# Patient Record
Sex: Female | Born: 1990 | Race: White | Hispanic: No | Marital: Married | Smoking: Former smoker
Health system: Southern US, Community
[De-identification: ages and names within clinical notes are randomized; demographics above are authoritative.]

## PROBLEM LIST (undated history)

## (undated) DIAGNOSIS — F32A Depression, unspecified: Secondary | ICD-10-CM

## (undated) DIAGNOSIS — F419 Anxiety disorder, unspecified: Secondary | ICD-10-CM

## (undated) DIAGNOSIS — F909 Attention-deficit hyperactivity disorder, unspecified type: Secondary | ICD-10-CM

## (undated) DIAGNOSIS — F329 Major depressive disorder, single episode, unspecified: Secondary | ICD-10-CM

## (undated) DIAGNOSIS — N1370 Vesicoureteral-reflux, unspecified: Secondary | ICD-10-CM

## (undated) DIAGNOSIS — N390 Urinary tract infection, site not specified: Secondary | ICD-10-CM

## (undated) DIAGNOSIS — G43909 Migraine, unspecified, not intractable, without status migrainosus: Secondary | ICD-10-CM

## (undated) HISTORY — DX: Urinary tract infection, site not specified: N39.0

## (undated) HISTORY — DX: Vesicoureteral-reflux, unspecified: N13.70

## (undated) HISTORY — DX: Anxiety disorder, unspecified: F41.9

## (undated) HISTORY — DX: Attention-deficit hyperactivity disorder, unspecified type: F90.9

## (undated) HISTORY — DX: Migraine, unspecified, not intractable, without status migrainosus: G43.909

## (undated) HISTORY — DX: Depression, unspecified: F32.A

## (undated) HISTORY — PX: CERVICAL BIOPSY  W/ LOOP ELECTRODE EXCISION: SUR135

## (undated) HISTORY — DX: Major depressive disorder, single episode, unspecified: F32.9

---
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
## 2015-02-01 ENCOUNTER — Encounter: Payer: Self-pay | Admitting: Emergency Medicine

## 2015-02-01 ENCOUNTER — Emergency Department
Admission: EM | Admit: 2015-02-01 | Discharge: 2015-02-01 | Disposition: A | Discharge status: Home / Self Care | Payer: Managed Care, Other (non HMO) | Attending: Emergency Medicine | Admitting: Emergency Medicine

## 2015-02-01 DIAGNOSIS — N390 Urinary tract infection, site not specified: Secondary | ICD-10-CM | POA: Insufficient documentation

## 2015-02-01 DIAGNOSIS — Z3202 Encounter for pregnancy test, result negative: Secondary | ICD-10-CM | POA: Insufficient documentation

## 2015-02-01 DIAGNOSIS — R1031 Right lower quadrant pain: Secondary | ICD-10-CM | POA: Diagnosis present

## 2015-02-01 LAB — URINALYSIS COMPLETE WITH MICROSCOPIC (ARMC ONLY)
Bilirubin Urine: NEGATIVE
GLUCOSE, UA: NEGATIVE mg/dL
Ketones, ur: NEGATIVE mg/dL
Leukocytes, UA: NEGATIVE
Nitrite: POSITIVE — AB
Protein, ur: NEGATIVE mg/dL
Specific Gravity, Urine: 1.017 (ref 1.005–1.030)
pH: 6 (ref 5.0–8.0)

## 2015-02-01 LAB — POCT PREGNANCY, URINE: PREG TEST UR: NEGATIVE

## 2015-02-01 MED ORDER — HYDROCODONE-ACETAMINOPHEN 5-325 MG PO TABS
1.0000 | ORAL_TABLET | Freq: Once | ORAL | Status: AC
  Administered 2015-02-01: 1 via ORAL
  Filled 2015-02-01: qty 1

## 2015-02-01 MED ORDER — CEPHALEXIN 500 MG PO CAPS: 500.0000 mg | ORAL_CAPSULE | Freq: Three times a day (TID) | ORAL | Status: DC

## 2015-02-01 MED ORDER — SODIUM CHLORIDE 0.9 % IV BOLUS (SEPSIS)
1000.0000 mL | Freq: Once | INTRAVENOUS | Status: AC
  Administered 2015-02-01: 1000 mL via INTRAVENOUS

## 2015-02-01 MED ORDER — PHENAZOPYRIDINE HCL 200 MG PO TABS
ORAL_TABLET | ORAL | Status: AC
  Administered 2015-02-01: 200 mg via ORAL
  Filled 2015-02-01: qty 1

## 2015-02-01 MED ORDER — PHENAZOPYRIDINE HCL 200 MG PO TABS: 200.0000 mg | ORAL_TABLET | Freq: Three times a day (TID) | ORAL | Status: AC | PRN

## 2015-02-01 MED ORDER — PHENAZOPYRIDINE HCL 100 MG PO TABS
100.0000 mg | ORAL_TABLET | Freq: Once | ORAL | Status: DC
  Filled 2015-02-01: qty 1

## 2015-02-01 MED ORDER — DEXTROSE 5 % IV SOLN
1.0000 g | Freq: Once | INTRAVENOUS | Status: AC
  Administered 2015-02-01: 1 g via INTRAVENOUS
  Filled 2015-02-01: qty 10

## 2015-02-01 MED ORDER — PHENAZOPYRIDINE HCL 200 MG PO TABS
200.0000 mg | ORAL_TABLET | Freq: Once | ORAL | Status: AC
  Administered 2015-02-01: 200 mg via ORAL

## 2015-02-01 NOTE — Discharge Instructions (Signed)
Please seek medical attention for any high fevers, chest pain, shortness of breath, change in behavior, persistent vomiting, bloody stool or any other new or concerning symptoms. ° ° °Urinary Tract Infection °Urinary tract infections (UTIs) can develop anywhere along your urinary tract. Your urinary tract is your body's drainage system for removing wastes and extra water. Your urinary tract includes two kidneys, two ureters, a bladder, and a urethra. Your kidneys are a pair of bean-shaped organs. Each kidney is about the size of your fist. They are located below your ribs, one on each side of your spine. °CAUSES °Infections are caused by microbes, which are microscopic organisms, including fungi, viruses, and bacteria. These organisms are so small that they can only be seen through a microscope. Bacteria are the microbes that most commonly cause UTIs. °SYMPTOMS  °Symptoms of UTIs may vary by age and gender of the patient and by the location of the infection. Symptoms in young women typically include a frequent and intense urge to urinate and a painful, burning feeling in the bladder or urethra during urination. Older women and men are more likely to be tired, shaky, and weak and have muscle aches and abdominal pain. A fever may mean the infection is in your kidneys. Other symptoms of a kidney infection include pain in your back or sides below the ribs, nausea, and vomiting. °DIAGNOSIS °To diagnose a UTI, your caregiver will ask you about your symptoms. Your caregiver will also ask you to provide a urine sample. The urine sample will be tested for bacteria and white blood cells. White blood cells are made by your body to help fight infection. °TREATMENT  °Typically, UTIs can be treated with medication. Because most UTIs are caused by a bacterial infection, they usually can be treated with the use of antibiotics. The choice of antibiotic and length of treatment depend on your symptoms and the type of bacteria causing  your infection. °HOME CARE INSTRUCTIONS °· If you were prescribed antibiotics, take them exactly as your caregiver instructs you. Finish the medication even if you feel better after you have only taken some of the medication. °· Drink enough water and fluids to keep your urine clear or pale yellow. °· Avoid caffeine, tea, and carbonated beverages. They tend to irritate your bladder. °· Empty your bladder often. Avoid holding urine for long periods of time. °· Empty your bladder before and after sexual intercourse. °· After a bowel movement, women should cleanse from front to back. Use each tissue only once. °SEEK MEDICAL CARE IF:  °· You have back pain. °· You develop a fever. °· Your symptoms do not begin to resolve within 3 days. °SEEK IMMEDIATE MEDICAL CARE IF:  °· You have severe back pain or lower abdominal pain. °· You develop chills. °· You have nausea or vomiting. °· You have continued burning or discomfort with urination. °MAKE SURE YOU:  °· Understand these instructions. °· Will watch your condition. °· Will get help right away if you are not doing well or get worse. °  °This information is not intended to replace advice given to you by your health care provider. Make sure you discuss any questions you have with your health care provider. °  °Document Released: 01/17/2005 Document Revised: 12/29/2014 Document Reviewed: 05/18/2011 °Elsevier Interactive Patient Education ©2016 [employer] ° °

## 2015-02-01 NOTE — ED Notes (Signed)
MD at bedside. 

## 2015-02-01 NOTE — ED Provider Notes (Signed)
[facility] Emergency Department Provider Note   ____________________________________________  Time seen: 1315  I have reviewed the triage vital signs and the nursing notes.   HISTORY  Chief Complaint Abdominal Pain   History limited by: Not Limited   HPI Eileen Davis is a 24 y.o. female who presents to the emergency department today because of right sided lower abdominal pain. Patient states the pain started yesterday morning and has gradually gotten worse. She describes it as a sharp pain. It is worse with ambulation or lying flat. She took an aleve yesterday with minimal relief. No fevers. Has had a bad odor to her urine. History of ureteral reflux on the right side. Went to [facility] where blood work showed leukocytosis and urine showed positive nitrite and WBC.   History reviewed. No pertinent past medical history.  There are no active problems to display for this patient.   History reviewed. No pertinent past surgical history.  No current outpatient prescriptions on file.  Allergies Imitrex  No family history on file.  Social History Social History  Substance Use Topics  . Smoking status: Never Smoker   . Smokeless tobacco: None  . Alcohol Use: Yes    Review of Systems  Constitutional: Negative for fever. Cardiovascular: Negative for chest pain. Respiratory: Negative for shortness of breath. Gastrointestinal: Right lower quadrant pain Genitourinary: Negative for dysuria. Musculoskeletal: Negative for back pain. Skin: Negative for rash. Neurological: Negative for headaches, focal weakness or numbness.  10-point ROS otherwise negative.  ____________________________________________   PHYSICAL EXAM:  VITAL SIGNS: ED Triage Vitals  Enc Vitals Group     BP 02/01/15 1213 124/93 mmHg     Pulse Rate 02/01/15 1213 101     Resp 02/01/15 1213 20     Temp 02/01/15 1213 98.5 F (36.9 C)     Temp src --      SpO2  02/01/15 1213 98 %     Weight 02/01/15 1213 110 lb (49.896 kg)     Height 02/01/15 1213 5\' 2"  (1.575 m)     Head Cir --      Peak Flow --      Pain Score 02/01/15 1211 8   Constitutional: Alert and oriented. Well appearing and in no distress. Eyes: Conjunctivae are normal. PERRL. Normal extraocular movements. ENT   Head: Normocephalic and atraumatic.   Nose: No congestion/rhinnorhea.   Mouth/Throat: Mucous membranes are moist.   Neck: No stridor. Hematological/Lymphatic/Immunilogical: No cervical lymphadenopathy. Cardiovascular: Normal rate, regular rhythm.  No murmurs, rubs, or gallops. Respiratory: Normal respiratory effort without tachypnea nor retractions. Breath sounds are clear and equal bilaterally. No wheezes/rales/rhonchi. Gastrointestinal: Soft and minimally tender to palpation in the RLQ. Negative rovsings. No rebound. No guarding. Genitourinary: Deferred Musculoskeletal: Normal range of motion in all extremities. No joint effusions.  No lower extremity tenderness nor edema. Neurologic:  Normal speech and language. No gross focal neurologic deficits are appreciated. Speech is normal.  Skin:  Skin is warm, dry and intact. No rash noted. Psychiatric: Mood and affect are normal. Speech and behavior are normal. Patient exhibits appropriate insight and judgment.  ____________________________________________    LABS (pertinent positives/negatives)  Labs Reviewed  URINALYSIS COMPLETEWITH MICROSCOPIC ([facility] ONLY) - Abnormal; Notable for the following:    Color, Urine YELLOW (*)    APPearance HAZY (*)    Hgb urine dipstick 2+ (*)    Nitrite POSITIVE (*)    Bacteria, UA RARE (*)    Squamous Epithelial / LPF 0-5 (*)  All other components within normal limits  URINE CULTURE  POCT PREGNANCY, URINE     ____________________________________________   EKG  None  ____________________________________________     RADIOLOGY  None    ____________________________________________   PROCEDURES  Procedure(s) performed: None  Critical Care performed: No  ____________________________________________   INITIAL IMPRESSION / ASSESSMENT AND PLAN / ED COURSE  Pertinent labs & imaging results that were available during my care of the patient were reviewed by me and considered in my medical decision making (see chart for details).  Patient presented to the emergency department today from pediatrician because of concerns for right-sided abdominal pain. Lab work did show urine positive for nitrites and white blood cells. Patient does have a history of ureteral reflux on that side. Given these findings is that I think patient likely has a urinary tract infection. I did have a discussion with the patient and family about possibility of appendicitis however at this point given the positive urine findings including positive nitrite I think unlikely patient also has appendicitis. I did reexamine the patient prior to discharging the patient had less tenderness than on initial exam. Still without any Rovsings sign rebound or guarding. I did discuss strict appendicitis return precautions with the family and patient.  ____________________________________________   FINAL CLINICAL IMPRESSION(S) / ED DIAGNOSES  Final diagnoses:  UTI (lower urinary tract infection)     Nance Pear, MD 02/01/15 1818

## 2015-02-01 NOTE — ED Notes (Addendum)
Sent in from [place] with right sided abd . Had elevated wbc from office  States pain increases with ambulation

## 2015-02-03 LAB — URINE CULTURE: Culture: >=100000

## 2015-02-04 ENCOUNTER — Telehealth: Payer: Self-pay | Admitting: Emergency Medicine

## 2015-02-04 NOTE — ED Notes (Signed)
Attempted speak to a pharmacist at [facility] , was on hold for 25 min , left a message on there voice mail with information regarding the RX

## 2015-02-04 NOTE — ED Provider Notes (Addendum)
-----------------------------------------   5:36 PM on 02/04/2015 -----------------------------------------  The pharmacist brought to my attention this patient's urine culture which was obtained several days ago and is notable for E. Coli ESBL, resistant to penicillins, cephalosporins, and aztreonam.  However, it IS sensitive to Macrobid.  She was discharged with a 10 day course of Keflex.  Eileen Davis, the charge nurse, called the patient at home, and I spoke with her personally.  She states that she is feeling better since she was seen.  We gave her the option of coming back to the emergency department and possibly be admitted for IV antibiotics, or I suggested that, since she is established with a urologist, and since she is feeling better clinically, we could call in a prescription for Macrobid to her pharmacy which she should start taking immediately.  She prefers this option.  I encouraged her to follow up as soon as possible with her urologist to discuss the culture results and for further evaluation of her history of ureteral reflux.  She understands and agrees with the plan.  She also understands that she should return to the emergency department immediately if she worsens clinically.  I initially suggested that she continue taking the Keflex, but she prefers not to, and since we know the bacteria is resistant to cephalosporins, I told her that it is okay if she discontinues it.  Eileen Davis is calling the pharmacy to call in the following prescription at my request:  Nitrofurantoin 100 mg capsules, take 1 capsule twice daily for 14 days.  Dispense 20 capsules, no refills.  Hinda Kehr, MD 02/04/15 1750

## 2015-03-01 DIAGNOSIS — N1370 Vesicoureteral-reflux, unspecified: Secondary | ICD-10-CM | POA: Insufficient documentation

## 2015-03-01 DIAGNOSIS — N390 Urinary tract infection, site not specified: Secondary | ICD-10-CM | POA: Insufficient documentation

## 2015-03-15 ENCOUNTER — Ambulatory Visit (INDEPENDENT_AMBULATORY_CARE_PROVIDER_SITE_OTHER): Payer: Managed Care, Other (non HMO) | Admitting: Obstetrics and Gynecology

## 2015-03-15 ENCOUNTER — Encounter: Payer: Self-pay | Admitting: Obstetrics and Gynecology

## 2015-03-15 VITALS — BP 145/85 | HR 98 | Ht 61.0 in | Wt 112.8 lb

## 2015-03-15 DIAGNOSIS — Z01419 Encounter for gynecological examination (general) (routine) without abnormal findings: Secondary | ICD-10-CM

## 2015-03-15 DIAGNOSIS — Z124 Encounter for screening for malignant neoplasm of cervix: Secondary | ICD-10-CM | POA: Diagnosis not present

## 2015-03-15 DIAGNOSIS — Z113 Encounter for screening for infections with a predominantly sexual mode of transmission: Secondary | ICD-10-CM

## 2015-03-15 NOTE — Progress Notes (Signed)
GYNECOLOGY ANNUAL EXAM CLINIC VISIT  Subjective:    Eileen Davis is a 24 y.o. P0 female who presents for an annual exam. The patient is sexually active, currently 1 partner. GYN screening history: no prior history of gyn screening tests. The patient wears seatbelts: yes. The patient participates in regular exercise: no. Has the patient ever been transfused or tattooed?: no. The patient reports that there is not domestic violence in her life.   The patient has the following complaints today: 1) Has h/o recurrent UTIs due to vesicourethral reflux (right sided), started on Macrobid suppression ~ 1.5 months ago.  Notes that it is interfering with her  OCPs as she is now noting mid-cycle cramping, anxiety/depressive symptoms (previously well managed with OCPs).  Also concerned about effectiveness of OCP with current antibiotic and risk of pregnancy.  Menstrual History: OB History    Gravida Para Term Preterm AB TAB SAB Ectopic Multiple Living   0 0 0 0 0 0 0 0 0 0       Menarche age: 76  Patient's last menstrual period was 03/01/2015. Period Duration (Days): 3-4 Period Pattern: Regular Menstrual Flow: Light Dysmenorrhea: (!) Mild Dysmenorrhea Symptoms: Cramping, Nausea, Headache   Past Medical History  Diagnosis Date  . Depression   . Migraine   . Ureteral reflux 2005    Right side  . Anxiety and depression   . Recurrent UTI     History reviewed. No pertinent past surgical history.  Family History  Problem Relation Age of Onset  . Osteoporosis Mother   . Diabetes Father   . Hypertension Father     Social History   Social History  . Marital Status: Single    Spouse Name: N/A  . Number of Children: N/A  . Years of Education: N/A   Occupational History  . Not on file.   Social History Main Topics  . Smoking status: Never Smoker   . Smokeless tobacco: Not on file  . Alcohol Use: Yes  . Drug Use: No  . Sexual Activity: Yes    Birth Control/ Protection: Pill    Other Topics Concern  . Not on file   Social History Narrative    Outpatient Encounter Prescriptions as of 03/15/2015  Medication Sig Note  . FLUARIX QUADRIVALENT 0.5 ML injection TO BE ADMINISTERED BY PHARMACIST FOR IMMUNIZATION 03/15/2015: Received from: External Pharmacy  . nitrofurantoin, macrocrystal-monohydrate, (MACROBID) 100 MG capsule Take 100 mg by mouth. 03/15/2015: Received from: [facility]  . norgestimate-ethinyl estradiol (PREVIFEM) 0.25-35 MG-MCG tablet Take by mouth. 03/15/2015: Received from: [facility]  . phenazopyridine (PYRIDIUM) 200 MG tablet Take 1 tablet (200 mg total) by mouth 3 (three) times daily as needed for pain.    No facility-administered encounter medications on file as of 03/15/2015.    Allergies  Allergen Reactions  . Imitrex [Sumatriptan] Other (See Comments)    Had syncopal episode  . Sumatriptan Succinate Other (See Comments)    Had syncopal episode    Review of Systems Constitutional: negative for chills, fatigue, fevers and sweats Eyes: negative for irritation, redness and visual disturbance Ears, nose, mouth, throat, and face: negative for hearing loss, nasal congestion, snoring and tinnitus Respiratory: negative for asthma, cough, sputum Cardiovascular: negative for chest pain, dyspnea, exertional chest pressure/discomfort, irregular heart beat, palpitations and syncope Gastrointestinal: negative for abdominal pain, change in bowel habits, nausea and vomiting Genitourinary: positive for mid-cycle cramping, recurrent UTIs, ; negative for abnormal menstrual periods, genital lesions, sexual problems and vaginal  discharge, dysuria and urinary incontinence Integument/breast: negative for breast lump, breast tenderness and nipple discharge Hematologic/lymphatic: negative for bleeding and easy bruising Musculoskeletal:negative for back pain and muscle weakness Neurological: negative for dizziness, headaches, vertigo and  weakness Endocrine: negative for diabetic symptoms including polydipsia, polyuria and skin dryness Allergic/Immunologic: negative for hay fever and urticaria   Psychiatric: positive for anxiety and depressive symptoms, associated with menses; negative for - concentration difficulties, decreased libido, hallucinations or memory difficulties   Objective:    BP 145/85 mmHg  Pulse 98  Ht 5\' 1"  (1.549 m)  Wt 112 lb 12.8 oz (51.166 kg)  BMI 21.32 kg/m2  LMP 03/01/2015.    General Appearance:    Alert, cooperative, no distress, appears stated age  Head:    Normocephalic, without obvious abnormality, atraumatic  Eyes:    PERRL, conjunctiva/corneas clear, EOM's intact, both eyes  Ears:    Normal external ear canals, both ears  Nose:   Nares normal, septum midline, mucosa normal, no drainage or sinus tenderness  Throat:   Lips, mucosa, and tongue normal; teeth and gums normal  Neck:   Supple, symmetrical, trachea midline, no adenopathy; thyroid: no enlargement/tenderness/nodules; no carotid bruit or JVD  Back:     Symmetric, no curvature, ROM normal, no CVA tenderness  Lungs:     Clear to auscultation bilaterally, respirations unlabored  Chest Wall:    No tenderness or deformity   Heart:    Regular rate and rhythm, S1 and S2 normal, no murmur, rub or gallop  Breast Exam:    No tenderness, masses, or nipple abnormality  Abdomen:     Soft, non-tender, bowel sounds active all four quadrants, no masses, no organomegaly.    Genitalia:    Pelvic:external genitalia normal, vagina with outlesions, discharge, or tenderness, rectovaginal septum  normal. Cervix normal in appearance, no cervical motion tenderness, no adnexal masses or tenderness.  Uterus normal size, shape, consistency.    Rectal:    Normal external sphincter.  No hemorrhoids appreciated. Internal exam not done.   Extremities:   Extremities normal, atraumatic, no cyanosis or edema  Pulses:   2+ and symmetric all extremities  Skin:   Skin  color, texture, turgor normal, no rashes or lesions  Lymph nodes:   Cervical, supraclavicular, and axillary nodes normal  Neurologic:   CNII-XII intact, normal strength, sensation and reflexes throughout    Assessment:    Healthy female exam.   H/o vesicoureteral reflux, currently with recurrent UTIs Contraception - OCPs H/o anxiety/depression   Plan:     Blood tests: CBC with diff, Basic metabolic panel, and STD panel. Contraception: OCP (estrogen/progesterone).  However patient notes interaction between antibiotics for recurrent UTIs and current OCP.  Advised patient to notify her provider and see if a different antibiotic could be given, as patient notes that her menstrual mood symptoms have been managed well up until now.  Also advised on use of back up method of contraception as some antibiotics have been known to cause OCPs to be less effective.  If patient still with similar symptoms after changing antibiotics, may need to consider alternative methods of contraception that do not require oral metabolism. Discussed healthy lifestyle modifications. Pap smear performed.  STD testing desired, ordered.  Up to date with vaccinations, including flu.  RTC in 1 year.    Rubie Maid, MD [facility]

## 2015-03-15 NOTE — Patient Instructions (Signed)
Preventive Care for Adults, Female A healthy lifestyle and preventive care can promote health and wellness. Preventive health guidelines for women include the following key practices.  A routine yearly physical is a good way to check with your health care provider about your health and preventive screening. It is a chance to share any concerns and updates on your health and to receive a thorough exam.  Visit your dentist for a routine exam and preventive care every 6 months. Brush your teeth twice a day and floss once a day. Good oral hygiene prevents tooth decay and gum disease.  The frequency of eye exams is based on your age, health, family medical history, use of contact lenses, and other factors. Follow your health care provider's recommendations for frequency of eye exams.  Eat a healthy diet. Foods like vegetables, fruits, whole grains, low-fat dairy products, and lean protein foods contain the nutrients you need without too many calories. Decrease your intake of foods high in solid fats, added sugars, and salt. Eat the right amount of calories for you.Get information about a proper diet from your health care provider, if necessary.  Regular physical exercise is one of the most important things you can do for your health. Most adults should get at least 150 minutes of moderate-intensity exercise (any activity that increases your heart rate and causes you to sweat) each week. In addition, most adults need muscle-strengthening exercises on 2 or more days a week.  Maintain a healthy weight. The body mass index (BMI) is a screening tool to identify possible weight problems. It provides an estimate of body fat based on height and weight. Your health care provider can find your BMI and can help you achieve or maintain a healthy weight.For adults 20 years and older:  A BMI below 18.5 is considered underweight.  A BMI of 18.5 to 24.9 is normal.  A BMI of 25 to 29.9 is considered  overweight.  A BMI of 30 and above is considered obese.  Maintain normal blood lipids and cholesterol levels by exercising and minimizing your intake of saturated fat. Eat a balanced diet with plenty of fruit and vegetables. Blood tests for lipids and cholesterol should begin at age 64 and be repeated every 5 years. If your lipid or cholesterol levels are high, you are over 50, or you are at high risk for heart disease, you may need your cholesterol levels checked more frequently.Ongoing high lipid and cholesterol levels should be treated with medicines if diet and exercise are not working.  If you smoke, find out from your health care provider how to quit. If you do not use tobacco, do not start.  Lung cancer screening is recommended for adults aged 52-80 years who are at high risk for developing lung cancer because of a history of smoking. A yearly low-dose CT scan of the lungs is recommended for people who have at least a 30-pack-year history of smoking and are a current smoker or have quit within the past 15 years. A pack year of smoking is smoking an average of 1 pack of cigarettes a day for 1 year (for example: 1 pack a day for 30 years or 2 packs a day for 15 years). Yearly screening should continue until the smoker has stopped smoking for at least 15 years. Yearly screening should be stopped for people who develop a health problem that would prevent them from having lung cancer treatment.  If you are pregnant, do not drink alcohol. If you are  breastfeeding, be very cautious about drinking alcohol. If you are not pregnant and choose to drink alcohol, do not have more than 1 drink per day. One drink is considered to be 12 ounces (355 mL) of beer, 5 ounces (148 mL) of wine, or 1.5 ounces (44 mL) of liquor.  Avoid use of street drugs. Do not share needles with anyone. Ask for help if you need support or instructions about stopping the use of drugs.  High blood pressure causes heart disease and  increases the risk of stroke. Your blood pressure should be checked at least every 1 to 2 years. Ongoing high blood pressure should be treated with medicines if weight loss and exercise do not work.  If you are 25-78 years old, ask your health care provider if you should take aspirin to prevent strokes.  Diabetes screening is done by taking a blood sample to check your blood glucose level after you have not eaten for a certain period of time (fasting). If you are not overweight and you do not have risk factors for diabetes, you should be screened once every 3 years starting at age 86. If you are overweight or obese and you are 3-87 years of age, you should be screened for diabetes every year as part of your cardiovascular risk assessment.  Breast cancer screening is essential preventive care for women. You should practice "breast self-awareness." This means understanding the normal appearance and feel of your breasts and may include breast self-examination. Any changes detected, no matter how small, should be reported to a health care provider. Women in their 66s and 30s should have a clinical breast exam (CBE) by a health care provider as part of a regular health exam every 1 to 3 years. After age 43, women should have a CBE every year. Starting at age 37, women should consider having a mammogram (breast X-ray test) every year. Women who have a family history of breast cancer should talk to their health care provider about genetic screening. Women at a high risk of breast cancer should talk to their health care providers about having an MRI and a mammogram every year.  Breast cancer gene (BRCA)-related cancer risk assessment is recommended for women who have family members with BRCA-related cancers. BRCA-related cancers include breast, ovarian, tubal, and peritoneal cancers. Having family members with these cancers may be associated with an increased risk for harmful changes (mutations) in the breast  cancer genes BRCA1 and BRCA2. Results of the assessment will determine the need for genetic counseling and BRCA1 and BRCA2 testing.  Your health care provider may recommend that you be screened regularly for cancer of the pelvic organs (ovaries, uterus, and vagina). This screening involves a pelvic examination, including checking for microscopic changes to the surface of your cervix (Pap test). You may be encouraged to have this screening done every 3 years, beginning at age 78.  For women ages 79-65, health care providers may recommend pelvic exams and Pap testing every 3 years, or they may recommend the Pap and pelvic exam, combined with testing for human papilloma virus (HPV), every 5 years. Some types of HPV increase your risk of cervical cancer. Testing for HPV may also be done on women of any age with unclear Pap test results.  Other health care providers may not recommend any screening for nonpregnant women who are considered low risk for pelvic cancer and who do not have symptoms. Ask your health care provider if a screening pelvic exam is right for  you.  If you have had past treatment for cervical cancer or a condition that could lead to cancer, you need Pap tests and screening for cancer for at least 20 years after your treatment. If Pap tests have been discontinued, your risk factors (such as having a new sexual partner) need to be reassessed to determine if screening should resume. Some women have medical problems that increase the chance of getting cervical cancer. In these cases, your health care provider may recommend more frequent screening and Pap tests.  Colorectal cancer can be detected and often prevented. Most routine colorectal cancer screening begins at the age of 50 years and continues through age 75 years. However, your health care provider may recommend screening at an earlier age if you have risk factors for colon cancer. On a yearly basis, your health care provider may provide  home test kits to check for hidden blood in the stool. Use of a small camera at the end of a tube, to directly examine the colon (sigmoidoscopy or colonoscopy), can detect the earliest forms of colorectal cancer. Talk to your health care provider about this at age 50, when routine screening begins. Direct exam of the colon should be repeated every 5-10 years through age 75 years, unless early forms of precancerous polyps or small growths are found.  People who are at an increased risk for hepatitis B should be screened for this virus. You are considered at high risk for hepatitis B if:  You were born in a country where hepatitis B occurs often. Talk with your health care provider about which countries are considered high risk.  Your parents were born in a high-risk country and you have not received a shot to protect against hepatitis B (hepatitis B vaccine).  You have HIV or AIDS.  You use needles to inject street drugs.  You live with, or have sex with, someone who has hepatitis B.  You get hemodialysis treatment.  You take certain medicines for conditions like cancer, organ transplantation, and autoimmune conditions.  Hepatitis C blood testing is recommended for all people born from 1945 through 1965 and any individual with known risks for hepatitis C.  Practice safe sex. Use condoms and avoid high-risk sexual practices to reduce the spread of sexually transmitted infections (STIs). STIs include gonorrhea, chlamydia, syphilis, trichomonas, herpes, HPV, and human immunodeficiency virus (HIV). Herpes, HIV, and HPV are viral illnesses that have no cure. They can result in disability, cancer, and death.  You should be screened for sexually transmitted illnesses (STIs) including gonorrhea and chlamydia if:  You are sexually active and are younger than 24 years.  You are older than 24 years and your health care provider tells you that you are at risk for this type of infection.  Your sexual  activity has changed since you were last screened and you are at an increased risk for chlamydia or gonorrhea. Ask your health care provider if you are at risk.  If you are at risk of being infected with HIV, it is recommended that you take a prescription medicine daily to prevent HIV infection. This is called preexposure prophylaxis (PrEP). You are considered at risk if:  You are sexually active and do not regularly use condoms or know the HIV status of your partner(s).  You take drugs by injection.  You are sexually active with a partner who has HIV.  Talk with your health care provider about whether you are at high risk of being infected with HIV. If   you choose to begin PrEP, you should first be tested for HIV. You should then be tested every 3 months for as long as you are taking PrEP.  Osteoporosis is a disease in which the bones lose minerals and strength with aging. This can result in serious bone fractures or breaks. The risk of osteoporosis can be identified using a bone density scan. Women ages 1 years and over and women at risk for fractures or osteoporosis should discuss screening with their health care providers. Ask your health care provider whether you should take a calcium supplement or vitamin D to reduce the rate of osteoporosis.  Menopause can be associated with physical symptoms and risks. Hormone replacement therapy is available to decrease symptoms and risks. You should talk to your health care provider about whether hormone replacement therapy is right for you.  Use sunscreen. Apply sunscreen liberally and repeatedly throughout the day. You should seek shade when your shadow is shorter than you. Protect yourself by wearing long sleeves, pants, a wide-brimmed hat, and sunglasses year round, whenever you are outdoors.  Once a month, do a whole body skin exam, using a mirror to look at the skin on your back. Tell your health care provider of new moles, moles that have irregular  borders, moles that are larger than a pencil eraser, or moles that have changed in shape or color.  Stay current with required vaccines (immunizations).  Influenza vaccine. All adults should be immunized every year.  Tetanus, diphtheria, and acellular pertussis (Td, Tdap) vaccine. Pregnant women should receive 1 dose of Tdap vaccine during each pregnancy. The dose should be obtained regardless of the length of time since the last dose. Immunization is preferred during the 27th-36th week of gestation. An adult who has not previously received Tdap or who does not know her vaccine status should receive 1 dose of Tdap. This initial dose should be followed by tetanus and diphtheria toxoids (Td) booster doses every 10 years. Adults with an unknown or incomplete history of completing a 3-dose immunization series with Td-containing vaccines should begin or complete a primary immunization series including a Tdap dose. Adults should receive a Td booster every 10 years.  Varicella vaccine. An adult without evidence of immunity to varicella should receive 2 doses or a second dose if she has previously received 1 dose. Pregnant females who do not have evidence of immunity should receive the first dose after pregnancy. This first dose should be obtained before leaving the health care facility. The second dose should be obtained 4-8 weeks after the first dose.  Human papillomavirus (HPV) vaccine. Females aged 13-26 years who have not received the vaccine previously should obtain the 3-dose series. The vaccine is not recommended for use in pregnant females. However, pregnancy testing is not needed before receiving a dose. If a female is found to be pregnant after receiving a dose, no treatment is needed. In that case, the remaining doses should be delayed until after the pregnancy. Immunization is recommended for any person with an immunocompromised condition through the age of 24 years if she did not get any or all doses  earlier. During the 3-dose series, the second dose should be obtained 4-8 weeks after the first dose. The third dose should be obtained 24 weeks after the first dose and 16 weeks after the second dose.  Zoster vaccine. One dose is recommended for adults aged 97 years or older unless certain conditions are present.  Measles, mumps, and rubella (MMR) vaccine. Adults born  before 1957 generally are considered immune to measles and mumps. Adults born in [birth year] or later should have 1 or more doses of MMR vaccine unless there is a contraindication to the vaccine or there is laboratory evidence of immunity to each of the three diseases. A routine second dose of MMR vaccine should be obtained at least 28 days after the first dose for students attending postsecondary schools, health care workers, or international travelers. People who received inactivated measles vaccine or an unknown type of measles vaccine during 1963-1967 should receive 2 doses of MMR vaccine. People who received inactivated mumps vaccine or an unknown type of mumps vaccine before 1979 and are at high risk for mumps infection should consider immunization with 2 doses of MMR vaccine. For females of childbearing age, rubella immunity should be determined. If there is no evidence of immunity, females who are not pregnant should be vaccinated. If there is no evidence of immunity, females who are pregnant should delay immunization until after pregnancy. Unvaccinated health care workers born before 60 who lack laboratory evidence of measles, mumps, or rubella immunity or laboratory confirmation of disease should consider measles and mumps immunization with 2 doses of MMR vaccine or rubella immunization with 1 dose of MMR vaccine.  Pneumococcal 13-valent conjugate (PCV13) vaccine. When indicated, a person who is uncertain of his immunization history and has no record of immunization should receive the PCV13 vaccine. All adults 61 years of age and older  should receive this vaccine. An adult aged 92 years or older who has certain medical conditions and has not been previously immunized should receive 1 dose of PCV13 vaccine. This PCV13 should be followed with a dose of pneumococcal polysaccharide (PPSV23) vaccine. Adults who are at high risk for pneumococcal disease should obtain the PPSV23 vaccine at least 8 weeks after the dose of PCV13 vaccine. Adults older than 21 years of age who have normal immune system function should obtain the PPSV23 vaccine dose at least 1 year after the dose of PCV13 vaccine.  Pneumococcal polysaccharide (PPSV23) vaccine. When PCV13 is also indicated, PCV13 should be obtained first. All adults aged 2 years and older should be immunized. An adult younger than age 30 years who has certain medical conditions should be immunized. Any person who resides in a nursing home or long-term care facility should be immunized. An adult smoker should be immunized. People with an immunocompromised condition and certain other conditions should receive both PCV13 and PPSV23 vaccines. People with human immunodeficiency virus (HIV) infection should be immunized as soon as possible after diagnosis. Immunization during chemotherapy or radiation therapy should be avoided. Routine use of PPSV23 vaccine is not recommended for American Indians, [place] Natives, or people younger than 65 years unless there are medical conditions that require PPSV23 vaccine. When indicated, people who have unknown immunization and have no record of immunization should receive PPSV23 vaccine. One-time revaccination 5 years after the first dose of PPSV23 is recommended for people aged 19-64 years who have chronic kidney failure, nephrotic syndrome, asplenia, or immunocompromised conditions. People who received 1-2 doses of PPSV23 before age 44 years should receive another dose of PPSV23 vaccine at age 83 years or later if at least 5 years have passed since the previous dose. Doses  of PPSV23 are not needed for people immunized with PPSV23 at or after age 20 years.  Meningococcal vaccine. Adults with asplenia or persistent complement component deficiencies should receive 2 doses of quadrivalent meningococcal conjugate (MenACWY-D) vaccine. The doses should be obtained  at least 2 months apart. Microbiologists working with certain meningococcal bacteria, [place] recruits, people at risk during an outbreak, and people who travel to or live in countries with a high rate of meningitis should be immunized. A first-year college student up through age 28 years who is living in a residence hall should receive a dose if she did not receive a dose on or after her 16th birthday. Adults who have certain high-risk conditions should receive one or more doses of vaccine.  Hepatitis A vaccine. Adults who wish to be protected from this disease, have certain high-risk conditions, work with hepatitis A-infected animals, work in hepatitis A research labs, or travel to or work in countries with a high rate of hepatitis A should be immunized. Adults who were previously unvaccinated and who anticipate close contact with an international adoptee during the first 60 days after arrival in the [place] from a country with a high rate of hepatitis A should be immunized.  Hepatitis B vaccine. Adults who wish to be protected from this disease, have certain high-risk conditions, may be exposed to blood or other infectious body fluids, are household contacts or sex partners of hepatitis B positive people, are clients or workers in certain care facilities, or travel to or work in countries with a high rate of hepatitis B should be immunized.  Haemophilus influenzae type b (Hib) vaccine. A previously unvaccinated person with asplenia or sickle cell disease or having a scheduled splenectomy should receive 1 dose of Hib vaccine. Regardless of previous immunization, a recipient of a hematopoietic stem cell transplant  should receive a 3-dose series 6-12 months after her successful transplant. Hib vaccine is not recommended for adults with HIV infection. Preventive Services / Frequency Ages 71 to 87 years  Blood pressure check.** / Every 3-5 years.  Lipid and cholesterol check.** / Every 5 years beginning at age 1.  Clinical breast exam.** / Every 3 years for women in their 3s and 31s.  BRCA-related cancer risk assessment.** / For women who have family members with a BRCA-related cancer (breast, ovarian, tubal, or peritoneal cancers).  Pap test.** / Every 2 years from ages 50 through 86. Every 3 years starting at age 87 through age 7 or 75 with a history of 3 consecutive normal Pap tests.  HPV screening.** / Every 3 years from ages 59 through ages 35 to 6 with a history of 3 consecutive normal Pap tests.  Hepatitis C blood test.** / For any individual with known risks for hepatitis C.  Skin self-exam. / Monthly.  Influenza vaccine. / Every year.  Tetanus, diphtheria, and acellular pertussis (Tdap, Td) vaccine.** / Consult your health care provider. Pregnant women should receive 1 dose of Tdap vaccine during each pregnancy. 1 dose of Td every 10 years.  Varicella vaccine.** / Consult your health care provider. Pregnant females who do not have evidence of immunity should receive the first dose after pregnancy.  HPV vaccine. / 3 doses over 6 months, if 72 and younger. The vaccine is not recommended for use in pregnant females. However, pregnancy testing is not needed before receiving a dose.  Measles, mumps, rubella (MMR) vaccine.** / You need at least 1 dose of MMR if you were born in 1957 or later. You may also need a 2nd dose. For females of childbearing age, rubella immunity should be determined. If there is no evidence of immunity, females who are not pregnant should be vaccinated. If there is no evidence of immunity, females who are  pregnant should delay immunization until after  pregnancy.  Pneumococcal 13-valent conjugate (PCV13) vaccine.** / Consult your health care provider.  Pneumococcal polysaccharide (PPSV23) vaccine.** / 1 to 2 doses if you smoke cigarettes or if you have certain conditions.  Meningococcal vaccine.** / 1 dose if you are age 87 to 44 years and a Market researcher living in a residence hall, or have one of several medical conditions, you need to get vaccinated against meningococcal disease. You may also need additional booster doses.  Hepatitis A vaccine.** / Consult your health care provider.  Hepatitis B vaccine.** / Consult your health care provider.  Haemophilus influenzae type b (Hib) vaccine.** / Consult your health care provider. Ages 86 to 38 years  Blood pressure check.** / Every year.  Lipid and cholesterol check.** / Every 5 years beginning at age 49 years.  Lung cancer screening. / Every year if you are aged 71-80 years and have a 30-pack-year history of smoking and currently smoke or have quit within the past 15 years. Yearly screening is stopped once you have quit smoking for at least 15 years or develop a health problem that would prevent you from having lung cancer treatment.  Clinical breast exam.** / Every year after age 51 years.  BRCA-related cancer risk assessment.** / For women who have family members with a BRCA-related cancer (breast, ovarian, tubal, or peritoneal cancers).  Mammogram.** / Every year beginning at age 18 years and continuing for as long as you are in good health. Consult with your health care provider.  Pap test.** / Every 3 years starting at age 63 years through age 37 or 57 years with a history of 3 consecutive normal Pap tests.  HPV screening.** / Every 3 years from ages 41 years through ages 76 to 23 years with a history of 3 consecutive normal Pap tests.  Fecal occult blood test (FOBT) of stool. / Every year beginning at age 36 years and continuing until age 51 years. You may not need  to do this test if you get a colonoscopy every 10 years.  Flexible sigmoidoscopy or colonoscopy.** / Every 5 years for a flexible sigmoidoscopy or every 10 years for a colonoscopy beginning at age 36 years and continuing until age 35 years.  Hepatitis C blood test.** / For all people born from [birth year] through 1965 and any individual with known risks for hepatitis C.  Skin self-exam. / Monthly.  Influenza vaccine. / Every year.  Tetanus, diphtheria, and acellular pertussis (Tdap/Td) vaccine.** / Consult your health care provider. Pregnant women should receive 1 dose of Tdap vaccine during each pregnancy. 1 dose of Td every 10 years.  Varicella vaccine.** / Consult your health care provider. Pregnant females who do not have evidence of immunity should receive the first dose after pregnancy.  Zoster vaccine.** / 1 dose for adults aged 73 years or older.  Measles, mumps, rubella (MMR) vaccine.** / You need at least 1 dose of MMR if you were born in 1957 or later. You may also need a second dose. For females of childbearing age, rubella immunity should be determined. If there is no evidence of immunity, females who are not pregnant should be vaccinated. If there is no evidence of immunity, females who are pregnant should delay immunization until after pregnancy.  Pneumococcal 13-valent conjugate (PCV13) vaccine.** / Consult your health care provider.  Pneumococcal polysaccharide (PPSV23) vaccine.** / 1 to 2 doses if you smoke cigarettes or if you have certain conditions.  Meningococcal vaccine.** /  Consult your health care provider.  Hepatitis A vaccine.** / Consult your health care provider.  Hepatitis B vaccine.** / Consult your health care provider.  Haemophilus influenzae type b (Hib) vaccine.** / Consult your health care provider. Ages 80 years and over  Blood pressure check.** / Every year.  Lipid and cholesterol check.** / Every 5 years beginning at age 62 years.  Lung cancer  screening. / Every year if you are aged 32-80 years and have a 30-pack-year history of smoking and currently smoke or have quit within the past 15 years. Yearly screening is stopped once you have quit smoking for at least 15 years or develop a health problem that would prevent you from having lung cancer treatment.  Clinical breast exam.** / Every year after age 61 years.  BRCA-related cancer risk assessment.** / For women who have family members with a BRCA-related cancer (breast, ovarian, tubal, or peritoneal cancers).  Mammogram.** / Every year beginning at age 39 years and continuing for as long as you are in good health. Consult with your health care provider.  Pap test.** / Every 3 years starting at age 85 years through age 74 or 72 years with 3 consecutive normal Pap tests. Testing can be stopped between 65 and 70 years with 3 consecutive normal Pap tests and no abnormal Pap or HPV tests in the past 10 years.  HPV screening.** / Every 3 years from ages 55 years through ages 67 or 77 years with a history of 3 consecutive normal Pap tests. Testing can be stopped between 65 and 70 years with 3 consecutive normal Pap tests and no abnormal Pap or HPV tests in the past 10 years.  Fecal occult blood test (FOBT) of stool. / Every year beginning at age 81 years and continuing until age 22 years. You may not need to do this test if you get a colonoscopy every 10 years.  Flexible sigmoidoscopy or colonoscopy.** / Every 5 years for a flexible sigmoidoscopy or every 10 years for a colonoscopy beginning at age 67 years and continuing until age 22 years.  Hepatitis C blood test.** / For all people born from [birth year] through 1963 and any individual with known risks for hepatitis C.  Osteoporosis screening.** / A one-time screening for women ages 8 years and over and women at risk for fractures or osteoporosis.  Skin self-exam. / Monthly.  Influenza vaccine. / Every year.  Tetanus, diphtheria, and  acellular pertussis (Tdap/Td) vaccine.** / 1 dose of Td every 10 years.  Varicella vaccine.** / Consult your health care provider.  Zoster vaccine.** / 1 dose for adults aged 56 years or older.  Pneumococcal 13-valent conjugate (PCV13) vaccine.** / Consult your health care provider.  Pneumococcal polysaccharide (PPSV23) vaccine.** / 1 dose for all adults aged 15 years and older.  Meningococcal vaccine.** / Consult your health care provider.  Hepatitis A vaccine.** / Consult your health care provider.  Hepatitis B vaccine.** / Consult your health care provider.  Haemophilus influenzae type b (Hib) vaccine.** / Consult your health care provider. ** Family history and personal history of risk and conditions may change your health care provider's recommendations.   This information is not intended to replace advice given to you by your health care provider. Make sure you discuss any questions you have with your health care provider.   Document Released: 06/05/2001 Document Revised: 04/30/2014 Document Reviewed: 09/04/2010 Elsevier Interactive Patient Education [employer].

## 2015-03-16 ENCOUNTER — Telehealth: Payer: Self-pay | Admitting: Obstetrics and Gynecology

## 2015-03-16 DIAGNOSIS — Z3041 Encounter for surveillance of contraceptive pills: Secondary | ICD-10-CM

## 2015-03-16 LAB — CBC
Hematocrit: 40.6 % (ref 34.0–46.6)
Hemoglobin: 13.2 g/dL (ref 11.1–15.9)
MCH: 28.3 pg (ref 26.6–33.0)
MCHC: 32.5 g/dL (ref 31.5–35.7)
MCV: 87 fL (ref 79–97)
PLATELETS: 261 10*3/uL (ref 150–379)
RBC: 4.66 x10E6/uL (ref 3.77–5.28)
RDW: 14 % (ref 12.3–15.4)
WBC: 8.8 10*3/uL (ref 3.4–10.8)

## 2015-03-16 LAB — BASIC METABOLIC PANEL
BUN/Creatinine Ratio: 13 (ref 8–20)
BUN: 8 mg/dL (ref 6–20)
CO2: 26 mmol/L (ref 18–29)
Calcium: 9.2 mg/dL (ref 8.7–10.2)
Chloride: 103 mmol/L (ref 97–106)
Creatinine, Ser: 0.64 mg/dL (ref 0.57–1.00)
GFR, EST AFRICAN AMERICAN: 146 mL/min/{1.73_m2} (ref >59)
GFR, EST NON AFRICAN AMERICAN: 127 mL/min/{1.73_m2} (ref >59)
Glucose: 85 mg/dL (ref 65–99)
POTASSIUM: 3.9 mmol/L (ref 3.5–5.2)
SODIUM: 142 mmol/L (ref 136–144)

## 2015-03-16 MED ORDER — NORGESTIMATE-ETH ESTRADIOL 0.25-35 MG-MCG PO TABS: 1.0000 | ORAL_TABLET | Freq: Every day | ORAL | Status: DC

## 2015-03-16 NOTE — Telephone Encounter (Signed)
Refill sent in. Paperwork placed up front for pick up.

## 2015-03-16 NOTE — Telephone Encounter (Signed)
Patient called requesting a refill on her b/c. She was also inquiring about her paperwork, she wanted to know if it had been filled out yet.Thanks

## 2015-03-18 LAB — HIV ANTIBODY (ROUTINE TESTING W REFLEX): HIV Screen 4th Generation wRfx: NONREACTIVE

## 2015-03-19 LAB — PAP IG, CT-NG, RFX HPV ASCU: PAP SMEAR COMMENT: 0

## 2015-03-19 LAB — RPR QUALITATIVE: RPR: NONREACTIVE

## 2015-03-19 LAB — SPECIMEN STATUS REPORT

## 2015-03-19 LAB — HEPATITIS B SURFACE ANTIGEN: Hepatitis B Surface Ag: NEGATIVE

## 2015-03-21 NOTE — Telephone Encounter (Signed)
Left message to notify patient.

## 2015-06-02 DIAGNOSIS — N390 Urinary tract infection, site not specified: Secondary | ICD-10-CM | POA: Insufficient documentation

## 2015-10-07 DIAGNOSIS — F419 Anxiety disorder, unspecified: Secondary | ICD-10-CM | POA: Diagnosis not present

## 2015-10-07 DIAGNOSIS — D229 Melanocytic nevi, unspecified: Secondary | ICD-10-CM | POA: Diagnosis not present

## 2015-10-07 DIAGNOSIS — R5383 Other fatigue: Secondary | ICD-10-CM | POA: Diagnosis not present

## 2015-10-10 DIAGNOSIS — Z7689 Persons encountering health services in other specified circumstances: Secondary | ICD-10-CM | POA: Diagnosis not present

## 2015-12-06 ENCOUNTER — Emergency Department (HOSPITAL_COMMUNITY): Payer: 59

## 2015-12-06 ENCOUNTER — Encounter (HOSPITAL_COMMUNITY): Payer: Self-pay | Admitting: Emergency Medicine

## 2015-12-06 ENCOUNTER — Emergency Department (HOSPITAL_COMMUNITY)
Admission: EM | Admit: 2015-12-06 | Discharge: 2015-12-06 | Disposition: A | Discharge status: Home / Self Care | Payer: 59 | Attending: Emergency Medicine | Admitting: Emergency Medicine

## 2015-12-06 DIAGNOSIS — Z79899 Other long term (current) drug therapy: Secondary | ICD-10-CM | POA: Diagnosis not present

## 2015-12-06 DIAGNOSIS — N390 Urinary tract infection, site not specified: Secondary | ICD-10-CM | POA: Diagnosis not present

## 2015-12-06 DIAGNOSIS — R110 Nausea: Secondary | ICD-10-CM | POA: Diagnosis not present

## 2015-12-06 DIAGNOSIS — R1031 Right lower quadrant pain: Secondary | ICD-10-CM | POA: Diagnosis not present

## 2015-12-06 LAB — COMPREHENSIVE METABOLIC PANEL
ALBUMIN: 3.6 g/dL (ref 3.5–5.0)
ALK PHOS: 96 U/L (ref 38–126)
ALT: 20 U/L (ref 14–54)
AST: 22 U/L (ref 15–41)
Anion gap: 6 (ref 5–15)
BILIRUBIN TOTAL: 1 mg/dL (ref 0.3–1.2)
BUN: 7 mg/dL (ref 6–20)
CALCIUM: 8.9 mg/dL (ref 8.9–10.3)
CO2: 26 mmol/L (ref 22–32)
CREATININE: 0.75 mg/dL (ref 0.44–1.00)
Chloride: 104 mmol/L (ref 101–111)
GFR calc Af Amer: >60 mL/min (ref >60)
GFR calc non Af Amer: >60 mL/min (ref >60)
GLUCOSE: 111 mg/dL — AB (ref 65–99)
Potassium: 4 mmol/L (ref 3.5–5.1)
Sodium: 136 mmol/L (ref 135–145)
TOTAL PROTEIN: 7 g/dL (ref 6.5–8.1)

## 2015-12-06 LAB — URINALYSIS, ROUTINE W REFLEX MICROSCOPIC
Bilirubin Urine: NEGATIVE
GLUCOSE, UA: NEGATIVE mg/dL
Ketones, ur: 15 mg/dL — AB
NITRITE: POSITIVE — AB
PH: 6 (ref 5.0–8.0)
PROTEIN: NEGATIVE mg/dL
Specific Gravity, Urine: 1.025 (ref 1.005–1.030)

## 2015-12-06 LAB — CBC
HEMATOCRIT: 41.2 % (ref 36.0–46.0)
Hemoglobin: 13.5 g/dL (ref 12.0–15.0)
MCH: 28.7 pg (ref 26.0–34.0)
MCHC: 32.8 g/dL (ref 30.0–36.0)
MCV: 87.7 fL (ref 78.0–100.0)
Platelets: 182 10*3/uL (ref 150–400)
RBC: 4.7 MIL/uL (ref 3.87–5.11)
RDW: 12.6 % (ref 11.5–15.5)
WBC: 15.3 10*3/uL — ABNORMAL HIGH (ref 4.0–10.5)

## 2015-12-06 LAB — URINE MICROSCOPIC-ADD ON

## 2015-12-06 LAB — POC URINE PREG, ED: Preg Test, Ur: NEGATIVE

## 2015-12-06 LAB — LIPASE, BLOOD: Lipase: 18 U/L (ref 11–51)

## 2015-12-06 MED ORDER — FLUCONAZOLE 150 MG PO TABS: 150.0000 mg | ORAL_TABLET | Freq: Once | ORAL | 0 refills | Status: AC

## 2015-12-06 MED ORDER — ONDANSETRON HCL 4 MG PO TABS: 4.0000 mg | ORAL_TABLET | Freq: Four times a day (QID) | ORAL | 0 refills | Status: DC

## 2015-12-06 MED ORDER — IOPAMIDOL (ISOVUE-300) INJECTION 61%
INTRAVENOUS | Status: AC
  Administered 2015-12-06: 80 mL
  Filled 2015-12-06: qty 100

## 2015-12-06 MED ORDER — SODIUM CHLORIDE 0.9 % IV BOLUS (SEPSIS)
1000.0000 mL | Freq: Once | INTRAVENOUS | Status: AC
  Administered 2015-12-06: 1000 mL via INTRAVENOUS

## 2015-12-06 MED ORDER — ONDANSETRON HCL 4 MG/2ML IJ SOLN
4.0000 mg | Freq: Once | INTRAMUSCULAR | Status: AC
  Administered 2015-12-06: 4 mg via INTRAVENOUS
  Filled 2015-12-06: qty 2

## 2015-12-06 MED ORDER — NITROFURANTOIN MONOHYD MACRO 100 MG PO CAPS: 100.0000 mg | ORAL_CAPSULE | Freq: Two times a day (BID) | ORAL | 0 refills | Status: DC

## 2015-12-06 NOTE — Discharge Instructions (Signed)
Medications: Macrobid, Diflucan, Zofran  Treatment: Take Macrobid as prescribed for 7 days. Make sure to take this medication with food. Take Diflucan once for your yeast infection. If you experience symptoms 72 hours later, takes second tablet. Take Zofran every 6 hours as needed for nausea and vomiting. Make sure to drink plenty of water. You can take Tylenol or ibuprofen as prescribed over-the-counter for your pain and fever. You'll be called in 2-3 days if your urine culture is resistance to Macrobid and you need change in your antibiotic.  Follow-up: Please follow-up with your primary care provider in 2-3 days for follow-up and recheck of urine. Please follow-up with your urologist as soon as possible for follow-up and further evaluation. Please return to emergency department if you develop any new or worsening symptoms.

## 2015-12-06 NOTE — ED Triage Notes (Signed)
Pt. reports right lateral abdominal pain onset Friday last week with nausea , denies urinary discomfort , emesis or diarrhea , no fever or chills .

## 2015-12-06 NOTE — ED Provider Notes (Signed)
[facility] DEPT Provider Note   CSN: [id number] Arrival date & time: 12/06/15  0442     History   Chief Complaint Chief Complaint  Patient presents with  . Abdominal Pain    HPI Eileen Davis is a 25 y.o. 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 Patient states she felt herself reflux on Thursday and began having pain. Patient has had a temperature of 100.4 that began yesterday. Patient has had constant right lower quadrant and flank pain since onset. Her pain is worse with movement. Patient denies any urinary symptoms. Patient has associated nausea and decreased appetite, but no vomiting or diarrhea. Patient denies any abnormal vaginal discharge. Patient's LMP was last week (Monday-Thursday). Patient has taken Tylenol and ibuprofen for her pain and fever. She has also taken Pyridium twice. Patient has a rescue antibiotic from her urologist in [facility], Augmentin, which she took Sunday and Monday. Patient denies any chest pain, shortness of breath, other abdominal pain, urinary symptoms. Patient states her symptoms feel exactly like her symptoms in the past.  Patient reports that she often gets vaginal yeast infections following taking Augmentin, however she denies any abnormal vaginal discharge at this time.  HPI  Past Medical History:  Diagnosis Date  . Anxiety and depression   . Depression   . Migraine   . Recurrent UTI   . Ureteral reflux 2005   Right side    Patient Active Problem List   Diagnosis Date Noted  . Infection of urinary tract 03/01/2015  . 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 No pertinent surgical history.  OB History    Gravida Para Term Preterm AB Living   0 0 0 0 0 0   SAB TAB Ectopic Multiple Live Births   0 0 0 0         Home Medications    Prior to Admission medications   Medication Sig Start Date End Date Taking? Authorizing Provider    acetaminophen (TYLENOL) 500 MG tablet Take 1,000 mg by mouth every 6 (six) hours as needed for mild pain.    Yes Historical Provider, MD  amoxicillin-clavulanate (AUGMENTIN) 875-125 MG tablet Take 1 tablet by mouth daily.   Yes Historical Provider, MD  CALCIUM PO Take 1 tablet by mouth daily.   Yes Historical Provider, MD  Cholecalciferol (VITAMIN D PO) Take 1 tablet by mouth daily.   Yes Historical Provider, MD  ibuprofen (ADVIL,MOTRIN) 200 MG tablet Take 800 mg by mouth every 6 (six) hours as needed for mild pain.   Yes Historical Provider, MD  MAGNESIUM PO Take 1 tablet by mouth daily.   Yes Historical Provider, MD  Multiple Vitamins-Minerals (HAIR SKIN AND NAILS FORMULA) TABS Take 1 tablet by mouth daily.   Yes Historical Provider, MD  Multiple Vitamins-Minerals (ZINC PO) Take 1 tablet by mouth daily.   Yes Historical Provider, MD  norgestimate-ethinyl estradiol (PREVIFEM) 0.25-35 MG-MCG tablet Take 1 tablet by mouth daily. 03/16/15  Yes Rubie Maid, MD  sertraline (ZOLOFT) 50 MG tablet Take 50 mg by mouth daily. 11/01/15  Yes Historical Provider, MD  FLUARIX QUADRIVALENT 0.5 ML injection TO BE ADMINISTERED BY PHARMACIST FOR IMMUNIZATION 03/02/15   Historical Provider, MD  fluconazole (DIFLUCAN) 150 MG tablet Take 1 tablet (150 mg total) by mouth once. Take second tablet 72 hours later only if you are still experiencing symptoms. 12/06/15 12/06/15  Frederica Kuster, PA-C  nitrofurantoin, macrocrystal-monohydrate, (MACROBID) 100 MG capsule Take 1 capsule (100 mg total) by mouth 2 (two) times daily. 12/06/15   Eilee Schader M Jina Olenick, PA-C  ondansetron (ZOFRAN) 4 MG tablet Take 1 tablet (4 mg total) by mouth every 6 (six) hours. 12/06/15   Frederica Kuster, PA-C  phenazopyridine (PYRIDIUM) 200 MG tablet Take 1 tablet (200 mg total) by mouth 3 (three) times daily as needed for pain. Patient not taking: Reported on 12/06/2015 02/01/15 02/01/16  Nance Pear, MD    Family History Family History  Problem  Relation Age of Onset  . Osteoporosis Mother   . Diabetes Father   . Hypertension Father     Social History Social History  Substance Use Topics  . Smoking status: Never Smoker  . Smokeless tobacco: Not on file  . Alcohol use Yes     Allergies   Imitrex [sumatriptan] and Sumatriptan succinate   Review of Systems Review of Systems  Constitutional: Negative for chills and fever.  HENT: Negative for facial swelling and sore throat.   Respiratory: Negative for shortness of breath.   Cardiovascular: Negative for chest pain.  Gastrointestinal: Positive for abdominal pain. Negative for nausea and vomiting.  Genitourinary: Positive for flank pain (R). Negative for dysuria, vaginal bleeding and vaginal discharge.  Musculoskeletal: Negative for back pain.  Skin: Negative for rash and wound.  Neurological: Negative for headaches.  Psychiatric/Behavioral: The patient is not nervous/anxious.      Physical Exam Updated Vital Signs BP 109/77   Pulse 97   Temp 99.7 F (37.6 C) (Oral)   Resp 16   Ht 5\' 1"  (1.549 m)   Wt 48.1 kg   LMP 11/27/2015 (Approximate)   SpO2 100%   BMI 20.03 kg/m   Physical Exam  Constitutional: She appears well-developed and well-nourished. No distress.  HENT:  Head: Normocephalic and atraumatic.  Mouth/Throat: Oropharynx is clear and moist. No oropharyngeal exudate.  Eyes: Conjunctivae are normal. Pupils are equal, round, and reactive to light. Right eye exhibits no discharge. Left eye exhibits no discharge. No scleral icterus.  Neck: Normal range of motion. Neck supple. No thyromegaly present.  Cardiovascular: Normal rate, regular rhythm, normal heart sounds and intact distal pulses.  Exam reveals no gallop and no friction rub.   No murmur heard. Pulmonary/Chest: Effort normal and breath sounds normal. No stridor. No respiratory distress. She has no wheezes. She has no rales.  Abdominal: Soft. Bowel sounds are normal. She exhibits no distension.  There is tenderness in the right lower quadrant and suprapubic area. There is tenderness at McBurney's point. There is no rebound, no guarding, no CVA tenderness and negative Murphy's sign.    Musculoskeletal: She exhibits no edema.  Lymphadenopathy:    She has no cervical adenopathy.  Neurological: She is alert. Coordination normal.  Skin: Skin is warm and dry. No rash noted. She is not diaphoretic. No pallor.  Psychiatric: She has a normal mood and affect.  Nursing note and vitals reviewed.    ED Treatments / Results  Labs (all labs ordered are listed, but only abnormal results are displayed) Labs Reviewed  COMPREHENSIVE METABOLIC PANEL - Abnormal; Notable for the following:       Result Value   Glucose, Bld 111 (*)    All other components within normal limits  CBC - Abnormal; Notable for the following:    WBC 15.3 (*)    All other components within normal limits  URINALYSIS, ROUTINE W REFLEX MICROSCOPIC (NOT AT [facility]) - Abnormal; Notable  for the following:    Color, Urine ORANGE (*)    APPearance TURBID (*)    Hgb urine dipstick MODERATE (*)    Ketones, ur 15 (*)    Nitrite POSITIVE (*)    Leukocytes, UA SMALL (*)    All other components within normal limits  URINE MICROSCOPIC-ADD ON - Abnormal; Notable for the following:    Squamous Epithelial / LPF 6-30 (*)    Bacteria, UA MANY (*)    All other components within normal limits  URINE CULTURE  LIPASE, BLOOD  POC URINE PREG, ED    EKG  EKG Interpretation None       Radiology Ct Abdomen Pelvis W Contrast  Result Date: 12/06/2015 CLINICAL DATA:  Right lower quadrant pain for several days EXAM: CT ABDOMEN AND PELVIS WITH CONTRAST TECHNIQUE: Multidetector CT imaging of the abdomen and pelvis was performed using the standard protocol following bolus administration of intravenous contrast. CONTRAST:  62mL ISOVUE-300 IOPAMIDOL (ISOVUE-300) INJECTION 61% COMPARISON:  None. FINDINGS: Lower chest:  No acute findings.  Hepatobiliary: No masses or other significant abnormality. Pancreas: No mass, inflammatory changes, or other significant abnormality. Spleen: Within normal limits in size and appearance. Adrenals/Urinary Tract: The adrenals are within normal limits. A few scattered small renal cysts are noted. No obstructive changes are seen. Stomach/Bowel: No evidence of obstruction, inflammatory process, or abnormal fluid collections. The appendix is within normal limits. Vascular/Lymphatic: No pathologically enlarged lymph nodes. No evidence of abdominal aortic aneurysm. Reproductive: Scattered small ovarian cysts are noted. Other: Minimal amount of free pelvic fluid is noted on the right. This may be related to a ruptured ovarian cyst. Musculoskeletal:  No suspicious bone lesions identified. IMPRESSION: Normal-appearing appendix. Scattered small ovarian cystic changes. Minimal free fluid on the right within the pelvis. This may be related to recently ruptured ovarian cyst. Electronically Signed   By: Inez Catalina M.D.   On: 12/06/2015 08:42    Procedures Procedures (including critical care time)  Medications Ordered in ED Medications  sodium chloride 0.9 % bolus 1,000 mL (0 mLs Intravenous Stopped 12/06/15 0926)  ondansetron (ZOFRAN) injection 4 mg (4 mg Intravenous Given 12/06/15 0745)  iopamidol (ISOVUE-300) 61 % injection (80 mLs  Contrast Given 12/06/15 0814)     Initial Impression / Assessment and Plan / ED Course  I have reviewed the triage vital signs and the nursing notes.  Pertinent labs & imaging results that were available during my care of the patient were reviewed by me and considered in my medical decision making (see chart for details).  Clinical Course    6:52am I discussed with patient and her mother that we can assume that her symptoms are due to her VUR and we cannot rule out appendicitis without CT scan. They have agreed to go forward with CT abdomen pelvis. Patient declines IV pain  medicine at this time because she would not like any strong medicines for pain. Will wait for CT to treat pain PO. Will order IV Zofran 4mg .  CBC shows leukocytosis of 15,000. CMP shows glucose 111. Lipase 18. UA shows moderate hematuria, positive nitrites, small leukocytes, many bacteria, 6-30 squamous cells, yeast present. Patient took Pyridium making urine orange, nitrite-positive is questionable. Urine culture sent. CT abdomen pelvis shows normal appearing appendix, scattered small ovarian cystic changes, minimal free fluid on the right than the pelvis indicating possible recently ruptured ovarian cyst. I will treat patient's most likely urinary tract infection with Macrobid. Patient requested dose of IV antibiotics prior to  discharge, however due to patient's last urine culture, I believe this would increase her antibiotic resistance. I discussed this with the patient and her mother and they are in agreement with plan. They were told that they would be called if her current culture is resistant to Macrobid and she would need a change in antibiotic treatment. Patient also discharged with Diflucan and Zofran or nausea. Patient advised to take one Diflucan tablet today and one in 3 days if symptoms are present. Return precautions discussed. Patient to follow up with PCP and urologist as soon as possible for recheck and follow-up. Patient and mother understand and agree with plan. Patient vitals stable throughout ED course and discharged in satisfactory condition.  Final Clinical Impressions(s) / ED Diagnoses   Final diagnoses:  UTI (lower urinary tract infection)  Nausea    New Prescriptions Discharge Medication List as of 12/06/2015  9:19 AM    START taking these medications   Details  fluconazole (DIFLUCAN) 150 MG tablet Take 1 tablet (150 mg total) by mouth once. Take second tablet 72 hours later only if you are still experiencing symptoms., Starting Tue 12/06/2015, Print    nitrofurantoin,  macrocrystal-monohydrate, (MACROBID) 100 MG capsule Take 1 capsule (100 mg total) by mouth 2 (two) times daily., Starting Tue 12/06/2015, Print    ondansetron (ZOFRAN) 4 MG tablet Take 1 tablet (4 mg total) by mouth every 6 (six) hours., Starting Tue 12/06/2015, Print         Frederica Kuster, PA-C [id number]    David Glick, MD [id number]

## 2015-12-07 LAB — URINE CULTURE: Culture: 20000 — AB

## 2015-12-08 ENCOUNTER — Telehealth: Payer: Self-pay | Admitting: *Deleted

## 2015-12-08 NOTE — Telephone Encounter (Signed)
Post ED Visit - Positive Culture Follow-up  Culture report reviewed by antimicrobial stewardship pharmacist:  []  Elenor Quinones, Pharm.D. []  Heide Guile, Pharm.D., BCPS []  Parks Neptune, Pharm.D. []  Alycia Rossetti, Pharm.D., BCPS []  [place], [place].D., BCPS, AAHIVP []  Legrand Como, Pharm.D., BCPS, AAHIVP []  Milus Glazier, Pharm.D. []  Stephens November, [place].D. Andria Meuse, RPh  Positive urine culture Treated with Nitrofurnatoin Monohyd Macro, Fluconazole, Amoxicillin-PotClavulanate, organism sensitive to the same and no further patient follow-up is required at this time.  Harlon Flor [facility] 12/08/2015, 9:12 AM

## 2016-03-20 ENCOUNTER — Encounter: Payer: Managed Care, Other (non HMO) | Admitting: Obstetrics and Gynecology

## 2016-04-20 NOTE — Progress Notes (Signed)
GYNECOLOGY ANNUAL PHYSICAL EXAM PROGRESS NOTE  Subjective:    Eileen Davis is a 25 y.o. G0P0 female who presents for an annual exam. The patient has no complaints today. The patient is sexually active.  The patient wears seatbelts: yes. The patient participates in regular exercise: yes. Has the patient ever been transfused or tattooed?: no. The patient reports that there is not domestic violence in her life.    Gynecologic History Menarche age: 25 Patient's last menstrual period was 04/19/2016. Contraception: condoms and OCP (estrogen/progesterone) History of STI's: Denies  Last Pap:  November 2016. Results were: normal.  Denies h/o abnormal pap smears.   Obstetric History   G0   P0   T0   P0   A0   L0    SAB0   TAB0   Ectopic0   Multiple0   Live Births0       Past Medical History:  Diagnosis Date  . Anxiety and depression   . Depression   . Migraine   . Recurrent UTI   . Ureteral reflux 2005   Right side    History reviewed. No pertinent surgical history.  Family History  Problem Relation Age of Onset  . Osteoporosis Mother   . Diabetes Father   . Hypertension Father     Social History   Social History  . Marital status: Single    Spouse name: N/A  . Number of children: N/A  . Years of education: N/A   Occupational History  . Not on file.   Social History Main Topics  . Smoking status: Never Smoker  . Smokeless tobacco: Not on file  . Alcohol use Yes  . Drug use: No  . Sexual activity: Yes    Birth control/ protection: Pill   Other Topics Concern  . Not on file   Social History Narrative  . No narrative on file    Current Outpatient Prescriptions on File Prior to Visit  Medication Sig Dispense Refill  . acetaminophen (TYLENOL) 500 MG tablet Take 1,000 mg by mouth every 6 (six) hours as needed for mild pain.     Marland Kitchen amoxicillin-clavulanate (AUGMENTIN) 875-125 MG tablet Take 1 tablet by mouth daily.    Marland Kitchen CALCIUM PO Take 1 tablet by mouth  daily.    . Cholecalciferol (VITAMIN D PO) Take 1 tablet by mouth daily.    Marland Kitchen FLUARIX QUADRIVALENT 0.5 ML injection TO BE ADMINISTERED BY PHARMACIST FOR IMMUNIZATION  0  . ibuprofen (ADVIL,MOTRIN) 200 MG tablet Take 800 mg by mouth every 6 (six) hours as needed for mild pain.    Marland Kitchen MAGNESIUM PO Take 1 tablet by mouth daily.    . Multiple Vitamins-Minerals (HAIR SKIN AND NAILS FORMULA) TABS Take 1 tablet by mouth daily.    . Multiple Vitamins-Minerals (ZINC PO) Take 1 tablet by mouth daily.    . nitrofurantoin, macrocrystal-monohydrate, (MACROBID) 100 MG capsule Take 1 capsule (100 mg total) by mouth 2 (two) times daily. 14 capsule 0  . norgestimate-ethinyl estradiol (PREVIFEM) 0.25-35 MG-MCG tablet Take 1 tablet by mouth daily. 1 Package 12  . ondansetron (ZOFRAN) 4 MG tablet Take 1 tablet (4 mg total) by mouth every 6 (six) hours. 12 tablet 0  . sertraline (ZOLOFT) 50 MG tablet Take 50 mg by mouth daily.  6   No current facility-administered medications on file prior to visit.     Allergies  Allergen Reactions  . Imitrex [Sumatriptan] Other (See Comments)    Had syncopal episode  .  Sumatriptan Succinate Other (See Comments)    Had syncopal episode     Review of Systems Constitutional: negative for chills, fatigue, fevers and sweats Eyes: negative for irritation, redness and visual disturbance Ears, nose, mouth, throat, and face: negative for hearing loss, nasal congestion, snoring and tinnitus Respiratory: negative for asthma, cough, sputum Cardiovascular: negative for chest pain, dyspnea, exertional chest pressure/discomfort, irregular heart beat, palpitations and syncope Gastrointestinal: negative for abdominal pain, change in bowel habits, nausea and vomiting Genitourinary: Positive for passage of clots with her period. Periods lasting 5-6 days, moderate flow. Negative for abnormal menstrual periods, genital lesions, sexual problems and vaginal discharge, dysuria and urinary  incontinence Integument/breast: negative for breast lump, breast tenderness and nipple discharge Hematologic/lymphatic: negative for bleeding and easy bruising Musculoskeletal:negative for back pain and muscle weakness Neurological: Complains of migraines with aura (mostly nausea.  Has h/o migraines, improved once started on contraception 5 years ago, however now notes that they are starting to worsen again). Negative for dizziness, vertigo and weakness Endocrine: negative for diabetic symptoms including polydipsia, polyuria and skin dryness Allergic/Immunologic: negative for hay fever and urticaria      Objective:  Blood pressure 117/71, pulse 90, height 5\' 1"  (1.549 m), weight 113 lb 8 oz (51.5 kg), last menstrual period 04/19/2016. Body mass index is 21.45 kg/m.   General Appearance:    Alert, cooperative, no distress, appears stated age  Head:    Normocephalic, without obvious abnormality, atraumatic  Eyes:    PERRL, conjunctiva/corneas clear, EOM's intact, both eyes  Ears:    Normal external ear canals, both ears  Nose:   Nares normal, septum midline, mucosa normal, no drainage or sinus tenderness  Throat:   Lips, mucosa, and tongue normal; teeth and gums normal  Neck:   Supple, symmetrical, trachea midline, no adenopathy; thyroid: no enlargement/tenderness/nodules; no carotid bruit or JVD  Back:     Symmetric, no curvature, ROM normal, no CVA tenderness  Lungs:     Clear to auscultation bilaterally, respirations unlabored  Chest Wall:    No tenderness or deformity   Heart:    Regular rate and rhythm, S1 and S2 normal, no murmur, rub or gallop  Breast Exam:    No tenderness, masses, or nipple abnormality  Abdomen:     Soft, non-tender, bowel sounds active all four quadrants, no masses, no organomegaly.    Genitalia:    Pelvic:external genitalia normal, vagina without lesions, discharge, or tenderness, rectovaginal septum  normal. Cervix normal in appearance, no cervical motion  tenderness, no adnexal masses or tenderness.  Uterus normal size, shape, mobile, regular contours, nontender.  Rectal:    Normal external sphincter.  No hemorrhoids appreciated. Internal exam not done.   Extremities:   Extremities normal, atraumatic, no cyanosis or edema  Pulses:   2+ and symmetric all extremities  Skin:   Skin color, texture, turgor normal, no rashes or lesions  Lymph nodes:   Cervical, supraclavicular, and axillary nodes normal  Neurologic:   CNII-XII intact, normal strength, sensation and reflexes throughout   .  Labs:  Lab Results  Component Value Date   WBC 15.3 (H) 12/06/2015   HGB 13.5 12/06/2015   HCT 41.2 12/06/2015   MCV 87.7 12/06/2015   PLT 182 12/06/2015    Lab Results  Component Value Date   CREATININE 0.75 12/06/2015   BUN 7 12/06/2015   NA 136 12/06/2015   K 4.0 12/06/2015   CL 104 12/06/2015   CO2 26 12/06/2015  Lab Results  Component Value Date   ALT 20 12/06/2015   AST 22 12/06/2015   ALKPHOS 96 12/06/2015   BILITOT 1.0 12/06/2015     Assessment:   Healthy female exam.  Migraines   Plan:    Labs performed earlier this year.  Breast self exam technique reviewed and patient encouraged to perform self-exam monthly. Contraception: OCP (estrogen/progesterone).  Discussed with patient to consider other options for contraception due to h/o migraines and with menorrhagia.  Reviewed contraception options with patient.  Patient leaning towards changing to [facility]. Given handout.  Will schedule for insertion in 3-4 weeks . Discussed healthy lifestyle modifications. Pap smear up to date.  Up to date on flu vaccine (received in September).  Has had Gardasil series.  Follow up in 1 year for annual exam.   Rubie Maid, MD [facility]

## 2016-04-24 ENCOUNTER — Other Ambulatory Visit: Payer: Self-pay | Admitting: Obstetrics and Gynecology

## 2016-04-24 DIAGNOSIS — Z3041 Encounter for surveillance of contraceptive pills: Secondary | ICD-10-CM

## 2016-04-25 ENCOUNTER — Ambulatory Visit (INDEPENDENT_AMBULATORY_CARE_PROVIDER_SITE_OTHER): Payer: 59 | Admitting: Obstetrics and Gynecology

## 2016-04-25 ENCOUNTER — Encounter: Payer: Self-pay | Admitting: Obstetrics and Gynecology

## 2016-04-25 VITALS — BP 117/71 | HR 90 | Ht 61.0 in | Wt 113.5 lb

## 2016-04-25 DIAGNOSIS — Z01419 Encounter for gynecological examination (general) (routine) without abnormal findings: Secondary | ICD-10-CM | POA: Diagnosis not present

## 2016-04-25 DIAGNOSIS — Z8742 Personal history of other diseases of the female genital tract: Secondary | ICD-10-CM | POA: Insufficient documentation

## 2016-04-25 DIAGNOSIS — G43109 Migraine with aura, not intractable, without status migrainosus: Secondary | ICD-10-CM | POA: Diagnosis not present

## 2016-04-25 DIAGNOSIS — Z3009 Encounter for other general counseling and advice on contraception: Secondary | ICD-10-CM

## 2016-04-25 NOTE — Patient Instructions (Signed)

## 2016-04-30 ENCOUNTER — Encounter: Payer: Self-pay | Admitting: Obstetrics and Gynecology

## 2016-05-23 ENCOUNTER — Ambulatory Visit (INDEPENDENT_AMBULATORY_CARE_PROVIDER_SITE_OTHER): Payer: 59 | Admitting: Obstetrics and Gynecology

## 2016-05-23 ENCOUNTER — Encounter: Payer: Self-pay | Admitting: Obstetrics and Gynecology

## 2016-05-23 VITALS — BP 119/89 | HR 110 | Ht 61.0 in | Wt 112.9 lb

## 2016-05-23 DIAGNOSIS — Z30430 Encounter for insertion of intrauterine contraceptive device: Secondary | ICD-10-CM | POA: Diagnosis not present

## 2016-05-23 LAB — POCT URINE PREGNANCY: Preg Test, Ur: NEGATIVE

## 2016-05-23 NOTE — Progress Notes (Signed)
     GYNECOLOGY OFFICE PROCEDURE NOTE  Eileen Davis is a 26 y.o. G0P0000 here for [facility] IUD insertion. No GYN concerns.  Last pap smear was on 02/2015 and was normal.  Patient's last menstrual period was 05/18/2016. recent cycle was lighter than normal.    IUD Insertion Procedure Note Patient identified, informed consent performed, consent signed.   Discussed risks of irregular bleeding, cramping, infection, malpositioning or misplacement of the IUD outside the uterus which may require further procedure such as laparoscopy. Time out was performed.  Urine pregnancy test was negative.  Speculum placed in the vagina.  Cervix visualized.  Cleaned with Betadine x 2.  Grasped anteriorly with a single tooth tenaculum.  Uterus sounded to 6 cm.  SkylaIUD placed per manufacturer's recommendations.  Strings trimmed to 3 cm. Tenaculum was removed, good hemostasis noted.  Patient tolerated procedure well.   Patient was given post-procedure instructions.  She was advised to have backup contraception for one week.  Patient was also asked to check IUD strings periodically and follow up in 4 weeks for IUD check.   Lot #: [id number] Exp: 09/2017   Rubie Maid, MD [facility]

## 2016-05-23 NOTE — Patient Instructions (Signed)
Intrauterine Device Insertion, Care After Refer to this sheet in the next few weeks. These instructions provide you with information on caring for yourself after your procedure. Your health care provider may also give you more specific instructions. Your treatment has been planned according to current medical practices, but problems sometimes occur. Call your health care provider if you have any problems or questions after your procedure. WHAT TO EXPECT AFTER THE PROCEDURE Insertion of the IUD may cause some discomfort, such as cramping. The cramping should improve after the IUD is in place. You may have bleeding after the procedure. This is normal. It varies from light spotting for a few days to menstrual-like bleeding. When the IUD is in place, a string will extend past the cervix into the vagina for 1-2 inches. The strings should not bother you or your partner. If they do, talk to your health care provider.  HOME CARE INSTRUCTIONS  Check your intrauterine device (IUD) to make sure it is in place before you resume sexual activity. You should be able to feel the strings. If you cannot feel the strings, something may be wrong. The IUD may have fallen out of the uterus, or the uterus may have been punctured (perforated) during placement. Also, if the strings are getting longer, it may mean that the IUD is being forced out of the uterus. You no longer have full protection from pregnancy if any of these problems occur. You may resume sexual intercourse if you are not having problems with the IUD. The copper IUD is considered immediately effective, and the hormone IUD works right away if inserted within 7 days of your period starting. You will need to use a backup method of birth control for 7 days if the IUD in inserted at any other time in your cycle. Continue to check that the IUD is still in place by feeling for the strings after every menstrual period. You may need to take pain medicine such as  acetaminophen or ibuprofen. Only take medicines as directed by your health care provider. SEEK MEDICAL CARE IF:  You have bleeding that is heavier or lasts longer than a normal menstrual cycle. You have a fever. You have increasing cramps or abdominal pain not relieved with medicine. You have abdominal pain that does not seem to be related to the same area of earlier cramping and pain. You are lightheaded, unusually weak, or faint. You have abnormal vaginal discharge or smells. You have pain during sexual intercourse. You cannot feel the IUD strings, or the IUD string has gotten longer. You feel the IUD at the opening of the cervix in the vagina. You think you are pregnant, or you miss your menstrual period. The IUD string is hurting your sex partner. MAKE SURE YOU: Understand these instructions. Will watch your condition. Will get help right away if you are not doing well or get worse. This information is not intended to replace advice given to you by your health care provider. Make sure you discuss any questions you have with your health care provider. Document Released: 12/06/2010 Document Revised: 01/28/2013 Document Reviewed: 09/28/2012 Elsevier Interactive Patient Education  2017 [employer]. 1.

## 2016-06-07 DIAGNOSIS — N390 Urinary tract infection, site not specified: Secondary | ICD-10-CM | POA: Diagnosis not present

## 2016-06-07 DIAGNOSIS — F329 Major depressive disorder, single episode, unspecified: Secondary | ICD-10-CM | POA: Diagnosis not present

## 2016-06-07 DIAGNOSIS — R300 Dysuria: Secondary | ICD-10-CM | POA: Diagnosis not present

## 2016-06-18 DIAGNOSIS — R300 Dysuria: Secondary | ICD-10-CM | POA: Diagnosis not present

## 2016-06-20 ENCOUNTER — Ambulatory Visit (INDEPENDENT_AMBULATORY_CARE_PROVIDER_SITE_OTHER): Payer: 59 | Admitting: Obstetrics and Gynecology

## 2016-06-20 VITALS — BP 120/78 | HR 91 | Ht 61.0 in | Wt 115.6 lb

## 2016-06-20 DIAGNOSIS — Z30431 Encounter for routine checking of intrauterine contraceptive device: Secondary | ICD-10-CM

## 2016-06-20 NOTE — Progress Notes (Addendum)
    GYNECOLOGY OFFICE PROGRESS NOTE  History:  26 y.o. G0P0 here today for today for IUD string check; Skyla IUD was placed  05/23/2016. No complaints about the IUD, no concerning side effects.  The following portions of the patient's history were reviewed and updated as appropriate: allergies, current medications, past family history, past medical history, past social history, past surgical history and problem list. Last pap smear on 02/2015 was normal, negative HRHPV.  Review of Systems:   Pertinent items are noted in HPI.  Objective:  Physical Exam Blood pressure 120/78, pulse 91, height 5\' 1"  (1.549 m), weight 115 lb 9.6 oz (52.4 kg), last menstrual period 06/13/2016. CONSTITUTIONAL: Well-developed, well-nourished female in no acute distress.  ABDOMEN: Soft, no distention noted.   PELVIC: Normal appearing external genitalia; normal appearing vaginal mucosa and cervix.  IUD strings visualized, about 3 cm in length outside cervix.  EXTREMITIES: extremities normal, atraumatic, no cyanosis or edema NEUROLOGIC:  Grossly normal.    Assessment & Plan:  Normal IUD check. Patient to keep IUD in place for three years; can come in for removal if she desires pregnancy within the next three years. Routine preventative health maintenance measures emphasized.    Rubie Maid, MD [facility]

## 2016-06-21 HISTORY — PX: WISDOM TOOTH EXTRACTION: SHX21

## 2016-07-04 ENCOUNTER — Telehealth: Payer: Self-pay | Admitting: *Deleted

## 2016-07-04 NOTE — Telephone Encounter (Signed)
Called pt no answer. LM for pt informing her that IUD strings were recently seen (2wks ago) and placement at that time was fine, IUD migration unlikely. Advised pt on self string check, as well as the use of tylenol and motrin for pain. Advised pt that if pain is not controlled with these measures to call back.

## 2016-07-04 NOTE — Telephone Encounter (Signed)
Patient called and states she is having left sided abdomen pain . Patient had a IUD inserted 2 months ago. Patient states you can call her on her cell. She is in class today and stated you can leave her a detailed message if she doesn't answer. Her number is [phone].  Please advise. Thank you

## 2016-09-14 DIAGNOSIS — J029 Acute pharyngitis, unspecified: Secondary | ICD-10-CM | POA: Diagnosis not present

## 2016-10-04 DIAGNOSIS — Z113 Encounter for screening for infections with a predominantly sexual mode of transmission: Secondary | ICD-10-CM | POA: Diagnosis not present

## 2016-10-04 DIAGNOSIS — N390 Urinary tract infection, site not specified: Secondary | ICD-10-CM | POA: Diagnosis not present

## 2016-10-17 ENCOUNTER — Ambulatory Visit: Payer: 59 | Admitting: Physician Assistant

## 2016-10-19 ENCOUNTER — Ambulatory Visit (INDEPENDENT_AMBULATORY_CARE_PROVIDER_SITE_OTHER): Payer: 59 | Admitting: Physician Assistant

## 2016-10-19 ENCOUNTER — Encounter: Payer: Self-pay | Admitting: Physician Assistant

## 2016-10-19 VITALS — BP 106/68 | HR 84 | Temp 98.3°F | Resp 16 | Wt 122.0 lb

## 2016-10-19 DIAGNOSIS — Z87448 Personal history of other diseases of urinary system: Secondary | ICD-10-CM | POA: Diagnosis not present

## 2016-10-19 DIAGNOSIS — F419 Anxiety disorder, unspecified: Secondary | ICD-10-CM | POA: Diagnosis not present

## 2016-10-19 DIAGNOSIS — N390 Urinary tract infection, site not specified: Secondary | ICD-10-CM | POA: Diagnosis not present

## 2016-10-19 DIAGNOSIS — F32A Depression, unspecified: Secondary | ICD-10-CM

## 2016-10-19 DIAGNOSIS — F329 Major depressive disorder, single episode, unspecified: Secondary | ICD-10-CM | POA: Diagnosis not present

## 2016-10-19 DIAGNOSIS — Z0000 Encounter for general adult medical examination without abnormal findings: Secondary | ICD-10-CM | POA: Diagnosis not present

## 2016-10-19 MED ORDER — SERTRALINE HCL 50 MG PO TABS: 50.0000 mg | ORAL_TABLET | Freq: Every day | ORAL | 1 refills | Status: DC

## 2016-10-19 NOTE — Patient Instructions (Signed)

## 2016-10-19 NOTE — Progress Notes (Signed)
Patient: Eileen Davis Female    DOB: Dec 30, 1990   26 y.o.   MRN: [id number] Visit Date: 10/19/2016  Today's Provider: Trinna Post, PA-C   Chief Complaint  Patient presents with  . Establish Care  . Depression  . Anxiety   Subjective:      Eileen Davis is a 26 y/o woman presenting today for annual physical and to establish care. Was previously seen at [facility].  She lives in [place] with her parents. She is currently working as a Audiological scientist for [employer] She has one dog, Yogi. She has a boyfriend of one year who is also a paramedic. She is sexually active with single female partner, her boyfriend. Uses condoms and has IUD placed at [facility]. Last PAP 2016 negative from [place].  She has history of anxiety and depression. She is stable on 50 mg zoloft. She saw therapist Gareth Eagle at [place] which she found very helpful and plans to go back when she has time.  She has a history of vesicoureteral reflux and recurrent UTI, as well as some episodes of pylenoephritis. She attempted prophylactic therapy with macrobid, but was intolerant of drug. She was given Augmentin for self-spot treatment when seeing her [place] urologist, but would like to transfer her treatment to the [place] area.  Possible family history of breast and ovarian cancer in distant relatives. No history of colon cancer.  She drinks 1-2 drinks per week. Never smoked or used tobacco products. No drugs.   Depression         This is a chronic problem.  Progression since onset: Stable on current dose of Zoloft.  Associated symptoms include decreased concentration and headaches.  Associated symptoms include no fatigue, no helplessness, no hopelessness, does not have insomnia, not irritable, no restlessness, no decreased interest, no appetite change and no myalgias.  Compliance with treatment is good.  Previous treatment provided moderate (Pt reports having "Good and Bad days") relief.  Past  medical history includes anxiety.     Pertinent negatives include no suicide attempts. Anxiety  Symptoms include decreased concentration and nervous/anxious behavior. Patient reports no dizziness, insomnia or restlessness.   There is no history of suicide attempts.       Allergies  Allergen Reactions  . Imitrex [Sumatriptan] Other (See Comments)    Had syncopal episode  . Sumatriptan Succinate Other (See Comments)    Had syncopal episode     Current Outpatient Prescriptions:  .  ibuprofen (ADVIL,MOTRIN) 200 MG tablet, Take 800 mg by mouth every 6 (six) hours as needed for mild pain., Disp: , Rfl:  .  Multiple Vitamins-Minerals (HAIR SKIN AND NAILS FORMULA) TABS, Take 1 tablet by mouth daily., Disp: , Rfl:  .  sertraline (ZOLOFT) 50 MG tablet, Take 50 mg by mouth daily., Disp: , Rfl: 6  Review of Systems  Constitutional: Negative.  Negative for appetite change and fatigue.  HENT: Negative.   Eyes: Negative.   Respiratory: Negative.   Cardiovascular: Negative.   Gastrointestinal: Negative.   Endocrine: Negative.   Genitourinary: Negative.   Musculoskeletal: Positive for arthralgias and back pain. Negative for gait problem, joint swelling, myalgias, neck pain and neck stiffness.  Skin: Negative.   Allergic/Immunologic: Negative.   Neurological: Positive for headaches. Negative for dizziness, tremors, seizures, syncope, facial asymmetry, speech difficulty, weakness, light-headedness and numbness.  Hematological: Negative.   Psychiatric/Behavioral: Positive for decreased concentration, depression and sleep disturbance. The patient is nervous/anxious. The patient does not have  insomnia.     Social History  Substance Use Topics  . Smoking status: Never Smoker  . Smokeless tobacco: Never Used  . Alcohol use Yes     Comment: Rarely   Objective:   BP 106/68 (BP Location: Left Arm, Patient Position: Sitting, Cuff Size: Normal)   Pulse 84   Temp 98.3 F (36.8 C) (Oral)   Resp  16   Wt 122 lb (55.3 kg)   LMP 10/10/2016   BMI 23.05 kg/m  Vitals:   10/19/16 0925  BP: 106/68  Pulse: 84  Resp: 16  Temp: 98.3 F (36.8 C)  TempSrc: Oral  Weight: 122 lb (55.3 kg)     Physical Exam  Constitutional: She is oriented to person, place, and time. She appears well-developed and well-nourished. She is not irritable.  HENT:  Right Ear: External ear normal.  Left Ear: External ear normal.  Mouth/Throat: Oropharynx is clear and moist. No oropharyngeal exudate.  Eyes: Conjunctivae are normal.  Neck: Neck supple.  Cardiovascular: Normal rate and regular rhythm.   Pulmonary/Chest: Effort normal and breath sounds normal.  Abdominal: Soft. Bowel sounds are normal.  Lymphadenopathy:    She has no cervical adenopathy.  Neurological: She is oriented to person, place, and time.  Skin: Skin is warm and dry.  Psychiatric: She has a normal mood and affect. Her behavior is normal.        Assessment & Plan:     1. Annual physical exam  Baseline labwork as below. Please be fasting.  - Comprehensive metabolic panel - CBC with Differential/Platelet - TSH - Lipid panel  2. Anxiety and depression  Stable, refilled. If she needs to go up, please call us, we can increase dose and then see her for follow up.  - sertraline (ZOLOFT) 50 MG tablet; Take 1 tablet (50 mg total) by mouth daily.  Dispense: 90 tablet; Refill: 1  3. Recurrent UTI  Welcome to come here for symptoms, also offered urology referral which she accepts.  - Ambulatory referral to Urology  4. History of vesicoureteral reflux  - Ambulatory referral to Urology  Return in about 1 year (around 10/19/2017) for CPE.  The entirety of the information documented in the History of Present Illness, Review of Systems and Physical Exam were personally obtained by me. Portions of this information were initially documented by Ashley Royalty, CMA and reviewed by me for thoroughness and accuracy.        Trinna Post, PA-C  [facility]

## 2016-10-25 DIAGNOSIS — Z0000 Encounter for general adult medical examination without abnormal findings: Secondary | ICD-10-CM | POA: Diagnosis not present

## 2016-10-26 LAB — CBC WITH DIFFERENTIAL/PLATELET
Basophils Absolute: 0 10*3/uL (ref 0.0–0.2)
Basos: 0 %
EOS (ABSOLUTE): 0.1 10*3/uL (ref 0.0–0.4)
Eos: 2 %
Hematocrit: 40.3 % (ref 34.0–46.6)
Hemoglobin: 14.1 g/dL (ref 11.1–15.9)
Immature Grans (Abs): 0 10*3/uL (ref 0.0–0.1)
Immature Granulocytes: 0 %
Lymphocytes Absolute: 1.9 10*3/uL (ref 0.7–3.1)
Lymphs: 37 %
MCH: 29.9 pg (ref 26.6–33.0)
MCHC: 35 g/dL (ref 31.5–35.7)
MCV: 85 fL (ref 79–97)
Monocytes Absolute: 0.6 10*3/uL (ref 0.1–0.9)
Monocytes: 11 %
Neutrophils Absolute: 2.6 10*3/uL (ref 1.4–7.0)
Neutrophils: 50 %
Platelets: 200 10*3/uL (ref 150–379)
RBC: 4.72 x10E6/uL (ref 3.77–5.28)
RDW: 14 % (ref 12.3–15.4)
WBC: 5.3 10*3/uL (ref 3.4–10.8)

## 2016-10-26 LAB — COMPREHENSIVE METABOLIC PANEL
ALT: 10 IU/L (ref 0–32)
AST: 22 IU/L (ref 0–40)
Albumin/Globulin Ratio: 1.9 (ref 1.2–2.2)
Albumin: 4.5 g/dL (ref 3.5–5.5)
Alkaline Phosphatase: 117 IU/L (ref 39–117)
BUN/Creatinine Ratio: 14 (ref 9–23)
BUN: 9 mg/dL (ref 6–20)
Bilirubin Total: 0.6 mg/dL (ref 0.0–1.2)
CO2: 22 mmol/L (ref 20–29)
Calcium: 9.3 mg/dL (ref 8.7–10.2)
Chloride: 104 mmol/L (ref 96–106)
Creatinine, Ser: 0.66 mg/dL (ref 0.57–1.00)
GFR calc Af Amer: 143 mL/min/{1.73_m2} (ref >59)
GFR calc non Af Amer: 124 mL/min/{1.73_m2} (ref >59)
Globulin, Total: 2.4 g/dL (ref 1.5–4.5)
Glucose: 90 mg/dL (ref 65–99)
Potassium: 5 mmol/L (ref 3.5–5.2)
Sodium: 140 mmol/L (ref 134–144)
Total Protein: 6.9 g/dL (ref 6.0–8.5)

## 2016-10-26 LAB — LIPID PANEL
Chol/HDL Ratio: 2.4 ratio (ref 0.0–4.4)
Cholesterol, Total: 185 mg/dL (ref 100–199)
HDL: 78 mg/dL (ref >39)
LDL Calculated: 97 mg/dL (ref 0–99)
Triglycerides: 52 mg/dL (ref 0–149)
VLDL Cholesterol Cal: 10 mg/dL (ref 5–40)

## 2016-10-26 LAB — TSH: TSH: 1.97 u[IU]/mL (ref 0.450–4.500)

## 2016-11-19 ENCOUNTER — Ambulatory Visit: Payer: 59

## 2016-11-26 ENCOUNTER — Ambulatory Visit (INDEPENDENT_AMBULATORY_CARE_PROVIDER_SITE_OTHER): Payer: 59 | Admitting: Urology

## 2016-11-26 ENCOUNTER — Encounter: Payer: Self-pay | Admitting: Urology

## 2016-11-26 VITALS — BP 95/61 | HR 70 | Ht 61.0 in | Wt 118.9 lb

## 2016-11-26 DIAGNOSIS — N390 Urinary tract infection, site not specified: Secondary | ICD-10-CM | POA: Diagnosis not present

## 2016-11-26 LAB — URINALYSIS, COMPLETE
Bilirubin, UA: NEGATIVE
GLUCOSE, UA: NEGATIVE
Ketones, UA: NEGATIVE
Leukocytes, UA: NEGATIVE
NITRITE UA: NEGATIVE
PH UA: 6 (ref 5.0–7.5)
PROTEIN UA: NEGATIVE
Specific Gravity, UA: 1.025 (ref 1.005–1.030)
UUROB: 0.2 mg/dL (ref 0.2–1.0)

## 2016-11-26 LAB — MICROSCOPIC EXAMINATION
BACTERIA UA: NONE SEEN
RBC MICROSCOPIC, UA: NONE SEEN /HPF (ref 0–?)
WBC UA: NONE SEEN /HPF (ref 0–?)

## 2016-11-26 NOTE — Progress Notes (Signed)
11/26/2016 11:43 AM   Eileen Davis 1991-03-24 [id number]  Referring provider: Trinna Post, PA-C [address] [place], [place] [address]  Chief Complaint  Patient presents with  . Recurrent UTI    HPI: Patient was assessed by [facility] urology February 2017. She has had a reflux surgery and has good differential renal function on scans. She had 3 positive cultures noted in 2016. For 1 month she was infection free on daily macro and 10 and a residual was 26 mL. Noted renal ultrasound was normal. She had a normal CT scan August 2017  Today Clinically noninfected today. She gets one or 2 infections a year and is not on daily prophylaxis. She wanted to be established with a local urologist. She works as a Publishing rights manager.  At baseline she voids every 1 or 2 hours and sometimes gets up once at night D pending on fluid intake.  When she gets infected she can have some right flank discomfort. She has not had reflux surgery. She is noted to reflux on the right. According to the medical record as of 2016 she does not reflux. She can have a low-grade temperature.  Modifying factors: There are no other modifying factors  Associated signs and symptoms: There are no other associated signs and symptoms Aggravating and relieving factors: There are no other aggravating or relieving factors Severity: Mile Duration: Not persistent   PMH: Past Medical History:  Diagnosis Date  . Anxiety and depression   . Depression   . Migraine   . Recurrent UTI   . Ureteral reflux 2005   Right side    Surgical History: Past Surgical History:  Procedure Laterality Date  . WISDOM TOOTH EXTRACTION  06/2016    Home Medications:  Allergies as of 11/26/2016      Reactions   Imitrex [sumatriptan] Other (See Comments)   Had syncopal episode   Sumatriptan Succinate Other (See Comments)   Had syncopal episode      Medication List       Accurate as of 11/26/16 11:43 AM. Always use your  most recent med list.          HAIR SKIN AND NAILS FORMULA Tabs Take 1 tablet by mouth daily.   ibuprofen 200 MG tablet Commonly known as:  ADVIL,MOTRIN Take 800 mg by mouth every 6 (six) hours as needed for mild pain.   sertraline 50 MG tablet Commonly known as:  ZOLOFT Take 1 tablet (50 mg total) by mouth daily.       Allergies:  Allergies  Allergen Reactions  . Imitrex [Sumatriptan] Other (See Comments)    Had syncopal episode  . Sumatriptan Succinate Other (See Comments)    Had syncopal episode    Family History: Family History  Problem Relation Age of Onset  . Osteoporosis Mother   . Diabetes Father   . Hypertension Father   . Healthy Brother   . ADD / ADHD Brother   . Ovarian cancer Maternal Grandmother   . Prostate cancer Maternal Grandfather   . Congestive Heart Failure Paternal Grandmother   . CVA Paternal Grandmother   . Lung cancer Paternal Grandfather     Social History:  reports that she has never smoked. She has never used smokeless tobacco. She reports that she drinks alcohol. She reports that she does not use drugs.  ROS: UROLOGY Frequent Urination?: No Hard to postpone urination?: No Burning/pain with urination?: No Get up at night to urinate?: Yes Leakage of urine?:  No Urine stream starts and stops?: No Trouble starting stream?: No Do you have to strain to urinate?: No Blood in urine?: No Urinary tract infection?: Yes Sexually transmitted disease?: No Injury to kidneys or bladder?: No Painful intercourse?: No Weak stream?: No Currently pregnant?: No Vaginal bleeding?: No Last menstrual period?: n  Gastrointestinal Nausea?: No Vomiting?: No Indigestion/heartburn?: No Diarrhea?: No Constipation?: No  Constitutional Fever: No Night sweats?: No Weight loss?: No Fatigue?: No  Skin Skin rash/lesions?: No Itching?: No  Eyes Blurred vision?: No Double vision?: No  Ears/Nose/Throat Sore throat?: No Sinus problems?:  No  Hematologic/Lymphatic Swollen glands?: No Easy bruising?: No  Cardiovascular Leg swelling?: No Chest pain?: No  Respiratory Cough?: No Shortness of breath?: No  Endocrine Excessive thirst?: No  Musculoskeletal Back pain?: No Joint pain?: No  Neurological Headaches?: No Dizziness?: No  Psychologic Depression?: Yes Anxiety?: Yes  Physical Exam: BP 95/61 (BP Location: Left Arm, Patient Position: Sitting, Cuff Size: Normal)   Pulse 70   Ht 5\' 1"  (1.549 m)   Wt 53.9 kg (118 lb 14.4 oz)   LMP 11/24/2016   BMI 22.47 kg/m   Constitutional:  Alert and oriented, No acute distress. HEENT: [place] AT, moist mucus membranes.  Trachea midline, no masses. Cardiovascular: No clubbing, cyanosis, or edema. Respiratory: Normal respiratory effort, no increased work of breathing. GI: Abdomen is soft, nontender, nondistended, no abdominal masses GU: No CVA tenderness. No bladder tenderness Skin: No rashes, bruises or suspicious lesions. Lymph: No cervical or inguinal adenopathy. Neurologic: Grossly intact, no focal deficits, moving all 4 extremities. Psychiatric: Normal mood and affect.  Laboratory Data: Lab Results  Component Value Date   WBC 5.3 10/25/2016   HGB 14.1 10/25/2016   HCT 40.3 10/25/2016   MCV 85 10/25/2016   PLT 200 10/25/2016    Lab Results  Component Value Date   CREATININE 0.66 10/25/2016    No results found for: PSA  No results found for: TESTOSTERONE  No results found for: HGBA1C  Urinalysis    Component Value Date/Time   COLORURINE ORANGE (A) 12/06/2015 0457   APPEARANCEUR TURBID (A) 12/06/2015 0457   LABSPEC 1.025 12/06/2015 0457   PHURINE 6.0 12/06/2015 0457   GLUCOSEU NEGATIVE 12/06/2015 0457   HGBUR MODERATE (A) 12/06/2015 0457   BILIRUBINUR NEGATIVE 12/06/2015 0457   KETONESUR 15 (A) 12/06/2015 0457   PROTEINUR NEGATIVE 12/06/2015 0457   NITRITE POSITIVE (A) 12/06/2015 0457   LEUKOCYTESUR SMALL (A) 12/06/2015 0457    Pertinent  Imaging: None  Assessment & Plan:  The patient has recurrent bladder infections that will be treated when necessary. She has had low-grade fever and right-sided flank pain in the past. At this stage we will not start prophylactic antibiotics. To clarify the patient reports that she still does reflux. He agreed to treat infections when necessary  1. Recurrent UTI 2. Daytime frequency   No Follow-up on file.  Reece Packer, MD  [facility] [address] [place], [place] [address] [phone]

## 2017-01-03 ENCOUNTER — Encounter: Payer: Self-pay | Admitting: Physician Assistant

## 2017-01-03 ENCOUNTER — Ambulatory Visit (INDEPENDENT_AMBULATORY_CARE_PROVIDER_SITE_OTHER): Payer: 59 | Admitting: Physician Assistant

## 2017-01-03 VITALS — BP 104/62 | HR 104 | Temp 98.0°F | Resp 16 | Wt 122.0 lb

## 2017-01-03 DIAGNOSIS — J40 Bronchitis, not specified as acute or chronic: Secondary | ICD-10-CM

## 2017-01-03 MED ORDER — BENZONATATE 100 MG PO CAPS: 100.0000 mg | ORAL_CAPSULE | Freq: Three times a day (TID) | ORAL | 0 refills | Status: AC | PRN

## 2017-01-03 MED ORDER — AZITHROMYCIN 250 MG PO TABS: ORAL_TABLET | ORAL | 0 refills | Status: DC

## 2017-01-03 NOTE — Progress Notes (Addendum)
[place]  Chief Complaint  Patient presents with  . URI    Subjective:    Patient ID: Eileen Davis, female    DOB: [birth year], 26 y.o.   MRN: [id number]  Upper Respiratory Infection: Eileen Davis is a  26 y.o. female Symptoms include congestion, cough and sore throat. Onset of symptoms was 2 weeks ago, gradually worsening since that time. She also c/o congestion, cough described as productive, nasal congestion and post nasal drip for the past 2 weeks .  She is drinking plenty of fluids. Evaluation to date: none. Treatment to date: cough suppressants. The treatment has provided no relief.   Review of Systems  Constitutional: Positive for fatigue. Negative for activity change, appetite change, chills, diaphoresis, fever and unexpected weight change.  HENT: Positive for congestion, postnasal drip, rhinorrhea, sneezing, sore throat and voice change. Negative for ear discharge, ear pain, nosebleeds, sinus pain, sinus pressure, tinnitus and trouble swallowing.   Eyes: Negative.   Respiratory: Positive for cough. Negative for apnea, choking, chest tightness, shortness of breath, wheezing and stridor.   Gastrointestinal: Negative.   Neurological: Negative for dizziness, light-headedness and headaches.       Objective:   BP 104/62 (BP Location: Right Arm, Patient Position: Sitting, Cuff Size: Normal)   Pulse (!) 104   Temp 98 F (36.7 C) (Oral)   Resp 16   Wt 122 lb (55.3 kg)   LMP 12/20/2016   SpO2 95%   BMI 23.05 kg/m   Patient Active Problem List   Diagnosis Date Noted  . History of ovarian cyst 04/25/2016  . Infection of urinary tract 03/01/2015  . Reflux, vesicoureteral 03/01/2015    Outpatient Encounter Prescriptions as of 01/03/2017  Medication Sig  . ibuprofen (ADVIL,MOTRIN) 200 MG tablet Take 800 mg by mouth every 6 (six) hours as needed for mild pain.  . Multiple Vitamins-Minerals (HAIR SKIN AND NAILS FORMULA) TABS Take  1 tablet by mouth daily.  . sertraline (ZOLOFT) 50 MG tablet Take 1 tablet (50 mg total) by mouth daily.   No facility-administered encounter medications on file as of 01/03/2017.     Allergies  Allergen Reactions  . Imitrex [Sumatriptan] Other (See Comments)    Had syncopal episode  . Sumatriptan Succinate Other (See Comments)    Had syncopal episode       Physical Exam  Constitutional: She is oriented to person, place, and time. She appears well-developed and well-nourished.  Hoarse voice.   HENT:  Mouth/Throat: Oropharynx is clear and moist. No oropharyngeal exudate.  Neck: Neck supple.  Cardiovascular: Normal rate and regular rhythm.   Pulmonary/Chest: Effort normal. She has wheezes.  Slight wheezing.  Lymphadenopathy:    She has cervical adenopathy.  Neurological: She is alert and oriented to person, place, and time.  Skin: Skin is warm and dry.  Psychiatric: She has a normal mood and affect. Her behavior is normal.       Assessment & Plan:  1. Bronchitis  Will cover for secondary bacterial infection/sinusitis with z-pack. Encouraged fluids, cough medicine.   - azithromycin (ZITHROMAX) 250 MG tablet; Take 2 pills on day 1, then one pill the following four days.  Dispense: 6 tablet; Refill: 0 - benzonatate (TESSALON PERLES) 100 MG capsule; Take 1 capsule (100 mg total) by mouth 3 (three) times daily as needed for cough.  Dispense: 21 capsule; Refill: 0  Return if symptoms worsen or fail to improve.   The entirety of the information documented  in the History of Present Illness, Review of Systems and Physical Exam were personally obtained by me. Portions of this information were initially documented by Ashley Royalty, CMA and reviewed by me for thoroughness and accuracy.

## 2017-01-03 NOTE — Patient Instructions (Signed)

## 2017-04-08 ENCOUNTER — Other Ambulatory Visit: Payer: Self-pay | Admitting: Physician Assistant

## 2017-04-08 DIAGNOSIS — F329 Major depressive disorder, single episode, unspecified: Secondary | ICD-10-CM

## 2017-04-08 DIAGNOSIS — F419 Anxiety disorder, unspecified: Principal | ICD-10-CM

## 2017-04-25 ENCOUNTER — Telehealth: Payer: Self-pay

## 2017-04-25 NOTE — Telephone Encounter (Signed)
Patient called asking for refills on following medications (patient states has had these medications available on hand for years) Zofran 4 mg-from Dr. Randel Books, Bentyl 20 mg-from Dr. Jiles Garter provider. She takes these when she has really bad anxiety and depression, stomach gets really upset. Patient understands if she needs to come in and be seen for this first.-Janitza Revuelta Estell Harpin, RMA

## 2017-04-26 NOTE — Telephone Encounter (Signed)
I would recommend she come back for a visit when she can. We did talk about her anxiety but not these treatments for it. May discuss other options as well.

## 2017-04-26 NOTE — Telephone Encounter (Signed)
Pt informed. She is at work right now so she will call back to make an appointment to come back in for this.

## 2017-05-03 ENCOUNTER — Ambulatory Visit: Payer: 59 | Admitting: Physician Assistant

## 2017-05-08 ENCOUNTER — Ambulatory Visit: Payer: 59 | Admitting: Physician Assistant

## 2017-05-08 ENCOUNTER — Encounter: Payer: Self-pay | Admitting: Physician Assistant

## 2017-05-08 VITALS — BP 106/68 | HR 84 | Temp 98.4°F | Resp 16 | Wt 123.0 lb

## 2017-05-08 DIAGNOSIS — R197 Diarrhea, unspecified: Secondary | ICD-10-CM

## 2017-05-08 DIAGNOSIS — F419 Anxiety disorder, unspecified: Secondary | ICD-10-CM | POA: Diagnosis not present

## 2017-05-08 DIAGNOSIS — R110 Nausea: Secondary | ICD-10-CM

## 2017-05-08 DIAGNOSIS — R109 Unspecified abdominal pain: Secondary | ICD-10-CM

## 2017-05-08 MED ORDER — ONDANSETRON HCL 4 MG PO TABS: 4.0000 mg | ORAL_TABLET | Freq: Three times a day (TID) | ORAL | 2 refills | Status: DC | PRN

## 2017-05-08 MED ORDER — DICYCLOMINE HCL 20 MG PO TABS: 20.0000 mg | ORAL_TABLET | Freq: Four times a day (QID) | ORAL | 2 refills | Status: DC | PRN

## 2017-05-08 NOTE — Patient Instructions (Signed)
Diarrhea, Adult °Diarrhea is when you have loose and water poop (stool) often. Diarrhea can make you feel weak and cause you to get dehydrated. Dehydration can make you tired and thirsty, make you have a dry mouth, and make it so you pee (urinate) less often. Diarrhea often lasts 2-3 days. However, it can last longer if it is a sign of something more serious. It is important to treat your diarrhea as told by your doctor. °Follow these instructions at home: °Eating and drinking ° °Follow these recommendations as told by your doctor: °· Take an oral rehydration solution (ORS). This is a drink that is sold at pharmacies and stores. °· Drink clear fluids, such as: °? Water. °? Ice chips. °? Diluted fruit juice. °? Low-calorie sports drinks. °· Eat bland, easy-to-digest foods in small amounts as you are able. These foods include: °? Bananas. °? Applesauce. °? Rice. °? Low-fat (lean) meats. °? Toast. °? Crackers. °· Avoid drinking fluids that have a lot of sugar or caffeine in them. °· Avoid alcohol. °· Avoid spicy or fatty foods. ° °General instructions ° °· Drink enough fluid to keep your pee (urine) clear or pale yellow. °· Wash your hands often. If you cannot use soap and water, use hand sanitizer. °· Make sure that all people in your home wash their hands well and often. °· Take over-the-counter and prescription medicines only as told by your doctor. °· Rest at home while you get better. °· Watch your condition for any changes. °· Take a warm bath to help with any burning or pain from having diarrhea. °· Keep all follow-up visits as told by your doctor. This is important. °Contact a doctor if: °· You have a fever. °· Your diarrhea gets worse. °· You have new symptoms. °· You cannot keep fluids down. °· You feel light-headed or dizzy. °· You have a headache. °· You have muscle cramps. °Get help right away if: °· You have chest pain. °· You feel very weak or you pass out (faint). °· You have bloody or black poop or  poop that look like tar. °· You have very bad pain, cramping, or bloating in your belly (abdomen). °· You have trouble breathing or you are breathing very quickly. °· Your heart is beating very quickly. °· Your skin feels cold and clammy. °· You feel confused. °· You have signs of dehydration, such as: °? Dark pee, hardly any pee, or no pee. °? Cracked lips. °? Dry mouth. °? Sunken eyes. °? Sleepiness. °? Weakness. °This information is not intended to replace advice given to you by your health care provider. Make sure you discuss any questions you have with your health care provider. °Document Released: 09/26/2007 Document Revised: 10/28/2015 Document Reviewed: 12/14/2014 °Elsevier Interactive Patient Education © 2018 [employer] ° °

## 2017-05-08 NOTE — Progress Notes (Signed)
Patient: Eileen Davis Female    DOB: [birth year]   27 y.o.   MRN: [id number] Visit Date: 05/08/2017  Today's Provider: Trinna Post, PA-C   Chief Complaint  Patient presents with  . Irritable Bowel Syndrome    Follow up, needs refills on medications.   Subjective:    Eileen Davis is a 27 y/o woman presenting today for abdominal cramping, diarrhea, and nausea.  She has had a history of abdominal cramping since high school. She reports she has always used Bentyl for this with good resolution. For the past few months, she has had abdominal cramping accompanied by sudden urgency and bouts of diarrhea. She denies blood in her stool. No history of colon cancer or IBD in her family. She does report some increased anxiety. Has never been tested for Celiac.  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  Additionally, reports some episodes of anxiety that break through. She is able to breathe through them. Not with therapist right now, she is on 50 mg of zoloft daily.   Abdominal Cramping  This is a chronic problem. The current episode started more than 1 year ago. The problem occurs intermittently (about 1-2 times a week.). The problem has been unchanged. The pain is located in the generalized abdominal region. The quality of the pain is cramping. The abdominal pain does not radiate. Associated symptoms include diarrhea and nausea. Pertinent negatives include no constipation or vomiting.       Allergies  Allergen Reactions  . Imitrex [Sumatriptan] Other (See Comments)    Had syncopal episode  . Sumatriptan Succinate Other (See Comments)    Had syncopal episode     Current Outpatient Medications:  .  ibuprofen (ADVIL,MOTRIN) 200 MG tablet, Take 800 mg by mouth every 6 (six) hours as needed for mild pain., Disp: , Rfl:  .  levonorgestrel (MIRENA) 20 MCG/24HR IUD, 1 each by Intrauterine route once., Disp: ,  Rfl:  .  Multiple Vitamins-Minerals (HAIR SKIN AND NAILS FORMULA) TABS, Take 1 tablet by mouth daily., Disp: , Rfl:  .  sertraline (ZOLOFT) 50 MG tablet, TAKE 1 TABLET (50 MG TOTAL) BY MOUTH DAILY., Disp: 90 tablet, Rfl: 1 .  azithromycin (ZITHROMAX) 250 MG tablet, Take 2 pills on day 1, then one pill the following four days., Disp: 6 tablet, Rfl: 0 .  dicyclomine (BENTYL) 20 MG tablet, Take 1 tablet (20 mg total) by mouth every 6 (six) hours as needed for spasms., Disp: 30 tablet, Rfl: 2 .  ondansetron (ZOFRAN) 4 MG tablet, Take 1 tablet (4 mg total) by mouth every 8 (eight) hours as needed for nausea or vomiting., Disp: 30 tablet, Rfl: 2  Review of Systems  Constitutional: Negative.   Gastrointestinal: Positive for abdominal distention, abdominal pain (Stomach cramping), diarrhea and nausea. Negative for anal bleeding, blood in stool, constipation, rectal pain and vomiting.  Psychiatric/Behavioral: The patient is nervous/anxious.     Social History   Tobacco Use  . Smoking status: Never Smoker  . Smokeless tobacco: Never Used  Substance Use Topics  . Alcohol use: Yes    Comment: Rarely   Objective:   BP 106/68 (BP Location: Right Arm, Patient Position: Sitting, Cuff Size: Normal)   Pulse 84   Temp 98.4 F (36.9 C) (Oral)   Resp 16   Wt 123 lb (55.8 kg)   BMI 23.24 kg/m  Vitals:  05/08/17 1200  BP: 106/68  Pulse: 84  Resp: 16  Temp: 98.4 F (36.9 C)  TempSrc: Oral  Weight: 123 lb (55.8 kg)     Physical Exam  Constitutional: She is oriented to person, place, and time. She appears well-developed and well-nourished.  Cardiovascular: Normal rate and regular rhythm.  Pulmonary/Chest: Effort normal and breath sounds normal.  Abdominal: Soft. Bowel sounds are normal. She exhibits no distension. There is no tenderness.  Neurological: She is alert and oriented to person, place, and time.  Skin: Skin is warm and dry.  Psychiatric: She has a normal mood and affect. Her  behavior is normal.        Assessment & Plan:     1. Abdominal cramping  Treated with Bentyl in the past with good relief. Will run Celiac serology to rule out intolerance. Encouraged trial of dairy elimination.  - dicyclomine (BENTYL) 20 MG tablet; Take 1 tablet (20 mg total) by mouth every 6 (six) hours as needed for spasms.  Dispense: 30 tablet; Refill: 2 - Tissue Transglutaminase Abs,IgG,IgA - IgA  2. Diarrhea, unspecified type  - Tissue Transglutaminase Abs,IgG,IgA - IgA  3. Nausea  - ondansetron (ZOFRAN) 4 MG tablet; Take 1 tablet (4 mg total) by mouth every 8 (eight) hours as needed for nausea or vomiting.  Dispense: 30 tablet; Refill: 2  4. Anxiety  Anxiety present with breakthrough, even on 50 mg zoloft. Advised she may increase to 75mg  or 100 mg daily over the next month as she sees fit.  Return if symptoms worsen or fail to improve.  The entirety of the information documented in the History of Present Illness, Review of Systems and Physical Exam were personally obtained by me. Portions of this information were initially documented by Ashley Royalty, CMA and reviewed by me for thoroughness and accuracy.           Trinna Post, PA-C  [facility]

## 2017-05-09 ENCOUNTER — Ambulatory Visit: Payer: 59 | Admitting: Physician Assistant

## 2017-05-10 LAB — TISSUE TRANSGLUTAMINASE ABS,IGG,IGA
Tissue Transglut Ab: <2 U/mL (ref 0–5)
Transglutaminase IgA: <2 U/mL (ref 0–3)

## 2017-05-10 LAB — IGA: IgA/Immunoglobulin A, Serum: 180 mg/dL (ref 87–352)

## 2017-05-13 ENCOUNTER — Telehealth: Payer: Self-pay

## 2017-05-13 NOTE — Telephone Encounter (Signed)
-----   Message from Trinna Post, [place] sent at 05/13/2017  1:13 PM EST ----- Celiac disease testing negative, would continue Bentyl as needed.

## 2017-05-13 NOTE — Telephone Encounter (Signed)
Pt advised.   Thanks,   -Shericka Johnstone  

## 2017-05-23 ENCOUNTER — Ambulatory Visit: Payer: 59 | Admitting: Physician Assistant

## 2017-05-23 ENCOUNTER — Encounter: Payer: Self-pay | Admitting: Physician Assistant

## 2017-05-23 VITALS — BP 112/72 | HR 98 | Temp 98.8°F | Resp 16 | Wt 123.0 lb

## 2017-05-23 DIAGNOSIS — R05 Cough: Secondary | ICD-10-CM | POA: Diagnosis not present

## 2017-05-23 DIAGNOSIS — R599 Enlarged lymph nodes, unspecified: Secondary | ICD-10-CM

## 2017-05-23 DIAGNOSIS — R0981 Nasal congestion: Secondary | ICD-10-CM

## 2017-05-23 DIAGNOSIS — J029 Acute pharyngitis, unspecified: Secondary | ICD-10-CM

## 2017-05-23 DIAGNOSIS — J111 Influenza due to unidentified influenza virus with other respiratory manifestations: Secondary | ICD-10-CM

## 2017-05-23 DIAGNOSIS — R69 Illness, unspecified: Principal | ICD-10-CM

## 2017-05-23 MED ORDER — OSELTAMIVIR PHOSPHATE 75 MG PO CAPS: 75.0000 mg | ORAL_CAPSULE | Freq: Two times a day (BID) | ORAL | 0 refills | Status: AC

## 2017-05-23 NOTE — Patient Instructions (Signed)

## 2017-05-23 NOTE — Progress Notes (Signed)
[place]  Chief Complaint  Patient presents with  . Influenza    Subjective:    Patient ID: Eileen Davis, female    DOB: Jan 11, 1991, 27 y.o.   MRN: [id number]  Upper Respiratory Infection: Eileen Davis is a 26 y.o. female with a past medical history significant for exposure to flu  complaining of symptoms of a URI. Symptoms include congestion, cough, sore throat and swollen glands. Onset of symptoms was 1 days ago, gradually worsening since that time. She also c/o achiness, congestion, nasal congestion, non productive cough and post nasal drip for the past 1 day .  She is drinking plenty of fluids. Evaluation to date: none. Treatment to date: cough suppressants. The treatment has provided no relief.   Review of Systems  Constitutional: Positive for chills, diaphoresis and fatigue. Negative for activity change, appetite change, fever and unexpected weight change.  HENT: Positive for congestion, postnasal drip and sore throat. Negative for ear discharge, ear pain, hearing loss, sinus pressure, sinus pain and tinnitus.   Respiratory: Positive for cough. Negative for apnea, choking, chest tightness, shortness of breath, wheezing and stridor.   Gastrointestinal: Negative.   Musculoskeletal: Positive for myalgias, neck pain and neck stiffness.  Neurological: Positive for headaches. Negative for dizziness and light-headedness.  Hematological: Positive for adenopathy.       Objective:   BP 112/72 (BP Location: Right Arm, Patient Position: Sitting, Cuff Size: Normal)   Pulse 98   Temp 98.8 F (37.1 C) (Oral)   Resp 16   Wt 123 lb (55.8 kg)   SpO2 98%   BMI 23.24 kg/m   Patient Active Problem List   Diagnosis Date Noted  . History of ovarian cyst 04/25/2016  . Infection of urinary tract 03/01/2015  . Reflux, vesicoureteral 03/01/2015    Outpatient Encounter Medications as of 05/23/2017  Medication Sig  . dicyclomine (BENTYL) 20 MG  tablet Take 1 tablet (20 mg total) by mouth every 6 (six) hours as needed for spasms.  Marland Kitchen ibuprofen (ADVIL,MOTRIN) 200 MG tablet Take 800 mg by mouth every 6 (six) hours as needed for mild pain.  Marland Kitchen levonorgestrel (MIRENA) 20 MCG/24HR IUD 1 each by Intrauterine route once.  . Multiple Vitamins-Minerals (HAIR SKIN AND NAILS FORMULA) TABS Take 1 tablet by mouth daily.  . ondansetron (ZOFRAN) 4 MG tablet Take 1 tablet (4 mg total) by mouth every 8 (eight) hours as needed for nausea or vomiting.  . sertraline (ZOLOFT) 50 MG tablet TAKE 1 TABLET (50 MG TOTAL) BY MOUTH DAILY.  Marland Kitchen azithromycin (ZITHROMAX) 250 MG tablet Take 2 pills on day 1, then one pill the following four days.  Marland Kitchen oseltamivir (TAMIFLU) 75 MG capsule Take 1 capsule (75 mg total) by mouth 2 (two) times daily for 5 days.   No facility-administered encounter medications on file as of 05/23/2017.     Allergies  Allergen Reactions  . Imitrex [Sumatriptan] Other (See Comments)    Had syncopal episode  . Sumatriptan Succinate Other (See Comments)    Had syncopal episode       Physical Exam  Constitutional: She is oriented to person, place, and time. She appears well-developed and well-nourished. She appears ill.  HENT:  Right Ear: Tympanic membrane and external ear normal.  Left Ear: Tympanic membrane and external ear normal.  Mouth/Throat: Posterior oropharyngeal edema and posterior oropharyngeal erythema present. No oropharyngeal exudate.  Ear canals erythematous bilaterally  Eyes: Conjunctivae are normal.  Cardiovascular: Normal rate, regular rhythm  and normal heart sounds.  Pulmonary/Chest: Effort normal and breath sounds normal.  Neurological: She is alert and oriented to person, place, and time.  Skin: Skin is warm and dry.  Psychiatric: She has a normal mood and affect. Her behavior is normal.       Assessment & Plan:  1. Influenza-like illness  - oseltamivir (TAMIFLU) 75 MG capsule; Take 1 capsule (75 mg total) by  mouth 2 (two) times daily for 5 days.  Dispense: 10 capsule; Refill: 0  Recommend rest, fluids, frequent hand washing. Work note provided  The entirety of the information documented in the History of Present Illness, Review of Systems and Physical Exam were personally obtained by me. Portions of this information were initially documented by Ashley Royalty, CMA and reviewed by me for thoroughness and accuracy.    Patient Instructions  Influenza, Adult Influenza ("the flu") is an infection in the lungs, nose, and throat (respiratory tract). It is caused by a virus. The flu causes many common cold symptoms, as well as a high fever and body aches. It can make you feel very sick. The flu spreads easily from person to person (is contagious). Getting a flu shot (influenza vaccination) every year is the best way to prevent the flu. Follow these instructions at home:  Take over-the-counter and prescription medicines only as told by your doctor.  Use a cool mist humidifier to add moisture (humidity) to the air in your home. This can make it easier to breathe.  Rest as needed.  Drink enough fluid to keep your pee (urine) clear or pale yellow.  Cover your mouth and nose when you cough or sneeze.  Wash your hands with soap and water often, especially after you cough or sneeze. If you cannot use soap and water, use hand sanitizer.  Stay home from work or school as told by your doctor. Unless you are visiting your doctor, try to avoid leaving home until your fever has been gone for 24 hours without the use of medicine.  Keep all follow-up visits as told by your doctor. This is important. How is this prevented?  Getting a yearly (annual) flu shot is the best way to avoid getting the flu. You may get the flu shot in late summer, fall, or winter. Ask your doctor when you should get your flu shot.  Wash your hands often or use hand sanitizer often.  Avoid contact with people who are sick during cold and  flu season.  Eat healthy foods.  Drink plenty of fluids.  Get enough sleep.  Exercise regularly. Contact a doctor if:  You get new symptoms.  You have: ? Chest pain. ? Watery poop (diarrhea). ? A fever.  Your cough gets worse.  You start to have more mucus.  You feel sick to your stomach (nauseous).  You throw up (vomit). Get help right away if:  You start to be short of breath or have trouble breathing.  Your skin or nails turn a bluish color.  You have very bad pain or stiffness in your neck.  You get a sudden headache.  You get sudden pain in your face or ear.  You cannot stop throwing up. This information is not intended to replace advice given to you by your health care provider. Make sure you discuss any questions you have with your health care provider. Document Released: 01/17/2008 Document Revised: 09/15/2015 Document Reviewed: 02/01/2015 Elsevier Interactive Patient Education  2017 [employer].

## 2017-05-24 DIAGNOSIS — J029 Acute pharyngitis, unspecified: Secondary | ICD-10-CM | POA: Diagnosis not present

## 2017-05-24 DIAGNOSIS — J0300 Acute streptococcal tonsillitis, unspecified: Secondary | ICD-10-CM | POA: Diagnosis not present

## 2017-05-24 DIAGNOSIS — J020 Streptococcal pharyngitis: Secondary | ICD-10-CM | POA: Diagnosis not present

## 2017-06-26 ENCOUNTER — Encounter: Payer: Self-pay | Admitting: Obstetrics and Gynecology

## 2017-06-26 ENCOUNTER — Ambulatory Visit (INDEPENDENT_AMBULATORY_CARE_PROVIDER_SITE_OTHER): Payer: 59 | Admitting: Obstetrics and Gynecology

## 2017-06-26 VITALS — BP 122/87 | HR 97 | Ht 62.0 in | Wt 124.1 lb

## 2017-06-26 DIAGNOSIS — Z01419 Encounter for gynecological examination (general) (routine) without abnormal findings: Secondary | ICD-10-CM

## 2017-06-26 DIAGNOSIS — Z975 Presence of (intrauterine) contraceptive device: Secondary | ICD-10-CM

## 2017-06-26 DIAGNOSIS — Z124 Encounter for screening for malignant neoplasm of cervix: Secondary | ICD-10-CM

## 2017-06-26 NOTE — Progress Notes (Signed)
Pt is doing well.

## 2017-06-26 NOTE — Progress Notes (Signed)
GYNECOLOGY ANNUAL PHYSICAL EXAM PROGRESS NOTE  Subjective:    Eileen Davis is a 27 y.o. G0P0 female who presents for an annual exam. The patient has no complaints today. The patient is sexually active, 1 partner, female.  The patient wears seatbelts: yes. The patient participates in regular exercise: yes. Has the patient ever been transfused or tattooed?: no. The patient reports that there is not domestic violence in her life.    Gynecologic History Menarche age: 75 Patient's last menstrual period was 06/21/2017. Contraception: Skyla IUD, inserted 05/23/2016.  History of STI's: Denies  Last Pap:  November 2016. Results were: normal.  Denies h/o abnormal pap smears.   Obstetric History   G0   P0   T0   P0   A0   L0    SAB0   TAB0   Ectopic0   Multiple0   Live Births0       Past Medical History:  Diagnosis Date  . Anxiety and depression   . Depression   . Migraine   . Recurrent UTI   . Ureteral reflux 2005   Right side    Past Surgical History:  Procedure Laterality Date  . CERVICAL BIOPSY  W/ LOOP ELECTRODE EXCISION    . WISDOM TOOTH EXTRACTION  06/2016    Family History  Problem Relation Age of Onset  . Osteoporosis Mother   . Diabetes Father   . Hypertension Father   . Healthy Brother   . ADD / ADHD Brother   . Ovarian cancer Maternal Grandmother   . Prostate cancer Maternal Grandfather   . Congestive Heart Failure Paternal Grandmother   . CVA Paternal Grandmother   . Lung cancer Paternal Grandfather     Social History   Socioeconomic History  . Marital status: Single    Spouse name: Not on file  . Number of children: Not on file  . Years of education: Not on file  . Highest education level: Not on file  Social Needs  . Financial resource strain: Not on file  . Food insecurity - worry: Not on file  . Food insecurity - inability: Not on file  . Transportation needs - medical: Not on file  . Transportation needs - non-medical: Not on file    Occupational History  . Not on file  Tobacco Use  . Smoking status: Never Smoker  . Smokeless tobacco: Never Used  Substance and Sexual Activity  . Alcohol use: Yes    Comment: Rarely  . Drug use: No  . Sexual activity: Yes    Birth control/protection: Condom, IUD    Comment: Skyla Inserted 05/23/2016  Other Topics Concern  . Not on file  Social History Narrative  . Not on file    Current Outpatient Medications on File Prior to Visit  Medication Sig Dispense Refill  . dicyclomine (BENTYL) 20 MG tablet Take 1 tablet (20 mg total) by mouth every 6 (six) hours as needed for spasms. 30 tablet 2  . ibuprofen (ADVIL,MOTRIN) 200 MG tablet Take 800 mg by mouth every 6 (six) hours as needed for mild pain.    . Levonorgestrel (SKYLA) 13.5 MG IUD by Intrauterine route.    . Multiple Vitamins-Minerals (HAIR SKIN AND NAILS FORMULA) TABS Take 1 tablet by mouth daily.    . ondansetron (ZOFRAN) 4 MG tablet Take 1 tablet (4 mg total) by mouth every 8 (eight) hours as needed for nausea or vomiting. 30 tablet 2  . sertraline (ZOLOFT) 50 MG tablet  TAKE 1 TABLET (50 MG TOTAL) BY MOUTH DAILY. (Patient taking differently: Take 50 mg by mouth daily. Take 1 1/2 tablet once daily.) 90 tablet 1   No current facility-administered medications on file prior to visit.     Allergies  Allergen Reactions  . Imitrex [Sumatriptan] Other (See Comments)    Had syncopal episode  . Sumatriptan Succinate Other (See Comments)    Had syncopal episode     Review of Systems Constitutional: negative for chills, fatigue, fevers and sweats Eyes: negative for irritation, redness and visual disturbance Ears, nose, mouth, throat, and face: negative for hearing loss, nasal congestion, snoring and tinnitus Respiratory: negative for asthma, cough, sputum Cardiovascular: negative for chest pain, dyspnea, exertional chest pressure/discomfort, irregular heart beat, palpitations and syncope Gastrointestinal: negative for  abdominal pain, change in bowel habits, nausea and vomiting Genitourinary:  Negative for abnormal menstrual periods, genital lesions, sexual problems and vaginal discharge, dysuria and urinary incontinence Integument/breast: negative for breast lump, breast tenderness and nipple discharge Hematologic/lymphatic: negative for bleeding and easy bruising Musculoskeletal:negative for back pain and muscle weakness Neurological:  Negative for headaches, dizziness, vertigo and weakness Endocrine: negative for diabetic symptoms including polydipsia, polyuria and skin dryness Allergic/Immunologic: negative for hay fever and urticaria      Objective:  Blood pressure 122/87, pulse 97, height 5\' 2"  (1.575 m), weight 124 lb 1.6 oz (56.3 kg), last menstrual period 06/21/2017. Body mass index is 22.7 kg/m.   General Appearance:    Alert, cooperative, no distress, appears stated age  Head:    Normocephalic, without obvious abnormality, atraumatic  Eyes:    PERRL, conjunctiva/corneas clear, EOM's intact, both eyes  Ears:    Normal external ear canals, both ears  Nose:   Nares normal, septum midline, mucosa normal, no drainage or sinus tenderness  Throat:   Lips, mucosa, and tongue normal; teeth and gums normal  Neck:   Supple, symmetrical, trachea midline, no adenopathy; thyroid: no enlargement/tenderness/nodules; no carotid bruit or JVD  Back:     Symmetric, no curvature, ROM normal, no CVA tenderness  Lungs:     Clear to auscultation bilaterally, respirations unlabored  Chest Wall:    No tenderness or deformity   Heart:    Regular rate and rhythm, S1 and S2 normal, no murmur, rub or gallop  Breast Exam:    No tenderness, masses, or nipple abnormality  Abdomen:     Soft, non-tender, bowel sounds active all four quadrants, no masses, no organomegaly.    Genitalia:    Pelvic:external genitalia normal, vagina without lesions, discharge, or tenderness, rectovaginal septum  normal. Cervix normal in appearance,  no cervical motion tenderness, no adnexal masses or tenderness. IUD threads visualized from external cervical os, ~ 3 cm in length.  Uterus normal size, shape, mobile, regular contours, nontender.  Rectal:    Normal external sphincter.  No hemorrhoids appreciated. Internal exam not done.   Extremities:   Extremities normal, atraumatic, no cyanosis or edema  Pulses:   2+ and symmetric all extremities  Skin:   Skin color, texture, turgor normal, no rashes or lesions  Lymph nodes:   Cervical, supraclavicular, and axillary nodes normal  Neurologic:   CNII-XII intact, normal strength, sensation and reflexes throughout   .  Labs:  Lab Results  Component Value Date   WBC 5.3 10/25/2016   HGB 14.1 10/25/2016   HCT 40.3 10/25/2016   MCV 85 10/25/2016   PLT 200 10/25/2016    Lab Results  Component Value Date  CREATININE 0.66 10/25/2016   BUN 9 10/25/2016   NA 140 10/25/2016   K 5.0 10/25/2016   CL 104 10/25/2016   CO2 22 10/25/2016    Lab Results  Component Value Date   ALT 10 10/25/2016   AST 22 10/25/2016   ALKPHOS 117 10/25/2016   BILITOT 0.6 10/25/2016     Assessment:   Healthy female exam.  Cervical cancer screening IUD in place   Plan:    Labs: CBC and CMP ordered.  Breast self exam technique reviewed and patient encouraged to perform self-exam monthly. Contraception: Skyla IUD.  Discussed healthy lifestyle modifications. Pap smear performed today.  Up to date on flu vaccine   Has had Gardasil series.  Follow up in 1 year for annual exam.    Rubie Maid, MD [facility]

## 2017-06-27 LAB — COMPREHENSIVE METABOLIC PANEL
ALK PHOS: 120 IU/L — AB (ref 39–117)
ALT: 11 IU/L (ref 0–32)
AST: 19 IU/L (ref 0–40)
Albumin/Globulin Ratio: 1.9 (ref 1.2–2.2)
Albumin: 4.7 g/dL (ref 3.5–5.5)
BUN/Creatinine Ratio: 15 (ref 9–23)
BUN: 13 mg/dL (ref 6–20)
Bilirubin Total: 0.3 mg/dL (ref 0.0–1.2)
CO2: 25 mmol/L (ref 20–29)
Calcium: 9.2 mg/dL (ref 8.7–10.2)
Chloride: 101 mmol/L (ref 96–106)
Creatinine, Ser: 0.88 mg/dL (ref 0.57–1.00)
GFR calc Af Amer: 106 mL/min/{1.73_m2} (ref >59)
GFR calc non Af Amer: 92 mL/min/{1.73_m2} (ref >59)
GLOBULIN, TOTAL: 2.5 g/dL (ref 1.5–4.5)
GLUCOSE: 80 mg/dL (ref 65–99)
Potassium: 4.4 mmol/L (ref 3.5–5.2)
SODIUM: 141 mmol/L (ref 134–144)
Total Protein: 7.2 g/dL (ref 6.0–8.5)

## 2017-06-27 LAB — CBC
Hematocrit: 42.5 % (ref 34.0–46.6)
Hemoglobin: 13.7 g/dL (ref 11.1–15.9)
MCH: 29.4 pg (ref 26.6–33.0)
MCHC: 32.2 g/dL (ref 31.5–35.7)
MCV: 91 fL (ref 79–97)
PLATELETS: 261 10*3/uL (ref 150–379)
RBC: 4.66 x10E6/uL (ref 3.77–5.28)
RDW: 13.2 % (ref 12.3–15.4)
WBC: 9.1 10*3/uL (ref 3.4–10.8)

## 2017-06-28 LAB — PAP IG, CT-NG, RFX HPV ASCU
Chlamydia, Nuc. Acid Amp: NEGATIVE
Gonococcus by Nucleic Acid Amp: NEGATIVE
PAP Smear Comment: 0

## 2017-08-20 ENCOUNTER — Encounter: Payer: Self-pay | Admitting: Physician Assistant

## 2017-08-20 ENCOUNTER — Ambulatory Visit: Payer: 59 | Admitting: Physician Assistant

## 2017-08-20 VITALS — BP 118/72 | HR 80 | Temp 98.4°F | Resp 16

## 2017-08-20 DIAGNOSIS — G43109 Migraine with aura, not intractable, without status migrainosus: Secondary | ICD-10-CM

## 2017-08-20 MED ORDER — PROPRANOLOL HCL 40 MG PO TABS: 40.0000 mg | ORAL_TABLET | Freq: Every day | ORAL | 0 refills | Status: DC

## 2017-08-20 MED ORDER — SUMATRIPTAN SUCCINATE 50 MG PO TABS: ORAL_TABLET | ORAL | 0 refills | Status: DC

## 2017-08-20 NOTE — Patient Instructions (Signed)
B2 - 200 mg daily Magneisum oxide 400 mg if tolerated    Migraine Headache A migraine headache is a very strong throbbing pain on one side or both sides of your head. Migraines can also cause other symptoms. Talk with your doctor about what things may bring on (trigger) your migraine headaches. Follow these instructions at home: Medicines  Take over-the-counter and prescription medicines only as told by your doctor.  Do not drive or use heavy machinery while taking prescription pain medicine.  To prevent or treat constipation while you are taking prescription pain medicine, your doctor may recommend that you: ? Drink enough fluid to keep your pee (urine) clear or pale yellow. ? Take over-the-counter or prescription medicines. ? Eat foods that are high in fiber. These include fresh fruits and vegetables, whole grains, and beans. ? Limit foods that are high in fat and processed sugars. These include fried and sweet foods. Lifestyle  Avoid alcohol.  Do not use any products that contain nicotine or tobacco, such as cigarettes and e-cigarettes. If you need help quitting, ask your doctor.  Get at least 8 hours of sleep every night.  Limit your stress. General instructions   Keep a journal to find out what may bring on your migraines. For example, write down: ? What you eat and drink. ? How much sleep you get. ? Any change in what you eat or drink. ? Any change in your medicines.  If you have a migraine: ? Avoid things that make your symptoms worse, such as bright lights. ? It may help to lie down in a dark, quiet room. ? Do not drive or use heavy machinery. ? Ask your doctor what activities are safe for you.  Keep all follow-up visits as told by your doctor. This is important. Contact a doctor if:  You get a migraine that is different or worse than your usual migraines. Get help right away if:  Your migraine gets very bad.  You have a fever.  You have a stiff  neck.  You have trouble seeing.  Your muscles feel weak or like you cannot control them.  You start to lose your balance a lot.  You start to have trouble walking.  You pass out (faint). This information is not intended to replace advice given to you by your health care provider. Make sure you discuss any questions you have with your health care provider. Document Released: 01/17/2008 Document Revised: 10/28/2015 Document Reviewed: 09/26/2015 Elsevier Interactive Patient Education  2018 [employer].

## 2017-08-20 NOTE — Progress Notes (Signed)
Patient: Eileen Davis Female    DOB: 09/03/1990   27 y.o.   MRN: [id number] Visit Date: 08/22/2017  Today's Provider: Trinna Post, PA-C   Chief Complaint  Patient presents with  . Migraine    Started about two weeks ago.   Subjective:    Eileen Davis is a 27 y/o woman with history of migraines and insomnia presenting for the same.   She reports she has a history of migraines since high school. She describes the headaches as one sided and throbbing. She describes an aura of silver sparkles prior to her migraines. Currently, she reports 8 migraines per month. 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   She reports these improved with her IUD but then returned. She has tried excedrin and ibuprofen without relief. In the past has tried effexor which worsened her mood. Has also tried tramadol which causes drowsiness. She at one point took a friend's imitrex nasal spray in high school. She says she took one dose and later that night she felt flu like symptoms. The next day, her migraine was still present so she took another dose. Later that day she felt faint and may have passed out. This is why this medication is listed as an allergy in her chart. She has previously tried magnesium oxide and experienced diarrhea. She denies history of stroke, heart issues.  Migraine   This is a recurrent problem. The current episode started 1 to 4 weeks ago. The pain is located in the bilateral and frontal region. The pain radiates to the left neck and right neck. The pain quality is similar to prior headaches. The quality of the pain is described as aching and throbbing. Associated symptoms include dizziness, insomnia, nausea, neck pain, phonophobia, photophobia, scalp tenderness, a visual change and vomiting. Pertinent negatives include no abdominal pain, coughing, eye pain, eye  redness, fever, sinus pressure or swollen glands. The symptoms are aggravated by fatigue. She has tried Excedrin, NSAIDs, darkened room and cold packs for the symptoms.   Migraines for years, previously seen by neurology Migraine with visual aura - silver sparkles  excedrin and ibuprofen Disappeared with IUD Tramadol, effexor, imitrex Insomnia - one cup coffee     Allergies  Allergen Reactions  . Imitrex [Sumatriptan] Other (See Comments)    Had syncopal episode  . Sumatriptan Succinate Other (See Comments)    Had syncopal episode     Current Outpatient Medications:  .  ibuprofen (ADVIL,MOTRIN) 200 MG tablet, Take 800 mg by mouth every 6 (six) hours as needed for mild pain., Disp: , Rfl:  .  Levonorgestrel (SKYLA) 13.5 MG IUD, by Intrauterine route., Disp: , Rfl:  .  Multiple Vitamins-Minerals (HAIR SKIN AND NAILS FORMULA) TABS, Take 1 tablet by mouth daily., Disp: , Rfl:  .  sertraline (ZOLOFT) 50 MG tablet, TAKE 1 TABLET (50 MG TOTAL) BY MOUTH DAILY. (Patient taking differently: Take 50 mg by mouth daily. Take 1 1/2 tablet once daily.), Disp: 90 tablet, Rfl: 1 .  dicyclomine (BENTYL) 20 MG tablet, Take 1 tablet (20 mg total) by mouth every 6 (six) hours as needed for spasms., Disp: 30 tablet, Rfl: 2 .  ondansetron (ZOFRAN) 4 MG tablet, Take 1 tablet (4 mg total) by mouth every 8 (eight) hours as needed for nausea or vomiting. (Patient not taking: Reported on 08/20/2017),  Disp: 30 tablet, Rfl: 2 .  propranolol (INDERAL) 40 MG tablet, Take 1 tablet (40 mg total) by mouth daily., Disp: 90 tablet, Rfl: 0 .  SUMAtriptan (IMITREX) 50 MG tablet, Use 25 mg at first sign of migraine. May repeat in 2 hours if headache persists or recurs., Disp: 10 tablet, Rfl: 0  Review of Systems  Constitutional: Positive for fatigue. Negative for activity change, appetite change, chills, diaphoresis, fever and unexpected weight change.  HENT: Negative for sinus pressure and sinus pain.   Eyes: Positive for  photophobia and visual disturbance. Negative for pain, discharge, redness and itching.  Respiratory: Negative for cough.   Gastrointestinal: Positive for nausea and vomiting. Negative for abdominal distention, abdominal pain, anal bleeding, blood in stool, constipation, diarrhea and rectal pain.  Musculoskeletal: Positive for neck pain.  Neurological: Positive for dizziness, light-headedness and headaches.  Psychiatric/Behavioral: The patient has insomnia.     Social History   Tobacco Use  . Smoking status: Never Smoker  . Smokeless tobacco: Never Used  Substance Use Topics  . Alcohol use: Yes    Comment: Rarely   Objective:   BP 118/72 (BP Location: Right Arm, Patient Position: Sitting, Cuff Size: Normal)   Pulse 80   Temp 98.4 F (36.9 C) (Oral)   Resp 16  Vitals:   08/20/17 1608  BP: 118/72  Pulse: 80  Resp: 16  Temp: 98.4 F (36.9 C)  TempSrc: Oral     Physical Exam  Constitutional: She is oriented to person, place, and time. She appears well-developed and well-nourished.  Eyes: Pupils are equal, round, and reactive to light. Conjunctivae and EOM are normal.  Cardiovascular: Normal rate and regular rhythm.  Pulmonary/Chest: Effort normal and breath sounds normal.  Neurological: She is alert and oriented to person, place, and time.  Skin: Skin is warm and dry.  Psychiatric: She has a normal mood and affect. Her behavior is normal.        Assessment & Plan:     1. Migraine with aura and without status migrainosus, not intractable  Chronic, worsening and requiring prophylaxis. She can take 200 mg B2 daily, 400 mg mag oxide daily if she tolerates this. I do not think her experience with imitrex represents a true allergy, she may have had viral syndrome already and been fatigued from that. I will give her this as a rescue. Can start propranolol at 40 mg QD and titrate as tolerated.   - SUMAtriptan (IMITREX) 50 MG tablet; Use 25 mg at first sign of migraine. May  repeat in 2 hours if headache persists or recurs.  Dispense: 10 tablet; Refill: 0 - propranolol (INDERAL) 40 MG tablet; Take 1 tablet (40 mg total) by mouth daily.  Dispense: 90 tablet; Refill: 0  Return in about 1 month (around 09/19/2017) for migraines.  The entirety of the information documented in the History of Present Illness, Review of Systems and Physical Exam were personally obtained by me. Portions of this information were initially documented by Tiburcio Pea, CMA and reviewed by me for thoroughness and accuracy.             Trinna Post, PA-C  [facility]

## 2017-08-30 ENCOUNTER — Other Ambulatory Visit: Payer: Self-pay | Admitting: Physician Assistant

## 2017-08-30 DIAGNOSIS — R110 Nausea: Secondary | ICD-10-CM

## 2017-08-30 DIAGNOSIS — R109 Unspecified abdominal pain: Secondary | ICD-10-CM

## 2017-08-30 NOTE — Telephone Encounter (Signed)
Needs refill on   Zofran 4 mg  Bentyl 20  [facility]  Pt's call back Is [phone]  Thanks teri

## 2017-09-02 MED ORDER — DICYCLOMINE HCL 20 MG PO TABS: 20.0000 mg | ORAL_TABLET | Freq: Four times a day (QID) | ORAL | 2 refills | Status: DC | PRN

## 2017-09-02 MED ORDER — ONDANSETRON HCL 4 MG PO TABS: 4.0000 mg | ORAL_TABLET | Freq: Three times a day (TID) | ORAL | 2 refills | Status: DC | PRN

## 2017-09-26 ENCOUNTER — Ambulatory Visit: Payer: Self-pay | Admitting: Physician Assistant

## 2017-09-26 ENCOUNTER — Encounter: Payer: Self-pay | Admitting: Physician Assistant

## 2017-09-26 ENCOUNTER — Ambulatory Visit: Payer: 59 | Admitting: Physician Assistant

## 2017-09-26 VITALS — BP 112/72 | HR 84 | Temp 98.4°F | Resp 16 | Wt 125.0 lb

## 2017-09-26 DIAGNOSIS — G43809 Other migraine, not intractable, without status migrainosus: Secondary | ICD-10-CM

## 2017-09-26 DIAGNOSIS — R002 Palpitations: Secondary | ICD-10-CM

## 2017-09-26 DIAGNOSIS — R000 Tachycardia, unspecified: Secondary | ICD-10-CM

## 2017-09-26 DIAGNOSIS — G43909 Migraine, unspecified, not intractable, without status migrainosus: Secondary | ICD-10-CM | POA: Insufficient documentation

## 2017-09-26 NOTE — Progress Notes (Signed)
Patient: Eileen Davis Female    DOB: [birth year]   27 y.o.   MRN: [id number] Visit Date: 09/26/2017  Today's Provider: Trinna Post, PA-C   Chief Complaint  Patient presents with  . Migraine    One month follow up  . Palpitations   Subjective:    Eileen Davis presents for follow up of below.   Migraines improved. She has used imitrex once or twice for breakthrough migraines without issues.   Reports more frequent episodes of tachycardia, sweaty with pulses in 160-190. This happens twice per week. Has happened in the past but not as frequently. Reports reduction in symptoms with vagal maneuvers. Denies syncope.    Migraine   This is a recurrent problem. The problem has been gradually improving (Pt started propranolol and reports a significate improvement. ). The pain quality is similar to prior headaches. Pertinent negatives include no dizziness.  Palpitations   This is a new problem. The problem occurs intermittently. The problem has been gradually worsening (Pt reports having "isolated" events in the past; but seems to be worsening since starting Propranolol. ). Pertinent negatives include no chest pain or dizziness.       Allergies  Allergen Reactions  . Imitrex [Sumatriptan] Other (See Comments)    Had syncopal episode  . Sumatriptan Succinate Other (See Comments)    Had syncopal episode     Current Outpatient Medications:  .  dicyclomine (BENTYL) 20 MG tablet, Take 1 tablet (20 mg total) by mouth every 6 (six) hours as needed for spasms., Disp: 30 tablet, Rfl: 2 .  ibuprofen (ADVIL,MOTRIN) 200 MG tablet, Take 800 mg by mouth every 6 (six) hours as needed for mild pain., Disp: , Rfl:  .  Levonorgestrel (SKYLA) 13.5 MG IUD, by Intrauterine route., Disp: , Rfl:  .  Multiple Vitamins-Minerals (HAIR SKIN AND NAILS FORMULA) TABS, Take 1 tablet by mouth daily., Disp: , Rfl:  .  ondansetron (ZOFRAN) 4 MG tablet, Take 1 tablet (4 mg total) by mouth every 8 (eight)  hours as needed for nausea or vomiting., Disp: 30 tablet, Rfl: 2 .  propranolol (INDERAL) 40 MG tablet, Take 1 tablet (40 mg total) by mouth daily., Disp: 90 tablet, Rfl: 0 .  sertraline (ZOLOFT) 50 MG tablet, TAKE 1 TABLET (50 MG TOTAL) BY MOUTH DAILY. (Patient taking differently: Take 50 mg by mouth daily. Take 1 1/2 tablet once daily.), Disp: 90 tablet, Rfl: 1 .  SUMAtriptan (IMITREX) 50 MG tablet, Use 25 mg at first sign of migraine. May repeat in 2 hours if headache persists or recurs., Disp: 10 tablet, Rfl: 0  Review of Systems  Constitutional: Negative.   Respiratory: Negative.   Cardiovascular: Positive for palpitations. Negative for chest pain and leg swelling.  Gastrointestinal: Negative.   Neurological: Negative for dizziness, light-headedness and headaches.  Psychiatric/Behavioral: Negative for sleep disturbance.    Social History   Tobacco Use  . Smoking status: Never Smoker  . Smokeless tobacco: Never Used  Substance Use Topics  . Alcohol use: Yes    Comment: Rarely   Objective:   BP 112/72 (BP Location: Right Arm, Patient Position: Sitting, Cuff Size: Normal)   Pulse 84   Temp 98.4 F (36.9 C) (Oral)   Resp 16   Wt 125 lb (56.7 kg)   BMI 22.86 kg/m  Vitals:   09/26/17 0955  BP: 112/72  Pulse: 84  Resp: 16  Temp: 98.4 F (36.9 C)  TempSrc: Oral  Weight: 125 lb (56.7 kg)     Physical Exam  Constitutional: She is oriented to person, place, and time. She appears well-developed and well-nourished.  Cardiovascular: Normal rate and regular rhythm.  Pulmonary/Chest: Effort normal and breath sounds normal.  Neurological: She is alert and oriented to person, place, and time.  Skin: Skin is warm and dry.  Psychiatric: She has a normal mood and affect. Her behavior is normal.        Assessment & Plan:     1. Other migraine without status migrainosus, not intractable  Improved. Can increase to propranolol 40 mg BID. Message me back about how it's  working.  2. Heart palpitations  EKG NSR today, normal PR interval, no delta waves. If she would like further eval would need to see cardiology for Holter monitor. She declines at this point, can call back if she changes her mind.   - EKG 12-Lead  3. Tachycardia   Return if symptoms worsen or fail to improve.  The entirety of the information documented in the History of Present Illness, Review of Systems and Physical Exam were personally obtained by me. Portions of this information were initially documented by Ashley Royalty, CMA and reviewed by me for thoroughness and accuracy.          Trinna Post, PA-C  [facility]

## 2017-09-26 NOTE — Patient Instructions (Addendum)
Try taking 1 pill in th emorning and .5 pills in the afternoon. May increase to one pill twice daily of propranolol.    Migraine Headache A migraine headache is a very strong throbbing pain on one side or both sides of your head. Migraines can also cause other symptoms. Talk with your doctor about what things may bring on (trigger) your migraine headaches. Follow these instructions at home: Medicines  Take over-the-counter and prescription medicines only as told by your doctor.  Do not drive or use heavy machinery while taking prescription pain medicine.  To prevent or treat constipation while you are taking prescription pain medicine, your doctor may recommend that you: ? Drink enough fluid to keep your pee (urine) clear or pale yellow. ? Take over-the-counter or prescription medicines. ? Eat foods that are high in fiber. These include fresh fruits and vegetables, whole grains, and beans. ? Limit foods that are high in fat and processed sugars. These include fried and sweet foods. Lifestyle  Avoid alcohol.  Do not use any products that contain nicotine or tobacco, such as cigarettes and e-cigarettes. If you need help quitting, ask your doctor.  Get at least 8 hours of sleep every night.  Limit your stress. General instructions   Keep a journal to find out what may bring on your migraines. For example, write down: ? What you eat and drink. ? How much sleep you get. ? Any change in what you eat or drink. ? Any change in your medicines.  If you have a migraine: ? Avoid things that make your symptoms worse, such as bright lights. ? It may help to lie down in a dark, quiet room. ? Do not drive or use heavy machinery. ? Ask your doctor what activities are safe for you.  Keep all follow-up visits as told by your doctor. This is important. Contact a doctor if:  You get a migraine that is different or worse than your usual migraines. Get help right away if:  Your migraine gets  very bad.  You have a fever.  You have a stiff neck.  You have trouble seeing.  Your muscles feel weak or like you cannot control them.  You start to lose your balance a lot.  You start to have trouble walking.  You pass out (faint). This information is not intended to replace advice given to you by your health care provider. Make sure you discuss any questions you have with your health care provider. Document Released: 01/17/2008 Document Revised: 10/28/2015 Document Reviewed: 09/26/2015 Elsevier Interactive Patient Education  2018 [employer].

## 2017-10-15 ENCOUNTER — Telehealth: Payer: Self-pay | Admitting: Physician Assistant

## 2017-10-15 DIAGNOSIS — F419 Anxiety disorder, unspecified: Secondary | ICD-10-CM

## 2017-10-15 MED ORDER — SERTRALINE HCL 50 MG PO TABS: 75.0000 mg | ORAL_TABLET | Freq: Every day | ORAL | 0 refills | Status: DC

## 2017-10-15 NOTE — Telephone Encounter (Signed)
New rx sent for zoloft 75 mg.

## 2017-10-15 NOTE — Telephone Encounter (Signed)
Pt contacted office for refill request on the following medications:  sertraline (ZOLOFT) 50 MG tablet  [place]  Pt stated that the Rx is one 50 mg tablet once a day. Pt stated that she was advised she can take 75 mg. Pt stated that she has been taking 1.5 tablets once a day and has completely ran out of medication. Pt is requesting a new Rx for 50 mg 1.5 tablets per day or if she can get an Rx for 75 mg 1 tablet once a day. Please advise. Thanks TNP

## 2017-10-23 ENCOUNTER — Other Ambulatory Visit: Payer: Self-pay | Admitting: Physician Assistant

## 2017-10-23 DIAGNOSIS — G43109 Migraine with aura, not intractable, without status migrainosus: Secondary | ICD-10-CM

## 2017-10-23 MED ORDER — PROPRANOLOL HCL 40 MG PO TABS: 40.0000 mg | ORAL_TABLET | Freq: Two times a day (BID) | ORAL | 0 refills | Status: DC

## 2017-10-23 NOTE — Telephone Encounter (Signed)
Pt contacted office for refill request on the following medications:  propranolol (INDERAL) 40 MG tablet  90 day supply  [place]  Last Rx: 08/20/17  LOV: 09/26/17 Pt stated that her dosage had been changed from once a day to twice a day. Please advise. Thanks TNP

## 2017-11-12 ENCOUNTER — Encounter: Payer: Self-pay | Admitting: Physician Assistant

## 2017-11-12 ENCOUNTER — Ambulatory Visit: Payer: 59 | Admitting: Physician Assistant

## 2017-11-12 VITALS — BP 104/68 | HR 60 | Temp 98.5°F | Resp 16 | Wt 128.0 lb

## 2017-11-12 DIAGNOSIS — G43809 Other migraine, not intractable, without status migrainosus: Secondary | ICD-10-CM

## 2017-11-12 DIAGNOSIS — R41840 Attention and concentration deficit: Secondary | ICD-10-CM | POA: Diagnosis not present

## 2017-11-12 DIAGNOSIS — Z1339 Encounter for screening examination for other mental health and behavioral disorders: Secondary | ICD-10-CM

## 2017-11-12 DIAGNOSIS — Z1389 Encounter for screening for other disorder: Secondary | ICD-10-CM | POA: Diagnosis not present

## 2017-11-12 NOTE — Progress Notes (Deleted)
       Patient: Eileen Davis Female    DOB: 1991-04-15   27 y.o.   MRN: [id number] Visit Date: 11/12/2017  Today's Provider: Mar Daring, PA-C   No chief complaint on file.  Subjective:    HPI     No Active Allergies   Current Outpatient Medications:  .  dicyclomine (BENTYL) 20 MG tablet, Take 1 tablet (20 mg total) by mouth every 6 (six) hours as needed for spasms., Disp: 30 tablet, Rfl: 2 .  ibuprofen (ADVIL,MOTRIN) 200 MG tablet, Take 800 mg by mouth every 6 (six) hours as needed for mild pain., Disp: , Rfl:  .  Levonorgestrel (SKYLA) 13.5 MG IUD, by Intrauterine route., Disp: , Rfl:  .  Multiple Vitamins-Minerals (HAIR SKIN AND NAILS FORMULA) TABS, Take 1 tablet by mouth daily., Disp: , Rfl:  .  ondansetron (ZOFRAN) 4 MG tablet, Take 1 tablet (4 mg total) by mouth every 8 (eight) hours as needed for nausea or vomiting., Disp: 30 tablet, Rfl: 2 .  propranolol (INDERAL) 40 MG tablet, Take 1 tablet (40 mg total) by mouth 2 (two) times daily., Disp: 180 tablet, Rfl: 0 .  sertraline (ZOLOFT) 50 MG tablet, Take 1.5 tablets (75 mg total) by mouth daily., Disp: 135 tablet, Rfl: 0 .  SUMAtriptan (IMITREX) 50 MG tablet, Use 25 mg at first sign of migraine. May repeat in 2 hours if headache persists or recurs., Disp: 10 tablet, Rfl: 0  Review of Systems  Social History   Tobacco Use  . Smoking status: Never Smoker  . Smokeless tobacco: Never Used  Substance Use Topics  . Alcohol use: Yes    Comment: Rarely   Objective:   There were no vitals taken for this visit. There were no vitals filed for this visit.   Physical Exam      Assessment & Plan:           Mar Daring, PA-C  [facility]

## 2017-11-12 NOTE — Progress Notes (Signed)
Patient: Eileen Davis Female    DOB: 09-17-1990   27 y.o.   MRN: [id number] Visit Date: 11/12/2017  Today's Provider: Trinna Post, PA-C   Chief Complaint  Patient presents with  . Migraine    Pt reports propranolol is helping to prevent migraines. When she does have a migraine she states Imitrex does not work.  . ADHD    Pt would like to have an evaluation   Subjective:    HPI  Brother diagnosed with ADHD, wants to be referred for self referral. Reports worsening inattention symptoms within the past year, interfering with her job and can only handle 3 tasks per day. History of similar problems in college.  Also reporting that 25 mg imitrex not effective as abortive therapy for migraines. Does report propranolol is very effective at preventing migraines.     No Active Allergies   Current Outpatient Medications:  .  dicyclomine (BENTYL) 20 MG tablet, Take 1 tablet (20 mg total) by mouth every 6 (six) hours as needed for spasms., Disp: 30 tablet, Rfl: 2 .  ibuprofen (ADVIL,MOTRIN) 200 MG tablet, Take 800 mg by mouth every 6 (six) hours as needed for mild pain., Disp: , Rfl:  .  Levonorgestrel (SKYLA) 13.5 MG IUD, by Intrauterine route., Disp: , Rfl:  .  Multiple Vitamins-Minerals (HAIR SKIN AND NAILS FORMULA) TABS, Take 1 tablet by mouth daily., Disp: , Rfl:  .  ondansetron (ZOFRAN) 4 MG tablet, Take 1 tablet (4 mg total) by mouth every 8 (eight) hours as needed for nausea or vomiting., Disp: 30 tablet, Rfl: 2 .  propranolol (INDERAL) 40 MG tablet, Take 1 tablet (40 mg total) by mouth 2 (two) times daily., Disp: 180 tablet, Rfl: 0 .  sertraline (ZOLOFT) 50 MG tablet, Take 1.5 tablets (75 mg total) by mouth daily., Disp: 135 tablet, Rfl: 0 .  SUMAtriptan (IMITREX) 50 MG tablet, Use 25 mg at first sign of migraine. May repeat in 2 hours if headache persists or recurs., Disp: 10 tablet, Rfl: 0  Review of Systems  Social History   Tobacco Use  . Smoking status: Never  Smoker  . Smokeless tobacco: Never Used  Substance Use Topics  . Alcohol use: Yes    Comment: Rarely   Objective:   BP 104/68 (BP Location: Right Arm, Patient Position: Sitting, Cuff Size: Normal)   Pulse 60   Temp 98.5 F (36.9 C) (Oral)   Resp 16   Wt 128 lb (58.1 kg)   BMI 23.41 kg/m  Vitals:   11/12/17 1206  BP: 104/68  Pulse: 60  Resp: 16  Temp: 98.5 F (36.9 C)  TempSrc: Oral  Weight: 128 lb (58.1 kg)     Physical Exam  Constitutional: She is oriented to person, place, and time. She appears well-developed and well-nourished.  Cardiovascular: Normal rate.  Pulmonary/Chest: Effort normal.  Neurological: She is alert and oriented to person, place, and time.  Skin: Skin is warm and dry.  Psychiatric: She has a normal mood and affect. Her behavior is normal.        Assessment & Plan:     1. Inattention  Refer to psychology, if diagnosed will begin medication management.  - Ambulatory referral to Psychology  2. ADHD (attention deficit hyperactivity disorder) evaluation  - Ambulatory referral to Psychology  3. Other migraine without status migrainosus, not intractable  Chronic issue still uncontrolled. Continue propranolol. Can take 50 mg imitrex at first sign of headache and  then once two hours later.  Return if symptoms worsen or fail to improve.  The entirety of the information documented in the History of Present Illness, Review of Systems and Physical Exam were personally obtained by me. Portions of this information were initially documented by Ashley Royalty, CMA and reviewed by me for thoroughness and accuracy.          Trinna Post, PA-C  [facility]

## 2017-11-12 NOTE — Patient Instructions (Signed)
Can increase imitrex to 50 mg at start of migraine and can take another 50 mg in another two hours if headache has not improved.    Attention Deficit Hyperactivity Disorder, Pediatric Attention deficit hyperactivity disorder (ADHD) is a condition that can make it hard for a child to pay attention and concentrate or to control his or her behavior. The child may also have a lot of energy. ADHD is a disorder of the brain (neurodevelopmental disorder), and symptoms are typically first seen in early childhood. It is a common reason for behavioral and academic problems in school. There are three main types of ADHD:  Inattentive. With this type, children have difficulty paying attention.  Hyperactive-impulsive. With this type, children have a lot of energy and have difficulty controlling their behavior.  Combination. This type involves having symptoms of both of the other types.  ADHD is a lifelong condition. If it is not treated, the disorder can affect a child's future academic achievement, employment, and relationships. What are the causes? The exact cause of this condition is not known. What increases the risk? This condition is more likely to develop in:  Children who have a first-degree relative, such as a parent or brother or sister, with the condition.  Children who had a low birth weight.  Children whose mothers had problems during pregnancy or used alcohol or tobacco during pregnancy.  Children who have had a brain infection or a head injury.  Children who have been exposed to lead.  What are the signs or symptoms? Symptoms of this condition depend on the type of ADHD. Symptoms are listed here for each type: Inattentive  Problems with organization.  Difficulty staying focused.  Problems completing assignments at school.  Often making simple mistakes.  Problems sustaining mental effort.  Not listening to instructions.  Losing things often.  Forgetting things  often.  Being easily distracted. Hyperactive-impulsive  Fidgeting often.  Difficulty sitting still in one's seat.  Talking a lot.  Talking out of turn.  Interrupting others.  Difficulty relaxing or doing quiet activities.  High energy levels and constant movement.  Difficulty waiting.  Always "on the go." Combination  Having symptoms of both of the other types. Children with ADHD may feel frustrated with themselves and may find school to be particularly discouraging. They often perform below their abilities in school. As children get older, the excess movement can lessen, but the problems with paying attention and staying organized often continue. Most children do not outgrow ADHD, but with good treatment, they can learn to cope with the symptoms. How is this diagnosed? This condition is diagnosed based on a child's symptoms and academic history. The child's health care provider will do a complete assessment. As part of the assessment, the health care provider will ask the child questions and will ask the parents and teachers for their observations of the child. The health care provider looks for specific symptoms of ADHD. Diagnosis will include:  Ruling out other reasons for the child's behavior.  Reviewing behavior rating scales that have been filled out about the child by people who deal with the child on a daily basis.  A diagnosis is made only after all information from multiple people has been considered. How is this treated? Treatment for this condition may include:  Behavior therapy.  Medicines to decrease impulsivity and hyperactivity and to increase attention. Behavior therapy is preferred for children younger than 63 years old. The combination of medicine and behavior therapy is most effective for  children older than 12 years of age.  Tutoring or extra support at school.  Techniques for parents to use at home to help manage their child's symptoms and  behavior.  Follow these instructions at home: Eating and drinking  Offer your child a well-balanced diet. Breakfast that includes a balance of whole grains, protein, and fruits or vegetables is especially important for school performance.  If your child has trouble with hyperactivity, have your child avoid drinks that contain caffeine. These include: ? Soft drinks. ? Coffee. ? Tea.  If your child is older and finds that caffeinated drinks help to improve his or her attention, talk with your child's health care provider about what amount of caffeine intake is a safe for your child. Lifestyle   Make sure your child gets a full night of sleep and regular daily exercise.  Help manage your child's behavior by following the techniques learned in therapy. These may include: ? Looking for good behavior and rewarding it. ? Making rules for behavior that your child can understand and follow. ? Giving clear instructions. ? Responding consistently to your child's challenging behaviors. ? Setting realistic goals. ? Looking for activities that can lead to success and self-esteem. ? Making time for pleasant activities with your child. ? Giving lots of affection.  Help your child learn to be organized. Some ways to do this include: ? Keeping daily schedules the same. Have a regular wake-up time and bedtime for your child. Schedule all activities, including time for homework and time for play. Post the schedule in a place where your child will see it. Mark schedule changes in advance. ? Having a regular place for your child to store items such as clothing, backpacks, and school supplies. ? Encouraging your child to write down school assignments and to bring home needed books. Work with your child's teachers for assistance in organizing school work. General instructions  Learn as much as you can about ADHD. This will improve your ability to help your child and to make sure he or she gets the support  needed. It will also help you educate your child's teachers and instructors if they do not feel that they have adequate knowledge or experience in these areas.  Work with your child's teachers to make sure your child gets the support and extra help that is needed. This may include: ? Tutoring. ? Teacher cues to help your child remain on task. ? Seating changes so your child is working at a desk that is free from distractions.  Give over-the-counter and prescription medicines only as told by your child's health care provider.  Keep all follow-up visits as told by your health care provider. This is important. Contact a health care provider if:  Your child has repeated muscle twitches (tics), coughs, or speech outbursts.  Your child has sleep problems.  Your child has a marked loss of appetite.  Your child develops depression.  Your child has new or worsening behavioral problems.  Your child has dizziness.  Your child has a racing heart.  Your child has stomach pains.  Your child develops headaches. Get help right away if:  Your child talks about or threatens suicide.  You are worried that your child is having a bad reaction to a medicine that he or she is taking for ADHD. This information is not intended to replace advice given to you by your health care provider. Make sure you discuss any questions you have with your health care provider. Document Released:  03/30/2002 Document Revised: 12/07/2015 Document Reviewed: 11/03/2015 Elsevier Interactive Patient Education  [employer].

## 2018-01-10 ENCOUNTER — Ambulatory Visit (INDEPENDENT_AMBULATORY_CARE_PROVIDER_SITE_OTHER): Payer: 59 | Admitting: Obstetrics and Gynecology

## 2018-01-10 ENCOUNTER — Encounter: Payer: Self-pay | Admitting: Obstetrics and Gynecology

## 2018-01-10 VITALS — BP 104/69 | HR 78 | Ht 62.0 in | Wt 128.2 lb

## 2018-01-10 DIAGNOSIS — Z30432 Encounter for removal of intrauterine contraceptive device: Secondary | ICD-10-CM

## 2018-01-10 NOTE — Progress Notes (Signed)
Pt is present today to have her IUD removed. Pt stated that she has pain with sex and would like birth control after IUD removal.

## 2018-01-10 NOTE — Progress Notes (Signed)
     GYNECOLOGY OFFICE PROCEDURE NOTE  Eileen Davis is a 27 y.o. G0P0000 here for Mirena IUD removal. She complains of pelvic pain with intercourse, and has recurrent ovarian cysts.  She initially was doing well with the IUD, but symptoms have become progressively worse over the past several months.  Last pap smear was on 06/26/2017 and was normal.  IUD Removal  Patient identified, informed consent performed, consent signed.  Patient was in the dorsal lithotomy position, normal external genitalia was noted.  A speculum was placed in the patient's vagina, normal discharge was noted, no lesions. The cervix was visualized, no lesions, no abnormal discharge.  The strings of the IUD were grasped and pulled using ring forceps. The IUD was removed in its entirety.  Patient tolerated the procedure well.    Patient will use progesterone OCPs for contraception (has h/o migraines with aura so cannot use estrogen containing products).  Routine preventative health maintenance measures emphasized. Advised to have back up method for 1 week due to transitioning methods. Can start on Sunday.     Rubie Maid, MD [facility]

## 2018-01-10 NOTE — Patient Instructions (Signed)
Oral Contraception Use Oral contraceptive pills (OCPs) are medicines taken to prevent pregnancy. OCPs work by preventing the ovaries from releasing eggs. The hormones in OCPs also cause the cervical mucus to thicken, preventing the sperm from entering the uterus. The hormones also cause the uterine lining to become thin, not allowing a fertilized egg to attach to the inside of the uterus. OCPs are highly effective when taken exactly as prescribed. However, OCPs do not prevent sexually transmitted diseases (STDs). Safe sex practices, such as using condoms along with an OCP, can help prevent STDs. Before taking OCPs, you may have a physical exam and Pap test. Your health care provider may also order blood tests if necessary. Your health care provider will make sure you are a good candidate for oral contraception. Discuss with your health care provider the possible side effects of the OCP you may be prescribed. When starting an OCP, it can take 2 to 3 months for the body to adjust to the changes in hormone levels in your body. How to take oral contraceptive pills Your health care provider may advise you on how to start taking the first cycle of OCPs. Otherwise, you can:  Start on day 1 of your menstrual period. You will not need any backup contraceptive protection with this start time.  Start on the first Sunday after your menstrual period or the day you get your prescription. In these cases, you will need to use backup contraceptive protection for the first week.  Start the pill at any time of your cycle. If you take the pill within 5 days of the start of your period, you are protected against pregnancy right away. In this case, you will not need a backup form of birth control. If you start at any other time of your menstrual cycle, you will need to use another form of birth control for 7 days. If your OCP is the type called a minipill, it will protect you from pregnancy after taking it for 2 days (48  hours).  After you have started taking OCPs:  If you forget to take 1 pill, take it as soon as you remember. Take the next pill at the regular time.  If you miss 2 or more pills, call your health care provider because different pills have different instructions for missed doses. Use backup birth control until your next menstrual period starts.  If you use a 28-day pack that contains inactive pills and you miss 1 of the last 7 pills (pills with no hormones), it will not matter. Throw away the rest of the non-hormone pills and start a new pill pack.  No matter which day you start the OCP, you will always start a new pack on that same day of the week. Have an extra pack of OCPs and a backup contraceptive method available in case you miss some pills or lose your OCP pack. Follow these instructions at home:  Do not smoke.  Always use a condom to protect against STDs. OCPs do not protect against STDs.  Use a calendar to mark your menstrual period days.  Read the information and directions that came with your OCP. Talk to your health care provider if you have questions. Contact a health care provider if:  You develop nausea and vomiting.  You have abnormal vaginal discharge or bleeding.  You develop a rash.  You miss your menstrual period.  You are losing your hair.  You need treatment for mood swings or depression.  You   get dizzy when taking the OCP.  You develop acne from taking the OCP.  You become pregnant. Get help right away if:  You develop chest pain.  You develop shortness of breath.  You have an uncontrolled or severe headache.  You develop numbness or slurred speech.  You develop visual problems.  You develop pain, redness, and swelling in the legs. This information is not intended to replace advice given to you by your health care provider. Make sure you discuss any questions you have with your health care provider. Document Released: 03/29/2011 Document  Revised: 09/15/2015 Document Reviewed: 09/28/2012 Elsevier Interactive Patient Education  2017 [employer]  

## 2018-01-15 ENCOUNTER — Other Ambulatory Visit: Payer: Self-pay | Admitting: Physician Assistant

## 2018-01-15 DIAGNOSIS — G43109 Migraine with aura, not intractable, without status migrainosus: Secondary | ICD-10-CM

## 2018-01-15 DIAGNOSIS — F419 Anxiety disorder, unspecified: Secondary | ICD-10-CM

## 2018-01-15 MED ORDER — PROPRANOLOL HCL 40 MG PO TABS: 40.0000 mg | ORAL_TABLET | Freq: Two times a day (BID) | ORAL | 0 refills | Status: DC

## 2018-01-15 MED ORDER — SERTRALINE HCL 50 MG PO TABS: 75.0000 mg | ORAL_TABLET | Freq: Every day | ORAL | 0 refills | Status: DC

## 2018-01-15 NOTE — Telephone Encounter (Signed)
Pt calling requesting a refill on the following medications. Pt use's [facility] pharmacy. Thanks CC  propranolol (INDERAL) 40 MG tablet   sertraline (ZOLOFT) 50 MG tablet

## 2018-01-16 MED ORDER — SERTRALINE HCL 50 MG PO TABS: 75.0000 mg | ORAL_TABLET | Freq: Every day | ORAL | 0 refills | Status: DC

## 2018-01-16 MED ORDER — PROPRANOLOL HCL 40 MG PO TABS: 40.0000 mg | ORAL_TABLET | Freq: Two times a day (BID) | ORAL | 0 refills | Status: DC

## 2018-01-24 ENCOUNTER — Telehealth: Payer: Self-pay | Admitting: Obstetrics and Gynecology

## 2018-01-24 MED ORDER — DROSPIRENONE 4 MG PO TABS: 4.0000 mg | ORAL_TABLET | Freq: Every day | ORAL | 11 refills | Status: DC

## 2018-01-24 NOTE — Telephone Encounter (Signed)
Th patient called and stated that she would like Dr. Marcelline Mates to write her a prescription for her birth control. The patient did not disclose any other information but is expecting a call back from her nurse . Please advise.

## 2018-01-24 NOTE — Addendum Note (Signed)
Addended by: Edwyna Shell on: 01/24/2018 04:48 PM   Modules accepted: Orders

## 2018-01-24 NOTE — Telephone Encounter (Signed)
Pt had her IUD removed last month and was given samples of BC from the office. Pt was calling to ask for a prescription for those [facility]. Medication was sent in to the pharmacy of pt choice.

## 2018-03-05 ENCOUNTER — Telehealth: Payer: Self-pay | Admitting: Obstetrics and Gynecology

## 2018-03-05 MED ORDER — DROSPIRENONE 4 MG PO TABS: 4.0000 mg | ORAL_TABLET | Freq: Every day | ORAL | 3 refills | Status: DC

## 2018-03-05 NOTE — Telephone Encounter (Signed)
The patient called and stated that she needs a birth control refill supply of 3 months. The patient did not disclose any other information. Please advise.

## 2018-03-05 NOTE — Telephone Encounter (Signed)
Spoke with pt about her refill. Pt stated that she wanted to have a 3 month supply of her birth control and sent to [facility] in [place].

## 2018-03-26 DIAGNOSIS — R41840 Attention and concentration deficit: Secondary | ICD-10-CM | POA: Diagnosis not present

## 2018-03-26 DIAGNOSIS — F902 Attention-deficit hyperactivity disorder, combined type: Secondary | ICD-10-CM | POA: Diagnosis not present

## 2018-03-26 DIAGNOSIS — F419 Anxiety disorder, unspecified: Secondary | ICD-10-CM | POA: Diagnosis not present

## 2018-03-26 DIAGNOSIS — F338 Other recurrent depressive disorders: Secondary | ICD-10-CM | POA: Diagnosis not present

## 2018-03-26 DIAGNOSIS — Z79899 Other long term (current) drug therapy: Secondary | ICD-10-CM | POA: Diagnosis not present

## 2018-04-25 ENCOUNTER — Telehealth: Payer: Self-pay | Admitting: Obstetrics and Gynecology

## 2018-04-25 ENCOUNTER — Other Ambulatory Visit: Payer: Self-pay

## 2018-04-25 DIAGNOSIS — G43109 Migraine with aura, not intractable, without status migrainosus: Secondary | ICD-10-CM

## 2018-04-25 DIAGNOSIS — F419 Anxiety disorder, unspecified: Secondary | ICD-10-CM

## 2018-04-25 MED ORDER — PROPRANOLOL HCL 40 MG PO TABS: 40.0000 mg | ORAL_TABLET | Freq: Two times a day (BID) | ORAL | 0 refills | Status: DC

## 2018-04-25 MED ORDER — SERTRALINE HCL 50 MG PO TABS: 75.0000 mg | ORAL_TABLET | Freq: Every day | ORAL | 0 refills | Status: DC

## 2018-04-25 MED ORDER — DROSPIRENONE 4 MG PO TABS: 4.0000 mg | ORAL_TABLET | Freq: Every day | ORAL | 3 refills | Status: DC

## 2018-04-25 NOTE — Addendum Note (Signed)
Addended by: Edwyna Shell on: 04/25/2018 04:17 PM   Modules accepted: Orders

## 2018-04-25 NOTE — Telephone Encounter (Signed)
Patient called requesting a refill on birth control. She states she typically gets a 3 month supply. She changed her pharmacy to [facility], [place].Thanks

## 2018-04-25 NOTE — Telephone Encounter (Signed)
Pt was called back and informed via voicemail that her medication was sent to [facility] in [place].

## 2018-05-02 ENCOUNTER — Other Ambulatory Visit: Payer: Self-pay | Admitting: Physician Assistant

## 2018-05-02 DIAGNOSIS — F909 Attention-deficit hyperactivity disorder, unspecified type: Secondary | ICD-10-CM | POA: Insufficient documentation

## 2018-05-19 DIAGNOSIS — F419 Anxiety disorder, unspecified: Secondary | ICD-10-CM | POA: Diagnosis not present

## 2018-05-19 DIAGNOSIS — F902 Attention-deficit hyperactivity disorder, combined type: Secondary | ICD-10-CM | POA: Diagnosis not present

## 2018-05-19 DIAGNOSIS — Z79899 Other long term (current) drug therapy: Secondary | ICD-10-CM | POA: Diagnosis not present

## 2018-05-19 DIAGNOSIS — F338 Other recurrent depressive disorders: Secondary | ICD-10-CM | POA: Diagnosis not present

## 2018-05-26 ENCOUNTER — Other Ambulatory Visit: Payer: Self-pay

## 2018-05-26 MED ORDER — DROSPIRENONE 4 MG PO TABS: 4.0000 mg | ORAL_TABLET | Freq: Every day | ORAL | 3 refills | Status: DC

## 2018-05-27 ENCOUNTER — Telehealth: Payer: Self-pay | Admitting: Obstetrics and Gynecology

## 2018-05-27 NOTE — Telephone Encounter (Signed)
Patient called requesting a sample pack of BC until something else can be sent in that is covered by her insurance. See mychart message sent by patient. Thanks

## 2018-05-29 ENCOUNTER — Other Ambulatory Visit: Payer: Self-pay

## 2018-05-29 MED ORDER — DROSPIRENONE-ETHINYL ESTRADIOL 3-0.03 MG PO TABS: 1.0000 | ORAL_TABLET | Freq: Every day | ORAL | 0 refills | Status: DC

## 2018-05-29 NOTE — Telephone Encounter (Signed)
Pt called to inform her that her medication has been refilled and sent to [facility] in [place].

## 2018-07-03 ENCOUNTER — Encounter: Payer: Self-pay | Admitting: Obstetrics and Gynecology

## 2018-07-03 ENCOUNTER — Other Ambulatory Visit: Payer: Self-pay

## 2018-07-03 ENCOUNTER — Ambulatory Visit (INDEPENDENT_AMBULATORY_CARE_PROVIDER_SITE_OTHER): Payer: 59 | Admitting: Obstetrics and Gynecology

## 2018-07-03 VITALS — BP 114/80 | HR 87 | Ht 62.0 in | Wt 116.0 lb

## 2018-07-03 DIAGNOSIS — Z3041 Encounter for surveillance of contraceptive pills: Secondary | ICD-10-CM

## 2018-07-03 DIAGNOSIS — Z01419 Encounter for gynecological examination (general) (routine) without abnormal findings: Secondary | ICD-10-CM | POA: Diagnosis not present

## 2018-07-03 MED ORDER — DROSPIRENONE-ETHINYL ESTRADIOL 3-0.03 MG PO TABS: 1.0000 | ORAL_TABLET | Freq: Every day | ORAL | 4 refills | Status: DC

## 2018-07-03 NOTE — Progress Notes (Signed)
Pt is present today for annual exam. Pt stated having heavy cycles and clots.  Pt stated that she is doing well.

## 2018-07-03 NOTE — Patient Instructions (Addendum)
Preventive Care 18-39 Years, Female °Preventive care refers to lifestyle choices and visits with your health care provider that can promote health and wellness. °What does preventive care include? ° °· A yearly physical exam. This is also called an annual well check. °· Dental exams once or twice a year. °· Routine eye exams. Ask your health care provider how often you should have your eyes checked. °· Personal lifestyle choices, including: °? Daily care of your teeth and gums. °? Regular physical activity. °? Eating a healthy diet. °? Avoiding tobacco and drug use. °? Limiting alcohol use. °? Practicing safe sex. °? Taking vitamin and mineral supplements as recommended by your health care provider. °What happens during an annual well check? °The services and screenings done by your health care provider during your annual well check will depend on your age, overall health, lifestyle risk factors, and family history of disease. °Counseling °Your health care provider may ask you questions about your: °· Alcohol use. °· Tobacco use. °· Drug use. °· Emotional well-being. °· Home and relationship well-being. °· Sexual activity. °· Eating habits. °· Work and work environment. °· Method of birth control. °· Menstrual cycle. °· Pregnancy history. °Screening °You may have the following tests or measurements: °· Height, weight, and BMI. °· Diabetes screening. This is done by checking your blood sugar (glucose) after you have not eaten for a while (fasting). °· Blood pressure. °· Lipid and cholesterol levels. These may be checked every 5 years starting at age 20. °· Skin check. °· Hepatitis C blood test. °· Hepatitis B blood test. °· Sexually transmitted disease (STD) testing. °· BRCA-related cancer screening. This may be done if you have a family history of breast, ovarian, tubal, or peritoneal cancers. °· Pelvic exam and Pap test. This may be done every 3 years starting at age 21. Starting at age 30, this may be done every 5  years if you have a Pap test in combination with an HPV test. °Discuss your test results, treatment options, and if necessary, the need for more tests with your health care provider. °Vaccines °Your health care provider may recommend certain vaccines, such as: °· Influenza vaccine. This is recommended every year. °· Tetanus, diphtheria, and acellular pertussis (Tdap, Td) vaccine. You may need a Td booster every 10 years. °· Varicella vaccine. You may need this if you have not been vaccinated. °· HPV vaccine. If you are 26 or younger, you may need three doses over 6 months. °· Measles, mumps, and rubella (MMR) vaccine. You may need at least one dose of MMR. You may also need a second dose. °· Pneumococcal 13-valent conjugate (PCV13) vaccine. You may need this if you have certain conditions and were not previously vaccinated. °· Pneumococcal polysaccharide (PPSV23) vaccine. You may need one or two doses if you smoke cigarettes or if you have certain conditions. °· Meningococcal vaccine. One dose is recommended if you are age 19-21 years and a first-year college student living in a residence hall, or if you have one of several medical conditions. You may also need additional booster doses. °· Hepatitis A vaccine. You may need this if you have certain conditions or if you travel or work in places where you may be exposed to hepatitis A. °· Hepatitis B vaccine. You may need this if you have certain conditions or if you travel or work in places where you may be exposed to hepatitis B. °· Haemophilus influenzae type b (Hib) vaccine. You may need this if you   have certain risk factors. Talk to your health care provider about which screenings and vaccines you need and how often you need them. This information is not intended to replace advice given to you by your health care provider. Make sure you discuss any questions you have with your health care provider. Document Released: 06/05/2001 Document Revised: 11/20/2016  Document Reviewed: 02/08/2015 Elsevier Interactive Patient Education  2019 [place] Breast self-awareness means:  Knowing how your breasts look.  Knowing how your breasts feel.  Checking your breasts every month for changes.  Telling your doctor if you notice a change in your breasts. Breast self-awareness allows you to notice a breast problem early while it is still small. How to do a breast self-exam One way to learn what is normal for your breasts and to check for changes is to do a breast self-exam. To do a breast self-exam: Look for Changes  1. Take off all the clothes above your waist. 2. Stand in front of a mirror in a room with good lighting. 3. Put your hands on your hips. 4. Push your hands down. 5. Look at your breasts and nipples in the mirror to see if one breast or nipple looks different than the other. Check to see if: ? The shape of one breast is different. ? The size of one breast is different. ? There are wrinkles, dips, and bumps in one breast and not the other. 6. Look at each breast for changes in your skin, such as: ? Redness. ? Scaly areas. 7. Look for changes in your nipples, such as: ? Liquid around the nipples. ? Bleeding. ? Dimpling. ? Redness. ? A change in where the nipples are. Feel for Changes 1. Lie on your back on the floor. 2. Feel each breast. To do this, follow these steps: ? Pick a breast to feel. ? Put the arm closest to that breast above your head. ? Use your other arm to feel the nipple area of your breast. Feel the area with the pads of your three middle fingers by making small circles with your fingers. For the first circle, press lightly. For the second circle, press harder. For the third circle, press even harder. ? Keep making circles with your fingers at the light, harder, and even harder pressures as you move down your breast. Stop when you feel your ribs. ? Move your fingers a little toward the center  of your body. ? Start making circles with your fingers again, this time going up until you reach your collarbone. ? Keep making up and down circles until you reach your armpit. Remember to keep using the three pressures. ? Feel the other breast in the same way. 3. Sit or stand in the shower or tub. 4. With soapy water on your skin, feel each breast the same way you did in step 2, when you were lying on the floor. Write Down What You Find After doing the self-exam, write down:  What is normal for each breast.  Any changes you find in each breast.  When you last had your period.  How often should I check my breasts? Check your breasts every month. If you are breastfeeding, the best time to check them is after you feed your baby or after you use a breast pump. If you get periods, the best time to check your breasts is 5-7 days after your period is over. When should I see my doctor? See your doctor if you notice:  A change in shape or size of your breasts or nipples.  A change in the skin of your breast or nipples, such as red or scaly skin.  Unusual fluid coming from your nipples.  A lump or thick area that was not there before.  Pain in your breasts.  Anything that concerns you. This information is not intended to replace advice given to you by your health care provider. Make sure you discuss any questions you have with your health care provider. Document Released: 09/26/2007 Document Revised: 09/15/2015 Document Reviewed: 02/27/2015 Elsevier Interactive Patient Education  2019 [place] 18-39 Years, Female Preventive care refers to lifestyle choices and visits with your health care provider that can promote health and wellness. What does preventive care include?   A yearly physical exam. This is also called an annual well check.  Dental exams once or twice a year.  Routine eye exams. Ask your health care provider how often you should have your eyes  checked.  Personal lifestyle choices, including: ? Daily care of your teeth and gums. ? Regular physical activity. ? Eating a healthy diet. ? Avoiding tobacco and drug use. ? Limiting alcohol use. ? Practicing safe sex. ? Taking vitamin and mineral supplements as recommended by your health care provider. What happens during an annual well check? The services and screenings done by your health care provider during your annual well check will depend on your age, overall health, lifestyle risk factors, and family history of disease. Counseling Your health care provider may ask you questions about your:  Alcohol use.  Tobacco use.  Drug use.  Emotional well-being.  Home and relationship well-being.  Sexual activity.  Eating habits.  Work and work Statistician.  Method of birth control.  Menstrual cycle.  Pregnancy history. Screening You may have the following tests or measurements:  Height, weight, and BMI.  Diabetes screening. This is done by checking your blood sugar (glucose) after you have not eaten for a while (fasting).  Blood pressure.  Lipid and cholesterol levels. These may be checked every 5 years starting at age 77.  Skin check.  Hepatitis C blood test.  Hepatitis B blood test.  Sexually transmitted disease (STD) testing.  BRCA-related cancer screening. This may be done if you have a family history of breast, ovarian, tubal, or peritoneal cancers.  Pelvic exam and Pap test. This may be done every 3 years starting at age 26. Starting at age 60, this may be done every 5 years if you have a Pap test in combination with an HPV test. Discuss your test results, treatment options, and if necessary, the need for more tests with your health care provider. Vaccines Your health care provider may recommend certain vaccines, such as:  Influenza vaccine. This is recommended every year.  Tetanus, diphtheria, and acellular pertussis (Tdap, Td) vaccine. You may  need a Td booster every 10 years.  Varicella vaccine. You may need this if you have not been vaccinated.  HPV vaccine. If you are 43 or younger, you may need three doses over 6 months.  Measles, mumps, and rubella (MMR) vaccine. You may need at least one dose of MMR. You may also need a second dose.  Pneumococcal 13-valent conjugate (PCV13) vaccine. You may need this if you have certain conditions and were not previously vaccinated.  Pneumococcal polysaccharide (PPSV23) vaccine. You may need one or two doses if you smoke cigarettes or if you have certain conditions.  Meningococcal vaccine. One dose  is recommended if you are age 37-21 years and a first-year college student living in a residence hall, or if you have one of several medical conditions. You may also need additional booster doses.  Hepatitis A vaccine. You may need this if you have certain conditions or if you travel or work in places where you may be exposed to hepatitis A.  Hepatitis B vaccine. You may need this if you have certain conditions or if you travel or work in places where you may be exposed to hepatitis B.  Haemophilus influenzae type b (Hib) vaccine. You may need this if you have certain risk factors. Talk to your health care provider about which screenings and vaccines you need and how often you need them. This information is not intended to replace advice given to you by your health care provider. Make sure you discuss any questions you have with your health care provider. Document Released: 06/05/2001 Document Revised: 11/20/2016 Document Reviewed: 02/08/2015 Elsevier Interactive Patient Education  2019 [employer].

## 2018-07-03 NOTE — Progress Notes (Signed)
GYNECOLOGY ANNUAL PHYSICAL EXAM PROGRESS NOTE  Subjective:    Eileen Davis is a 28 y.o. G0P0 female who presents for an annual exam. The patient has no complaints today. The patient is sexually active, 1 partner, female.  The patient wears seatbelts: yes. The patient participates in regular exercise: yes. Has the patient ever been transfused or tattooed?: no. The patient reports that there is not domestic violence in her life.    Gynecologic History Menarche age: 21 Patient's last menstrual period was 06/27/2018. Contraception: combined OCPs.  History of STI's: Denies  Last Pap:  06/26/2017. Results were: normal.  Remote h/o abnormal pap smear s/p LEEP.   OB History  Gravida Para Term Preterm AB Living  0 0 0 0 0 0  SAB TAB Ectopic Multiple Live Births  0 0 0 0 0    Past Medical History:  Diagnosis Date  . ADHD   . Anxiety and depression   . Depression   . Migraine   . Recurrent UTI   . Ureteral reflux 2005   Right side    Past Surgical History:  Procedure Laterality Date  . CERVICAL BIOPSY  W/ LOOP ELECTRODE EXCISION    . WISDOM TOOTH EXTRACTION  06/2016    Family History  Problem Relation Age of Onset  . Osteoporosis Mother   . Diabetes Father   . Hypertension Father   . Healthy Brother   . ADD / ADHD Brother   . Ovarian cancer Maternal Grandmother   . Prostate cancer Maternal Grandfather   . Congestive Heart Failure Paternal Grandmother   . CVA Paternal Grandmother   . Lung cancer Paternal Grandfather     Social History   Socioeconomic History  . Marital status: Single    Spouse name: Not on file  . Number of children: Not on file  . Years of education: Not on file  . Highest education level: Not on file  Occupational History  . Not on file  Social Needs  . Financial resource strain: Not on file  . Food insecurity:    Worry: Not on file    Inability: Not on file  . Transportation needs:    Medical: Not on file    Non-medical: Not on file   Tobacco Use  . Smoking status: Former Smoker    Packs/day: 0.00  . Smokeless tobacco: Never Used  Substance and Sexual Activity  . Alcohol use: Yes    Comment: Rarely  . Drug use: No  . Sexual activity: Yes    Birth control/protection: Condom, I.U.D.    Comment: Skyla Inserted 05/23/2016  Lifestyle  . Physical activity:    Days per week: Not on file    Minutes per session: Not on file  . Stress: Not on file  Relationships  . Social connections:    Talks on phone: Not on file    Gets together: Not on file    Attends religious service: Not on file    Active member of club or organization: Not on file    Attends meetings of clubs or organizations: Not on file    Relationship status: Not on file  . Intimate partner violence:    Fear of current or ex partner: Not on file    Emotionally abused: Not on file    Physically abused: Not on file    Forced sexual activity: Not on file  Other Topics Concern  . Not on file  Social History Narrative  . Not  on file    Current Outpatient Medications on File Prior to Visit  Medication Sig Dispense Refill  . amphetamine-dextroamphetamine (ADDERALL) 20 MG tablet Take 20 mg by mouth as needed. Only take medication when she works    . dicyclomine (BENTYL) 20 MG tablet Take 1 tablet (20 mg total) by mouth every 6 (six) hours as needed for spasms. 30 tablet 2  . drospirenone-ethinyl estradiol (YASMIN 28) 3-0.03 MG tablet Take 1 tablet by mouth daily. 3 Package 0  . ibuprofen (ADVIL,MOTRIN) 200 MG tablet Take 800 mg by mouth every 6 (six) hours as needed for mild pain.    . Multiple Vitamins-Minerals (HAIR SKIN AND NAILS FORMULA) TABS Take 1 tablet by mouth daily.    . ondansetron (ZOFRAN) 4 MG tablet Take 1 tablet (4 mg total) by mouth every 8 (eight) hours as needed for nausea or vomiting. 30 tablet 2  . propranolol (INDERAL) 40 MG tablet Take 1 tablet (40 mg total) by mouth 2 (two) times daily. 180 tablet 0  . sertraline (ZOLOFT) 50 MG tablet  Take 1.5 tablets (75 mg total) by mouth daily. 135 tablet 0  . SUMAtriptan (IMITREX) 50 MG tablet Use 25 mg at first sign of migraine. May repeat in 2 hours if headache persists or recurs. 10 tablet 0   No current facility-administered medications on file prior to visit.     No Known Allergies   Review of Systems Constitutional: negative for chills, fatigue, fevers and sweats Eyes: negative for irritation, redness and visual disturbance Ears, nose, mouth, throat, and face: negative for hearing loss, nasal congestion, snoring and tinnitus Respiratory: negative for asthma, cough, sputum Cardiovascular: negative for chest pain, dyspnea, exertional chest pressure/discomfort, irregular heart beat, palpitations and syncope Gastrointestinal: negative for abdominal pain, change in bowel habits, nausea and vomiting Genitourinary:  Negative for abnormal menstrual periods, genital lesions, sexual problems and vaginal discharge, dysuria and urinary incontinence Integument/breast: negative for breast lump, breast tenderness and nipple discharge Hematologic/lymphatic: negative for bleeding and easy bruising Musculoskeletal:negative for back pain and muscle weakness Neurological:  Negative for headaches, dizziness, vertigo and weakness Endocrine: negative for diabetic symptoms including polydipsia, polyuria and skin dryness Allergic/Immunologic: negative for hay fever and urticaria      Objective:  Blood pressure 114/80, pulse 87, height 5\' 2"  (1.575 m), weight 116 lb (52.6 kg), last menstrual period 06/27/2018. Body mass index is 21.22 kg/m.   General Appearance:    Alert, cooperative, no distress, appears stated age  Head:    Normocephalic, without obvious abnormality, atraumatic  Eyes:    PERRL, conjunctiva/corneas clear, EOM's intact, both eyes  Ears:    Normal external ear canals, both ears  Nose:   Nares normal, septum midline, mucosa normal, no drainage or sinus tenderness  Throat:   Lips,  mucosa, and tongue normal; teeth and gums normal  Neck:   Supple, symmetrical, trachea midline, no adenopathy; thyroid: no enlargement/tenderness/nodules; no carotid bruit or JVD  Back:     Symmetric, no curvature, ROM normal, no CVA tenderness  Lungs:     Clear to auscultation bilaterally, respirations unlabored  Chest Wall:    No tenderness or deformity   Heart:    Regular rate and rhythm, S1 and S2 normal, no murmur, rub or gallop  Breast Exam:    No tenderness, masses, or nipple abnormality  Abdomen:     Soft, non-tender, bowel sounds active all four quadrants, no masses, no organomegaly.    Genitalia:    Pelvic:external genitalia normal,  vagina without lesions, discharge, or tenderness, rectovaginal septum  normal. Cervix normal in appearance, no cervical motion tenderness, no adnexal masses or tenderness. IUD threads visualized from external cervical os, ~ 3 cm in length.  Uterus normal size, shape, mobile, regular contours, nontender.  Rectal:    Normal external sphincter.  No hemorrhoids appreciated. Internal exam not done.   Extremities:   Extremities normal, atraumatic, no cyanosis or edema  Pulses:   2+ and symmetric all extremities  Skin:   Skin color, texture, turgor normal, no rashes or lesions  Lymph nodes:   Cervical, supraclavicular, and axillary nodes normal  Neurologic:   CNII-XII intact, normal strength, sensation and reflexes throughout   .  Labs:  Lab Results  Component Value Date   WBC 9.1 06/26/2017   HGB 13.7 06/26/2017   HCT 42.5 06/26/2017   MCV 91 06/26/2017   PLT 261 06/26/2017    Lab Results  Component Value Date   CREATININE 0.88 06/26/2017   BUN 13 06/26/2017   NA 141 06/26/2017   K 4.4 06/26/2017   CL 101 06/26/2017   CO2 25 06/26/2017    Lab Results  Component Value Date   ALT 11 06/26/2017   AST 19 06/26/2017   ALKPHOS 120 (H) 06/26/2017   BILITOT 0.3 06/26/2017     Assessment:   Healthy female exam.  Contraception surveillance  (OCPs)  Plan:    Labs: CBC and CMP ordered.  Breast self exam technique reviewed and patient encouraged to perform self-exam monthly. Contraception: OCPs. Refill given  Discussed healthy lifestyle modifications. Pap smear up to date.  Up to date on flu vaccine   Has had Gardasil series.  Follow up in 1 year for annual exam.    Rubie Maid, MD [facility]

## 2018-07-04 LAB — CBC
Hematocrit: 37.7 % (ref 34.0–46.6)
Hemoglobin: 12.8 g/dL (ref 11.1–15.9)
MCH: 29.3 pg (ref 26.6–33.0)
MCHC: 34 g/dL (ref 31.5–35.7)
MCV: 86 fL (ref 79–97)
PLATELETS: 227 10*3/uL (ref 150–450)
RBC: 4.37 x10E6/uL (ref 3.77–5.28)
RDW: 12 % (ref 11.7–15.4)
WBC: 6 10*3/uL (ref 3.4–10.8)

## 2018-07-04 LAB — COMPREHENSIVE METABOLIC PANEL
ALK PHOS: 90 IU/L (ref 39–117)
ALT: 7 IU/L (ref 0–32)
AST: 15 IU/L (ref 0–40)
Albumin/Globulin Ratio: 1.8 (ref 1.2–2.2)
Albumin: 4.1 g/dL (ref 3.9–5.0)
BUN/Creatinine Ratio: 17 (ref 9–23)
BUN: 11 mg/dL (ref 6–20)
Bilirubin Total: 0.4 mg/dL (ref 0.0–1.2)
CO2: 22 mmol/L (ref 20–29)
Calcium: 9.3 mg/dL (ref 8.7–10.2)
Chloride: 103 mmol/L (ref 96–106)
Creatinine, Ser: 0.65 mg/dL (ref 0.57–1.00)
GFR calc Af Amer: 142 mL/min/{1.73_m2} (ref >59)
GFR calc non Af Amer: 123 mL/min/{1.73_m2} (ref >59)
Globulin, Total: 2.3 g/dL (ref 1.5–4.5)
Glucose: 76 mg/dL (ref 65–99)
Potassium: 3.9 mmol/L (ref 3.5–5.2)
Sodium: 140 mmol/L (ref 134–144)
Total Protein: 6.4 g/dL (ref 6.0–8.5)

## 2018-07-05 ENCOUNTER — Encounter: Payer: Self-pay | Admitting: Obstetrics and Gynecology

## 2018-08-07 ENCOUNTER — Other Ambulatory Visit: Payer: Self-pay | Admitting: Physician Assistant

## 2018-08-07 DIAGNOSIS — F419 Anxiety disorder, unspecified: Secondary | ICD-10-CM

## 2018-08-08 MED ORDER — SERTRALINE HCL 50 MG PO TABS: 75.0000 mg | ORAL_TABLET | Freq: Every day | ORAL | 0 refills | Status: DC

## 2018-08-08 NOTE — Telephone Encounter (Signed)
Please Review

## 2018-09-01 ENCOUNTER — Other Ambulatory Visit: Payer: Self-pay

## 2018-09-01 DIAGNOSIS — F909 Attention-deficit hyperactivity disorder, unspecified type: Secondary | ICD-10-CM

## 2018-09-01 MED ORDER — AMPHETAMINE-DEXTROAMPHETAMINE 20 MG PO TABS: 20.0000 mg | ORAL_TABLET | Freq: Every day | ORAL | 0 refills | Status: DC

## 2018-09-01 NOTE — Telephone Encounter (Signed)
I will refill it for 30 days but I will need to see her in some form either in person or virtually to renew refills, last office visit ~ 1 year ago. I do have her records from [place] [facility] detailing diagnosis.

## 2018-09-01 NOTE — Telephone Encounter (Signed)
Patient called at 1:54pm when the computer system was down asking if Fabio Bering could refill her Adderall XR 20MG  (although the Adderal listed in patient chart is regular Adderall). She says this was being prescribed by a specialist, but their office is only doing virtual visits, and she is not able to do a virtual visit during the day time. 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 Patient also wants to know if Fabio Bering is willing to take over prescribing this medication for future refills. Please advise. Pharmacy ([facility] [address])

## 2018-09-02 NOTE — Telephone Encounter (Signed)
LMTCB

## 2018-09-05 NOTE — Telephone Encounter (Signed)
[email or web address] 

## 2018-09-07 ENCOUNTER — Other Ambulatory Visit: Payer: Self-pay | Admitting: Physician Assistant

## 2018-09-07 DIAGNOSIS — G43109 Migraine with aura, not intractable, without status migrainosus: Secondary | ICD-10-CM

## 2018-09-08 MED ORDER — PROPRANOLOL HCL 40 MG PO TABS: 40.0000 mg | ORAL_TABLET | Freq: Two times a day (BID) | ORAL | 0 refills | Status: DC

## 2018-09-08 NOTE — Telephone Encounter (Signed)
Please review

## 2018-09-10 NOTE — Telephone Encounter (Signed)
Patient was advised and appt scheduled in 1 week. KW

## 2018-09-18 ENCOUNTER — Encounter: Payer: Self-pay | Admitting: Physician Assistant

## 2018-09-18 ENCOUNTER — Other Ambulatory Visit: Payer: Self-pay

## 2018-09-18 ENCOUNTER — Ambulatory Visit: Payer: 59 | Admitting: Physician Assistant

## 2018-09-18 VITALS — BP 118/70 | HR 113 | Temp 98.6°F | Resp 16 | Wt 111.8 lb

## 2018-09-18 DIAGNOSIS — F909 Attention-deficit hyperactivity disorder, unspecified type: Secondary | ICD-10-CM | POA: Diagnosis not present

## 2018-09-18 DIAGNOSIS — G43809 Other migraine, not intractable, without status migrainosus: Secondary | ICD-10-CM

## 2018-09-18 DIAGNOSIS — F419 Anxiety disorder, unspecified: Secondary | ICD-10-CM

## 2018-09-18 DIAGNOSIS — F329 Major depressive disorder, single episode, unspecified: Secondary | ICD-10-CM

## 2018-09-18 DIAGNOSIS — F32A Depression, unspecified: Secondary | ICD-10-CM

## 2018-09-18 NOTE — Patient Instructions (Signed)
Attention Deficit Hyperactivity Disorder, Adult Attention deficit hyperactivity disorder (ADHD) is a mental health disorder that starts during childhood. For many people with ADHD, the disorder continues into adult years. There are many things that you and your health care provider or therapist (mental health professional) can do to manage your symptoms. What are the causes? The exact cause of ADHD is not known. What increases the risk? You are more likely to develop this condition if:  You have a family history of ADHD.  You are female.  You were born to a mother who smoked or drank alcohol during pregnancy.  You were exposed to lead poisoning or other toxins in the womb or in early life.  You were born before 37 weeks of pregnancy (prematurely) or you had a low birth weight.  You have experienced a brain injury. What are the signs or symptoms? Symptoms of this condition depend on the type of ADHD. The two main types are inattentive and hyperactive-impulsive. Some people may have symptoms of both types. Symptoms of the inattentive type include:  Difficulty watching, listening, or thinking with focused effort (paying attention).  Making careless mistakes.  Not listening.  Not following instructions.  Being disorganized.  Avoiding tasks that require time and attention.  Losing things.  Forgetting things.  Being easily distracted. Symptoms of the hyperactive-impulsive type include:  Restlessness.  Talking too much.  Interrupting.  Difficulty with: ? Sitting still. ? Staying quiet. ? Feeling motivated. ? Relaxing. ? Waiting in line or waiting for a turn. How is this diagnosed? This condition is diagnosed based on your current symptoms and your history of symptoms. The diagnosis can be made by a provider such as a primary care provider, psychiatrist, psychologist, or clinical social worker. The provider may use a symptom checklist or a standardized behavior rating  scale to evaluate your symptoms. He or she may want to talk with family members who have known you for a long time and have observed your behaviors. There are no lab tests or brain imaging tests that can diagnose ADHD. How is this treated? This condition can be treated with medicines and behavior therapy. Medicines may be the best option to reduce impulsive behaviors and improve attention. Your health care provider may recommend:  Stimulant medicines. These are the most common medicines used for adult ADHD. They affect certain chemicals in the brain (neurotransmitters). These medicines may be long-acting or short-acting. This will determine how often you need to take the medicine.  A non-stimulant medicine for adult ADHD (atomoxetine). This medicine increases a neurotransmitter called norepinephrine. It may take weeks to months to see effects from this medicine. Psychotherapy and behavioral management are also important for treating ADHD. Psychotherapy is often used along with medicine. Your health care provider may suggest:  Cognitive behavioral therapy (CBT). This type of therapy teaches you to replace negative thoughts and actions with positive thoughts and actions. When used as part of ADHD treatment, this therapy may also include: ? Coping strategies for organization, time management, impulse control, and stress reduction. ? Mindfulness and meditation training.  Behavioral management. This may include strategies for organization and time management. You may work with an ADHD coach who is specially trained to help people with ADHD to manage and organize activities and to function more effectively. Follow these instructions at home: Medicines   Take over-the-counter and prescription medicines only as told by your health care provider.  Talk with your health care provider about the possible side effects of your   medicine to watch for. General instructions   Learn as much as you can about  adult ADHD, and work closely with your health care providers to find the treatments that work best for you.  Do not use drugs or abuse alcohol. Limit alcohol intake to no more than 1 drink a day for nonpregnant women and 2 drinks a day for men. One drink equals 12 oz of beer, 5 oz of wine, or 1 oz of hard liquor.  Follow the same schedule each day. Make sure your schedule includes enough time for you to get plenty of sleep.  Use reminder devices like notes, calendars, and phone apps to stay on-time and organized.  Eat a healthy diet. Do not skip meals.  Exercise regularly. Exercise can help to reduce stress and anxiety.  Keep all follow-up visits as told by your health care provider and therapist. This is important. Where to find more information  A health care provider may be able to recommend resources that are available online or over the phone. You could start with: ? Attention Deficit Disorder Association (ADDA): www.add.org ? National Institute of Mental Health (NIMH): www.nimh.nih.gov Contact a health care provider if:  Your symptoms are changing, getting worse, or not improving.  You have side effects from your medicine, such as: ? Repeated muscle twitches, coughing, or speech outbursts. ? Sleep problems. ? Loss of appetite. ? Depression. ? New or worsening behavior problems. ? Dizziness. ? Unusually fast heartbeat. ? Stomach pains. ? Headaches.  You are struggling with anxiety, depression, or substance abuse. Get help right away if:  You have a severe reaction to a medicine.  You have thoughts of hurting yourself or others. If you ever feel like you may hurt yourself or others, or have thoughts about taking your own life, get help right away. You can go to the nearest emergency department or call:  Your local emergency services (911 in the [place]  A suicide crisis helpline, such as the National Suicide Prevention Lifeline at 1-800-273-8255. This is open 24 hours a  day. Summary  ADHD is a mental health disorder that starts during childhood and often continues into adult years.  The exact cause of ADHD is not known.  There is no cure for ADHD, but treatment with medicine, therapy, or behavioral training can help you manage your condition. This information is not intended to replace advice given to you by your health care provider. Make sure you discuss any questions you have with your health care provider. Document Released: 11/29/2016 Document Revised: 11/29/2016 Document Reviewed: 11/29/2016 Elsevier Interactive Patient Education  2019 Elsevier Inc.  

## 2018-09-18 NOTE — Progress Notes (Signed)
Patient: Eileen Davis Female    DOB: Apr 08, 1991   28 y.o.   MRN: [id number] Visit Date: 09/18/2018  Today's Provider: Trinna Post, PA-C   Chief Complaint  Patient presents with  . ADHD   Subjective:     HPI Adult ADD and ADHD Follow Up  Patient returns to office today to discuss medication management for ADHD. Patient reports that she was seeing psychiatry in January and has had good compliance and symptom control on Adderall XR 20mg , patient states that she only takes medication for work but states that she would like to discuss increasing dosage so she is on a daily regimen. Denies palpitations, insomnia, and weight loss.   She continues to do well with propranolol for palpitations/migraines, zoloft for depression and anxiety, and imitrex for migraines.  Pulse Readings from Last 3 Encounters:  09/18/18 (!) 113  07/03/18 87  01/10/18 78     No Known Allergies   Current Outpatient Medications:  .  amphetamine-dextroamphetamine (ADDERALL) 20 MG tablet, Take 1 tablet (20 mg total) by mouth daily for 30 days. Only take medication when she works, Disp: 30 tablet, Rfl: 0 .  dicyclomine (BENTYL) 20 MG tablet, Take 1 tablet (20 mg total) by mouth every 6 (six) hours as needed for spasms., Disp: 30 tablet, Rfl: 2 .  drospirenone-ethinyl estradiol (YASMIN 28) 3-0.03 MG tablet, Take 1 tablet by mouth daily., Disp: 3 Package, Rfl: 4 .  ibuprofen (ADVIL,MOTRIN) 200 MG tablet, Take 800 mg by mouth every 6 (six) hours as needed for mild pain., Disp: , Rfl:  .  Multiple Vitamins-Minerals (HAIR SKIN AND NAILS FORMULA) TABS, Take 1 tablet by mouth daily., Disp: , Rfl:  .  ondansetron (ZOFRAN) 4 MG tablet, Take 1 tablet (4 mg total) by mouth every 8 (eight) hours as needed for nausea or vomiting., Disp: 30 tablet, Rfl: 2 .  propranolol (INDERAL) 40 MG tablet, Take 1 tablet (40 mg total) by mouth 2 (two) times daily., Disp: 180 tablet, Rfl: 0 .  sertraline (ZOLOFT) 50 MG tablet,  Take 1.5 tablets (75 mg total) by mouth daily., Disp: 135 tablet, Rfl: 0 .  SUMAtriptan (IMITREX) 50 MG tablet, Use 25 mg at first sign of migraine. May repeat in 2 hours if headache persists or recurs., Disp: 10 tablet, Rfl: 0  Review of Systems  Constitutional: Negative.   HENT: Negative.   Eyes: Negative.  Negative for discharge.  Respiratory: Negative.   Cardiovascular: Negative.   Gastrointestinal: Negative.   Endocrine: Negative.   Neurological: Negative.   Hematological: Negative.   Psychiatric/Behavioral: Positive for dysphoric mood. Negative for behavioral problems, confusion, decreased concentration and hallucinations. The patient is hyperactive.     Social History   Tobacco Use  . Smoking status: Former Smoker    Packs/day: 0.00  . Smokeless tobacco: Never Used  Substance Use Topics  . Alcohol use: Yes    Comment: Rarely      Objective:   BP 118/70   Pulse (!) 113   Temp 98.6 F (37 C) (Oral)   Resp 16   Wt 111 lb 12.8 oz (50.7 kg)   BMI 20.45 kg/m  Vitals:   09/18/18 1030  BP: 118/70  Pulse: (!) 113  Resp: 16  Temp: 98.6 F (37 C)  TempSrc: Oral  Weight: 111 lb 12.8 oz (50.7 kg)     Physical Exam Constitutional:      Appearance: Normal appearance.  Cardiovascular:  Rate and Rhythm: Regular rhythm. Tachycardia present.     Heart sounds: Normal heart sounds.  Pulmonary:     Effort: Pulmonary effort is normal.     Breath sounds: Normal breath sounds.  Skin:    General: Skin is warm and dry.  Neurological:     Mental Status: She is alert and oriented to person, place, and time. Mental status is at baseline.         Assessment & Plan    1. Attention deficit hyperactivity disorder (ADHD), unspecified ADHD type  Controlled substance signed today. Will see her Q4 months for med check.   2. Migraine  Continue propranolol and imitrex.  3. Anxiety and depression  Continue zoloft.   The entirety of the information documented in the  History of Present Illness, Review of Systems and Physical Exam were personally obtained by me. Portions of this information were initially documented by Jennings Books, CMA and reviewed by me for thoroughness and accuracy.   F/u 4 months      Trinna Post, PA-C  [facility]

## 2018-11-10 ENCOUNTER — Encounter: Payer: Self-pay | Admitting: Physician Assistant

## 2018-11-10 ENCOUNTER — Telehealth: Payer: Self-pay | Admitting: Physician Assistant

## 2018-11-10 NOTE — Progress Notes (Signed)
Patient: Eileen Davis Female    DOB: [birth year]   28 y.o.   MRN: [id number] Visit Date: 11/11/2018  Today's Provider: Trinna Post, PA-C   Chief Complaint  Patient presents with  . Urinary Tract Infection   Subjective:     HPI  Patient states that having lower abdominal and lower back pain on the right side, slight odor for two days. Had UTI several weeks ago and was cultured at [facility] which showed E. Coli and she was treated with macrobid. Had GI episode a few days ago which she thinks may be related.  Previous E. Coli resistant to PCN, cephalosporins. Has taken septra for a while and reports it may not work anymore.  Virtual Visit via Telephone Note  I connected with Eileen Davis on 11/11/18 at  8:20 AM EDT by a telephone application and verified that I am speaking with the correct person using two identifiers.  Location: Patient: Home Provider: Home   I discussed the limitations of evaluation and management by telemedicine and the availability of in person appointments. The patient expressed understanding and agreed to proceed.     I discussed the assessment and treatment plan with the patient. The patient was provided an opportunity to ask questions and all were answered. The patient agreed with the plan and demonstrated an understanding of the instructions.   The patient was advised to call back or seek an in-person evaluation if the symptoms worsen or if the condition fails to improve as anticipated.   No Known Allergies   Current Outpatient Medications:  .  drospirenone-ethinyl estradiol (YASMIN 28) 3-0.03 MG tablet, Take 1 tablet by mouth daily., Disp: 3 Package, Rfl: 4 .  ibuprofen (ADVIL,MOTRIN) 200 MG tablet, Take 800 mg by mouth every 6 (six) hours as needed for mild pain., Disp: , Rfl:  .  Multiple Vitamins-Minerals (HAIR SKIN AND NAILS FORMULA) TABS, Take 1 tablet by mouth daily., Disp: , Rfl:  .  ondansetron (ZOFRAN) 4 MG tablet, Take 1  tablet (4 mg total) by mouth every 8 (eight) hours as needed for nausea or vomiting., Disp: 30 tablet, Rfl: 2 .  propranolol (INDERAL) 40 MG tablet, Take 1 tablet (40 mg total) by mouth 2 (two) times daily., Disp: 180 tablet, Rfl: 0 .  amphetamine-dextroamphetamine (ADDERALL) 20 MG tablet, Take 1 tablet (20 mg total) by mouth daily for 30 days. Only take medication when she works, Disp: 30 tablet, Rfl: 0 .  dicyclomine (BENTYL) 20 MG tablet, Take 1 tablet (20 mg total) by mouth every 6 (six) hours as needed for spasms. (Patient not taking: Reported on 11/10/2018), Disp: 30 tablet, Rfl: 2 .  sertraline (ZOLOFT) 50 MG tablet, Take 1.5 tablets (75 mg total) by mouth daily., Disp: 135 tablet, Rfl: 0 .  SUMAtriptan (IMITREX) 50 MG tablet, Use 25 mg at first sign of migraine. May repeat in 2 hours if headache persists or recurs. (Patient not taking: Reported on 11/10/2018), Disp: 10 tablet, Rfl: 0  Review of Systems  Genitourinary: Positive for pelvic pain.  Musculoskeletal: Positive for back pain.    Social History   Tobacco Use  . Smoking status: Former Smoker    Packs/day: 0.00  . Smokeless tobacco: Never Used  Substance Use Topics  . Alcohol use: Yes    Comment: Rarely      Objective:   Temp 98.6 F (37 C) (Oral)  Vitals:   11/10/18 1551  Temp: 98.6 F (37 C)  TempSrc: Oral     Physical Exam   No results found for any visits on 11/11/18.     Assessment & Plan    1. 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 We will defer urine testing for now due to exposure. If she comes back negative and persists with symptoms, she can drop off urine. We will treat with macrobid as below based on prior urine culture that patient has reported from [facility].  - nitrofurantoin, macrocrystal-monohydrate, (MACROBID) 100 MG capsule; Take 1 capsule (100 mg total) by mouth 2 (two) times daily for 7 days.  Dispense: 14 capsule; Refill: 0  2. Dysuria  -  CULTURE, URINE COMPREHENSIVE  3. Urinary frequency  - CULTURE, URINE COMPREHENSIVE  4. Attention deficit hyperactivity disorder (ADHD), unspecified ADHD type  Refill as below. Rivanna reviewed.  - amphetamine-dextroamphetamine (ADDERALL XR) 20 MG 24 hr capsule; Take 1 capsule (20 mg total) by mouth every morning.  Dispense: 30 capsule; Refill: 0  The entirety of the information documented in the History of Present Illness, Review of Systems and Physical Exam were personally obtained by me. Portions of this information were initially documented by Doran Clay, LPN and reviewed by me for thoroughness and accuracy.         Trinna Post, PA-C  [facility]

## 2018-11-10 NOTE — Telephone Encounter (Signed)
Pt has a DOXY.ME visit with Adriana on 07-21 at 8:20 pm. Please send the email for the visit.  The email address is correct.  Thanks, [employer]

## 2018-11-11 ENCOUNTER — Ambulatory Visit (INDEPENDENT_AMBULATORY_CARE_PROVIDER_SITE_OTHER): Payer: 59 | Admitting: Physician Assistant

## 2018-11-11 VITALS — Temp 98.6°F

## 2018-11-11 DIAGNOSIS — R300 Dysuria: Secondary | ICD-10-CM

## 2018-11-11 DIAGNOSIS — F909 Attention-deficit hyperactivity disorder, unspecified type: Secondary | ICD-10-CM | POA: Diagnosis not present

## 2018-11-11 DIAGNOSIS — N3090 Cystitis, unspecified without hematuria: Secondary | ICD-10-CM | POA: Diagnosis not present

## 2018-11-11 DIAGNOSIS — R350 Frequency of micturition: Secondary | ICD-10-CM

## 2018-11-11 MED ORDER — AMPHETAMINE-DEXTROAMPHET ER 20 MG PO CP24: 20.0000 mg | ORAL_CAPSULE | ORAL | 0 refills | Status: DC

## 2018-11-11 MED ORDER — NITROFURANTOIN MONOHYD MACRO 100 MG PO CAPS: 100.0000 mg | ORAL_CAPSULE | Freq: Two times a day (BID) | ORAL | 0 refills | Status: DC

## 2018-11-11 NOTE — Patient Instructions (Signed)

## 2018-11-11 NOTE — Telephone Encounter (Signed)
Visit is completed.

## 2018-11-14 ENCOUNTER — Other Ambulatory Visit: Payer: Self-pay | Admitting: Physician Assistant

## 2018-11-14 DIAGNOSIS — F419 Anxiety disorder, unspecified: Secondary | ICD-10-CM

## 2018-12-29 ENCOUNTER — Other Ambulatory Visit: Payer: Self-pay | Admitting: Physician Assistant

## 2018-12-29 DIAGNOSIS — G43109 Migraine with aura, not intractable, without status migrainosus: Secondary | ICD-10-CM

## 2019-01-09 ENCOUNTER — Encounter: Payer: Self-pay | Admitting: Physician Assistant

## 2019-01-12 ENCOUNTER — Encounter: Payer: Self-pay | Admitting: Physician Assistant

## 2019-01-12 DIAGNOSIS — F909 Attention-deficit hyperactivity disorder, unspecified type: Secondary | ICD-10-CM

## 2019-01-12 MED ORDER — AMPHETAMINE-DEXTROAMPHET ER 20 MG PO CP24: 20.0000 mg | ORAL_CAPSULE | ORAL | 0 refills | Status: DC

## 2019-01-27 ENCOUNTER — Encounter: Payer: Self-pay | Admitting: Physician Assistant

## 2019-01-27 ENCOUNTER — Telehealth: Payer: Self-pay | Admitting: Physician Assistant

## 2019-01-27 DIAGNOSIS — F909 Attention-deficit hyperactivity disorder, unspecified type: Secondary | ICD-10-CM

## 2019-01-27 MED ORDER — AMPHETAMINE-DEXTROAMPHET ER 20 MG PO CP24: 20.0000 mg | ORAL_CAPSULE | ORAL | 0 refills | Status: DC

## 2019-01-27 NOTE — Telephone Encounter (Signed)
Pt stated that the Rx for amphetamine-dextroamphetamine (ADDERALL XR) 20 MG 24 hr capsule was supposed to be sent to [facility] not [facility]. Pt is requesting Rx got to [facility]. Please advise. Thanks TNP

## 2019-03-26 ENCOUNTER — Encounter: Payer: Self-pay | Admitting: Physician Assistant

## 2019-03-26 DIAGNOSIS — F909 Attention-deficit hyperactivity disorder, unspecified type: Secondary | ICD-10-CM

## 2019-03-30 MED ORDER — AMPHETAMINE-DEXTROAMPHET ER 20 MG PO CP24: 20.0000 mg | ORAL_CAPSULE | ORAL | 0 refills | Status: DC

## 2019-04-08 ENCOUNTER — Encounter: Payer: Self-pay | Admitting: Physician Assistant

## 2019-04-08 DIAGNOSIS — F909 Attention-deficit hyperactivity disorder, unspecified type: Secondary | ICD-10-CM

## 2019-04-08 MED ORDER — AMPHETAMINE-DEXTROAMPHET ER 20 MG PO CP24: 20.0000 mg | ORAL_CAPSULE | ORAL | 0 refills | Status: DC

## 2019-04-19 ENCOUNTER — Other Ambulatory Visit: Payer: Self-pay | Admitting: Physician Assistant

## 2019-04-19 DIAGNOSIS — F419 Anxiety disorder, unspecified: Secondary | ICD-10-CM

## 2019-05-12 NOTE — Progress Notes (Signed)
Patient: Eileen Davis Female    DOB: 10-29-1990   28 y.o.   MRN: [id number] Visit Date: 05/13/2019  Today's Provider: Trinna Post, PA-C   Chief Complaint  Patient presents with  . Medication Refill   Subjective:    I, Porsha McClurkin,CMA am acting as a scribe for [employer].  HPI  ADHD Patient presents today for ADHD follow-up. Patient was last seen on 09/18/2018. Patient is currently taking Adderall 20 MG.   Anxiety & Depression Patient presents today for anxiety and depression follow-up. Patient was last seen on 09/18/2018. Patient was advised to continue Zoloft 50 MG.  Insomnia: not sleeping well. Difficulty falling asleep but not staying asleep. Works night shift with EMS. Has reduced caffeine intake and done aroma therapy as well as other relaxation techniques.   GAD 7 : Generalized Anxiety Score 05/13/2019  Nervous, Anxious, on Edge 0  Control/stop worrying 0  Worry too much - different things 0  Trouble relaxing 1  Restless 0  Easily annoyed or irritable 0  Afraid - awful might happen 0  Total GAD 7 Score 1  Anxiety Difficulty Somewhat difficult        No Known Allergies   Current Outpatient Medications:  .  amphetamine-dextroamphetamine (ADDERALL XR) 20 MG 24 hr capsule, Take 1 capsule (20 mg total) by mouth every morning., Disp: 30 capsule, Rfl: 0 .  drospirenone-ethinyl estradiol (YASMIN 28) 3-0.03 MG tablet, Take 1 tablet by mouth daily., Disp: 3 Package, Rfl: 4 .  ibuprofen (ADVIL,MOTRIN) 200 MG tablet, Take 800 mg by mouth every 6 (six) hours as needed for mild pain., Disp: , Rfl:  .  Multiple Vitamins-Minerals (HAIR SKIN AND NAILS FORMULA) TABS, Take 1 tablet by mouth daily., Disp: , Rfl:  .  propranolol (INDERAL) 40 MG tablet, TAKE 1 TABLET(40 MG) BY MOUTH TWICE DAILY, Disp: 180 tablet, Rfl: 0 .  sertraline (ZOLOFT) 50 MG tablet, TAKE 1 AND 1/2 TABLETS(75 MG) BY MOUTH DAILY, Disp: 135 tablet, Rfl: 0 .  dicyclomine (BENTYL) 20 MG  tablet, Take 1 tablet (20 mg total) by mouth every 6 (six) hours as needed for spasms. (Patient not taking: Reported on 11/10/2018), Disp: 30 tablet, Rfl: 2 .  ondansetron (ZOFRAN) 4 MG tablet, Take 1 tablet (4 mg total) by mouth every 8 (eight) hours as needed for nausea or vomiting. (Patient not taking: Reported on 05/13/2019), Disp: 30 tablet, Rfl: 2 .  SUMAtriptan (IMITREX) 50 MG tablet, Use 25 mg at first sign of migraine. May repeat in 2 hours if headache persists or recurs. (Patient not taking: Reported on 11/10/2018), Disp: 10 tablet, Rfl: 0  Review of Systems  Social History   Tobacco Use  . Smoking status: Former Smoker    Packs/day: 0.00  . Smokeless tobacco: Never Used  Substance Use Topics  . Alcohol use: Yes    Comment: Rarely      Objective:   BP 117/83 (BP Location: Left Arm, Patient Position: Sitting, Cuff Size: Normal)   Pulse 74   Temp (!) 96.9 F (36.1 C) (Temporal)   Wt 110 lb 3.2 oz (50 kg)   SpO2 98%   BMI 20.16 kg/m  Vitals:   05/13/19 1335  BP: 117/83  Pulse: 74  Temp: (!) 96.9 F (36.1 C)  TempSrc: Temporal  SpO2: 98%  Weight: 110 lb 3.2 oz (50 kg)  Body mass index is 20.16 kg/m.   Physical Exam Constitutional:      Appearance: Normal  appearance.  Cardiovascular:     Rate and Rhythm: Normal rate and regular rhythm.     Heart sounds: Normal heart sounds.  Pulmonary:     Effort: Pulmonary effort is normal.     Breath sounds: Normal breath sounds.  Skin:    General: Skin is warm and dry.  Neurological:     General: No focal deficit present.     Mental Status: She is alert and oriented to person, place, and time.  Psychiatric:        Mood and Affect: Mood normal.        Behavior: Behavior normal.      No results found for any visits on 05/13/19.     Assessment & Plan    Anxiety and depression  Attention deficit hyperactivity disorder (ADHD), unspecified ADHD type - Plan: amphetamine-dextroamphetamine (ADDERALL) 20 MG  tablet  Other migraine without status migrainosus, not intractable - Plan: rizatriptan (MAXALT) 10 MG tablet, DISCONTINUED: rizatriptan (MAXALT) 10 MG tablet  Encounter for surveillance of contraceptive pills - Plan: norethindrone (CAMILA) 0.35 MG tablet, DISCONTINUED: norethindrone (CAMILA) 0.35 MG tablet  Insomnia, unspecified type - Plan: traZODone (DESYREL) 50 MG tablet, DISCONTINUED: traZODone (DESYREL) 50 MG tablet  Migraine with aura and without status migrainosus, not intractable - Plan: propranolol (INDERAL) 40 MG tablet  Anxiety - Plan: sertraline (ZOLOFT) 50 MG tablet  The entirety of the information documented in the History of Present Illness, Review of Systems and Physical Exam were personally obtained by me. Portions of this information were initially documented by [facility] and reviewed by me for thoroughness and accuracy.        Trinna Post, PA-C  [facility]

## 2019-05-13 ENCOUNTER — Ambulatory Visit: Payer: 59 | Admitting: Physician Assistant

## 2019-05-13 ENCOUNTER — Other Ambulatory Visit: Payer: Self-pay

## 2019-05-13 ENCOUNTER — Encounter: Payer: Self-pay | Admitting: Physician Assistant

## 2019-05-13 VITALS — BP 117/83 | HR 74 | Temp 96.9°F | Wt 110.2 lb

## 2019-05-13 DIAGNOSIS — F419 Anxiety disorder, unspecified: Secondary | ICD-10-CM | POA: Diagnosis not present

## 2019-05-13 DIAGNOSIS — G43809 Other migraine, not intractable, without status migrainosus: Secondary | ICD-10-CM | POA: Diagnosis not present

## 2019-05-13 DIAGNOSIS — Z3041 Encounter for surveillance of contraceptive pills: Secondary | ICD-10-CM | POA: Diagnosis not present

## 2019-05-13 DIAGNOSIS — G43109 Migraine with aura, not intractable, without status migrainosus: Secondary | ICD-10-CM

## 2019-05-13 DIAGNOSIS — F32A Depression, unspecified: Secondary | ICD-10-CM

## 2019-05-13 DIAGNOSIS — F909 Attention-deficit hyperactivity disorder, unspecified type: Secondary | ICD-10-CM | POA: Diagnosis not present

## 2019-05-13 DIAGNOSIS — G4700 Insomnia, unspecified: Secondary | ICD-10-CM

## 2019-05-13 DIAGNOSIS — F329 Major depressive disorder, single episode, unspecified: Secondary | ICD-10-CM

## 2019-05-13 MED ORDER — NORETHINDRONE 0.35 MG PO TABS: 1.0000 | ORAL_TABLET | Freq: Every day | ORAL | 3 refills | Status: DC

## 2019-05-13 MED ORDER — PROPRANOLOL HCL 40 MG PO TABS: ORAL_TABLET | ORAL | 0 refills | Status: DC

## 2019-05-13 MED ORDER — TRAZODONE HCL 50 MG PO TABS: 25.0000 mg | ORAL_TABLET | Freq: Every evening | ORAL | 3 refills | Status: DC | PRN

## 2019-05-13 MED ORDER — SERTRALINE HCL 50 MG PO TABS: ORAL_TABLET | ORAL | 0 refills | Status: DC

## 2019-05-13 MED ORDER — RIZATRIPTAN BENZOATE 10 MG PO TABS: 10.0000 mg | ORAL_TABLET | ORAL | 0 refills | Status: DC | PRN

## 2019-05-13 MED ORDER — AMPHETAMINE-DEXTROAMPHETAMINE 20 MG PO TABS: ORAL_TABLET | ORAL | 0 refills | Status: DC

## 2019-05-13 NOTE — Patient Instructions (Signed)

## 2019-06-02 ENCOUNTER — Other Ambulatory Visit: Payer: Self-pay | Admitting: Physician Assistant

## 2019-06-02 DIAGNOSIS — F909 Attention-deficit hyperactivity disorder, unspecified type: Secondary | ICD-10-CM

## 2019-06-02 NOTE — Telephone Encounter (Signed)
Pt request refill   amphetamine-dextroamphetamine (ADDERALL XR) 20 MG 24 hr capsule   [facility] [id number] [place], [place] - [address] AT [address] Phone:  [phone]  Fax:  [phone]

## 2019-06-03 MED ORDER — AMPHETAMINE-DEXTROAMPHETAMINE 20 MG PO TABS: ORAL_TABLET | ORAL | 0 refills | Status: DC

## 2019-06-05 ENCOUNTER — Encounter: Payer: Self-pay | Admitting: Physician Assistant

## 2019-06-05 ENCOUNTER — Other Ambulatory Visit: Payer: Self-pay | Admitting: Physician Assistant

## 2019-06-05 DIAGNOSIS — F909 Attention-deficit hyperactivity disorder, unspecified type: Secondary | ICD-10-CM

## 2019-06-05 MED ORDER — AMPHETAMINE-DEXTROAMPHETAMINE 20 MG PO TABS: ORAL_TABLET | ORAL | 0 refills | Status: DC

## 2019-06-05 NOTE — Telephone Encounter (Signed)
Apologies, sent it to the wrong pharmacy on 06/03/2019. Resent Rx to [facility] in [place].

## 2019-06-05 NOTE — Addendum Note (Signed)
Addended by: Trinna Post on: 06/05/2019 04:32 PM   Modules accepted: Orders

## 2019-06-05 NOTE — Telephone Encounter (Signed)
Pt is checking on the status of adderall xr 20 mg to be sent to [facility] [address]

## 2019-06-08 MED ORDER — AMPHETAMINE-DEXTROAMPHET ER 20 MG PO CP24: 20.0000 mg | ORAL_CAPSULE | ORAL | 0 refills | Status: DC

## 2019-07-06 NOTE — Patient Instructions (Addendum)
Preventive Care 21-27 Years Old, Female Preventive care refers to visits with your health care provider and lifestyle choices that can promote health and wellness. This includes:  A yearly physical exam. This may also be called an annual well check.  Regular dental visits and eye exams.  Immunizations.  Screening for certain conditions.  Healthy lifestyle choices, such as eating a healthy diet, getting regular exercise, not using drugs or products that contain nicotine and tobacco, and limiting alcohol use. What can I expect for my preventive care visit? Physical exam Your health care provider will check your:  Height and weight. This may be used to calculate body mass index (BMI), which tells if you are at a healthy weight.  Heart rate and blood pressure.  Skin for abnormal spots. Counseling Your health care provider may ask you questions about your:  Alcohol, tobacco, and drug use.  Emotional well-being.  Home and relationship well-being.  Sexual activity.  Eating habits.  Work and work environment.  Method of birth control.  Menstrual cycle.  Pregnancy history. What immunizations do I need?  Influenza (flu) vaccine  This is recommended every year. Tetanus, diphtheria, and pertussis (Tdap) vaccine  You may need a Td booster every 10 years. Varicella (chickenpox) vaccine  You may need this if you have not been vaccinated. Human papillomavirus (HPV) vaccine  If recommended by your health care provider, you may need three doses over 6 months. Measles, mumps, and rubella (MMR) vaccine  You may need at least one dose of MMR. You may also need a second dose. Meningococcal conjugate (MenACWY) vaccine  One dose is recommended if you are age 19-21 years and a first-year college student living in a residence hall, or if you have one of several medical conditions. You may also need additional booster doses. Pneumococcal conjugate (PCV13) vaccine  You may need  this if you have certain conditions and were not previously vaccinated. Pneumococcal polysaccharide (PPSV23) vaccine  You may need one or two doses if you smoke cigarettes or if you have certain conditions. Hepatitis A vaccine  You may need this if you have certain conditions or if you travel or work in places where you may be exposed to hepatitis A. Hepatitis B vaccine  You may need this if you have certain conditions or if you travel or work in places where you may be exposed to hepatitis B. Haemophilus influenzae type b (Hib) vaccine  You may need this if you have certain conditions. You may receive vaccines as individual doses or as more than one vaccine together in one shot (combination vaccines). Talk with your health care provider about the risks and benefits of combination vaccines. What tests do I need?  Blood tests  Lipid and cholesterol levels. These may be checked every 5 years starting at age 20.  Hepatitis C test.  Hepatitis B test. Screening  Diabetes screening. This is done by checking your blood sugar (glucose) after you have not eaten for a while (fasting).  Sexually transmitted disease (STD) testing.  BRCA-related cancer screening. This may be done if you have a family history of breast, ovarian, tubal, or peritoneal cancers.  Pelvic exam and Pap test. This may be done every 3 years starting at age 21. Starting at age 30, this may be done every 5 years if you have a Pap test in combination with an HPV test. Talk with your health care provider about your test results, treatment options, and if necessary, the need for more tests.   Follow these instructions at home: Eating and drinking   Eat a diet that includes fresh fruits and vegetables, whole grains, lean protein, and low-fat dairy.  Take vitamin and mineral supplements as recommended by your health care provider.  Do not drink alcohol if: ? Your health care provider tells you not to drink. ? You are  pregnant, may be pregnant, or are planning to become pregnant.  If you drink alcohol: ? Limit how much you have to 0-1 drink a day. ? Be aware of how much alcohol is in your drink. In the [place] one drink equals one 12 oz bottle of beer (355 mL), one 5 oz glass of wine (148 mL), or one 1 oz glass of hard liquor (44 mL). Lifestyle  Take daily care of your teeth and gums.  Stay active. Exercise for at least 30 minutes on 5 or more days each week.  Do not use any products that contain nicotine or tobacco, such as cigarettes, e-cigarettes, and chewing tobacco. If you need help quitting, ask your health care provider.  If you are sexually active, practice safe sex. Use a condom or other form of birth control (contraception) in order to prevent pregnancy and STIs (sexually transmitted infections). If you plan to become pregnant, see your health care provider for a preconception visit. What's next?  Visit your health care provider once a year for a well check visit.  Ask your health care provider how often you should have your eyes and teeth checked.  Stay up to date on all vaccines. This information is not intended to replace advice given to you by your health care provider. Make sure you discuss any questions you have with your health care provider. Document Revised: 12/19/2017 Document Reviewed: 12/19/2017 Elsevier Patient Education  2020 Elsevier Inc. Breast Self-Awareness Breast self-awareness is knowing how your breasts look and feel. Doing breast self-awareness is important. It allows you to catch a breast problem early while it is still small and can be treated. All women should do breast self-awareness, including women who have had breast implants. Tell your doctor if you notice a change in your breasts. What you need:  A mirror.  A well-lit room. How to do a breast self-exam A breast self-exam is one way to learn what is normal for your breasts and to check for changes. To do a  breast self-exam: Look for changes  1. Take off all the clothes above your waist. 2. Stand in front of a mirror in a room with good lighting. 3. Put your hands on your hips. 4. Push your hands down. 5. Look at your breasts and nipples in the mirror to see if one breast or nipple looks different from the other. Check to see if: ? The shape of one breast is different. ? The size of one breast is different. ? There are wrinkles, dips, and bumps in one breast and not the other. 6. Look at each breast for changes in the skin, such as: ? Redness. ? Scaly areas. 7. Look for changes in your nipples, such as: ? Liquid around the nipples. ? Bleeding. ? Dimpling. ? Redness. ? A change in where the nipples are. Feel for changes  1. Lie on your back on the floor. 2. Feel each breast. To do this, follow these steps: ? Pick a breast to feel. ? Put the arm closest to that breast above your head. ? Use your other arm to feel the nipple area of your breast. Feel   breast. Feel the area with the pads of your three middle fingers by making small circles with your fingers. For the first circle, press lightly. For the second circle, press harder. For the third circle, press even harder. ? Keep making circles with your fingers at the different pressures as you move down your breast. Stop when you feel your ribs. ? Move your fingers a little toward the center of your body. ? Start making circles with your fingers again, this time going up until you reach your collarbone. ? Keep making up-and-down circles until you reach your armpit. Remember to keep using the three pressures. ? Feel the other breast in the same way. 3. Sit or stand in the tub or shower. 4. With soapy water on your skin, feel each breast the same way you did in step 2 when you were lying on the floor. Write down what you find Writing down what you find can help you remember what to tell your doctor. Write down:  What is normal for each breast.  Any  changes you find in each breast, including: ? The kind of changes you find. ? Whether you have pain. ? Size and location of any lumps.  When you last had your menstrual period. General tips  Check your breasts every month.  If you are breastfeeding, the best time to check your breasts is after you feed your baby or after you use a breast pump.  If you get menstrual periods, the best time to check your breasts is 5-7 days after your menstrual period is over.  With time, you will become comfortable with the self-exam, and you will begin to know if there are changes in your breasts. Contact a doctor if you:  See a change in the shape or size of your breasts or nipples.  See a change in the skin of your breast or nipples, such as red or scaly skin.  Have fluid coming from your nipples that is not normal.  Find a lump or thick area that was not there before.  Have pain in your breasts.  Have any concerns about your breast health. Summary  Breast self-awareness includes looking for changes in your breasts, as well as feeling for changes within your breasts.  Breast self-awareness should be done in front of a mirror in a well-lit room.  You should check your breasts every month. If you get menstrual periods, the best time to check your breasts is 5-7 days after your menstrual period is over.  Let your doctor know of any changes you see in your breasts, including changes in size, changes on the skin, pain or tenderness, or fluid from your nipples that is not normal. This information is not intended to replace advice given to you by your health care provider. Make sure you discuss any questions you have with your health care provider. Document Revised: 11/26/2017 Document Reviewed: 11/26/2017 Elsevier Patient Education  [place].

## 2019-07-06 NOTE — Progress Notes (Signed)
Pt present for annual exam. Pt stated that she was doing well.

## 2019-07-07 ENCOUNTER — Encounter: Payer: Self-pay | Admitting: Obstetrics and Gynecology

## 2019-07-07 ENCOUNTER — Ambulatory Visit (INDEPENDENT_AMBULATORY_CARE_PROVIDER_SITE_OTHER): Payer: 59 | Admitting: Obstetrics and Gynecology

## 2019-07-07 ENCOUNTER — Other Ambulatory Visit: Payer: Self-pay

## 2019-07-07 VITALS — BP 104/72 | HR 87 | Ht 62.0 in | Wt 109.4 lb

## 2019-07-07 DIAGNOSIS — Z3041 Encounter for surveillance of contraceptive pills: Secondary | ICD-10-CM | POA: Diagnosis not present

## 2019-07-07 DIAGNOSIS — Z01419 Encounter for gynecological examination (general) (routine) without abnormal findings: Secondary | ICD-10-CM | POA: Diagnosis not present

## 2019-07-07 NOTE — Progress Notes (Signed)
GYNECOLOGY ANNUAL PHYSICAL EXAM PROGRESS NOTE  Subjective:    Eileen Davis is a 29 y.o. G0P0 female who presents for an annual exam. The patient has no complaints today. The patient is sexually active, 1 partner, female.  The patient wears seatbelts: yes. The patient participates in regular exercise: yes. Has the patient ever been transfused or tattooed?: no.    Gynecologic History Menarche age: 72 No LMP recorded (lmp unknown). (Menstrual status: Oral contraceptives). Contraception: progesterone-only OCPs.  History of STI's: Denies  Last Pap:  06/26/2017. Results were: normal.  Remote h/o abnormal pap smear s/p LEEP.   OB History  Gravida Para Term Preterm AB Living  0 0 0 0 0 0  SAB TAB Ectopic Multiple Live Births  0 0 0 0 0    Past Medical History:  Diagnosis Date  . ADHD   . Anxiety and depression   . Depression   . Migraine   . Recurrent UTI   . Ureteral reflux 2005   Right side    Past Surgical History:  Procedure Laterality Date  . CERVICAL BIOPSY  W/ LOOP ELECTRODE EXCISION    . WISDOM TOOTH EXTRACTION  06/2016    Family History  Problem Relation Age of Onset  . Osteoporosis Mother   . Diabetes Father   . Hypertension Father   . Healthy Brother   . ADD / ADHD Brother   . Ovarian cancer Maternal Grandmother   . Prostate cancer Maternal Grandfather   . Congestive Heart Failure Paternal Grandmother   . CVA Paternal Grandmother   . Lung cancer Paternal Grandfather     Social History   Socioeconomic History  . Marital status: Single    Spouse name: Not on file  . Number of children: Not on file  . Years of education: Not on file  . Highest education level: Not on file  Occupational History  . Not on file  Tobacco Use  . Smoking status: Former Smoker    Packs/day: 0.00  . Smokeless tobacco: Never Used  Substance and Sexual Activity  . Alcohol use: Yes    Comment: Rarely  . Drug use: No  . Sexual activity: Yes    Birth control/protection:  Condom, Pill  Other Topics Concern  . Not on file  Social History Narrative  . Not on file   Social Determinants of Health   Financial Resource Strain:   . Difficulty of Paying Living Expenses:   Food Insecurity:   . Worried About Charity fundraiser in the Last Year:   . Arboriculturist in the Last Year:   Transportation Needs:   . Film/video editor (Medical):   Marland Kitchen Lack of Transportation (Non-Medical):   Physical Activity:   . Days of Exercise per Week:   . Minutes of Exercise per Session:   Stress:   . Feeling of Stress :   Social Connections:   . Frequency of Communication with Friends and Family:   . Frequency of Social Gatherings with Friends and Family:   . Attends Religious Services:   . Active Member of Clubs or Organizations:   . Attends Archivist Meetings:   Marland Kitchen Marital Status:   Intimate Partner Violence:   . Fear of Current or Ex-Partner:   . Emotionally Abused:   Marland Kitchen Physically Abused:   . Sexually Abused:     Current Outpatient Medications on File Prior to Visit  Medication Sig Dispense Refill  . amphetamine-dextroamphetamine (ADDERALL  XR) 20 MG 24 hr capsule Take 1 capsule (20 mg total) by mouth every morning. 30 capsule 0  . amphetamine-dextroamphetamine (ADDERALL) 20 MG tablet Take as needed up to twice daily on days that you do not take the Adderall XR 60 tablet 0  . norethindrone (CAMILA) 0.35 MG tablet Take 1 tablet (0.35 mg total) by mouth daily. 3 Package 3  . propranolol (INDERAL) 40 MG tablet TAKE 1 TABLET(40 MG) BY MOUTH TWICE DAILY 180 tablet 0  . rizatriptan (MAXALT) 10 MG tablet Take 1 tablet (10 mg total) by mouth as needed for migraine. May repeat in 2 hours if needed 10 tablet 0  . sertraline (ZOLOFT) 50 MG tablet TAKE 1 AND 1/2 TABLETS(75 MG) BY MOUTH DAILY 135 tablet 0  . traZODone (DESYREL) 50 MG tablet Take 0.5-1 tablets (25-50 mg total) by mouth at bedtime as needed for sleep. 30 tablet 3   No current facility-administered  medications on file prior to visit.    No Known Allergies   Review of Systems Constitutional: negative for chills, fatigue, fevers and sweats Eyes: negative for irritation, redness and visual disturbance Ears, nose, mouth, throat, and face: negative for hearing loss, nasal congestion, snoring and tinnitus Respiratory: negative for asthma, cough, sputum Cardiovascular: negative for chest pain, dyspnea, exertional chest pressure/discomfort, irregular heart beat, palpitations and syncope Gastrointestinal: negative for abdominal pain, change in bowel habits, nausea and vomiting Genitourinary:  Negative for abnormal menstrual periods, genital lesions, sexual problems and vaginal discharge, dysuria and urinary incontinence Integument/breast: negative for breast lump, breast tenderness and nipple discharge Hematologic/lymphatic: negative for bleeding and easy bruising Musculoskeletal:negative for back pain and muscle weakness Neurological:  Negative for headaches, dizziness, vertigo and weakness Endocrine: negative for diabetic symptoms including polydipsia, polyuria and skin dryness Allergic/Immunologic: negative for hay fever and urticaria      Objective:  Blood pressure 104/72, pulse 87, height 5\' 2"  (1.575 m), weight 109 lb 6.4 oz (49.6 kg). Body mass index is 20.01 kg/m.   General Appearance:    Alert, cooperative, no distress, appears stated age  Head:    Normocephalic, without obvious abnormality, atraumatic  Eyes:    PERRL, conjunctiva/corneas clear, EOM's intact, both eyes  Ears:    Normal external ear canals, both ears  Nose:   Nares normal, septum midline, mucosa normal, no drainage or sinus tenderness  Throat:   Lips, mucosa, and tongue normal; teeth and gums normal  Neck:   Supple, symmetrical, trachea midline, no adenopathy; thyroid: no enlargement/tenderness/nodules; no carotid bruit or JVD  Back:     Symmetric, no curvature, ROM normal, no CVA tenderness  Lungs:     Clear  to auscultation bilaterally, respirations unlabored  Chest Wall:    No tenderness or deformity   Heart:    Regular rate and rhythm, S1 and S2 normal, no murmur, rub or gallop  Breast Exam:    No tenderness, masses, or nipple abnormality  Abdomen:     Soft, non-tender, bowel sounds active all four quadrants, no masses, no organomegaly.    Genitalia:    Pelvic:external genitalia normal, vagina without lesions, discharge, or tenderness, rectovaginal septum  normal. Cervix normal in appearance, no cervical motion tenderness, no adnexal masses or tenderness. IUD threads visualized from external cervical os, ~ 3 cm in length.  Uterus normal size, shape, mobile, regular contours, nontender.  Rectal:    Normal external sphincter.  No hemorrhoids appreciated. Internal exam not done.   Extremities:   Extremities normal, atraumatic, no cyanosis  or edema  Pulses:   2+ and symmetric all extremities  Skin:   Skin color, texture, turgor normal, no rashes or lesions  Lymph nodes:   Cervical, supraclavicular, and axillary nodes normal  Neurologic:   CNII-XII intact, normal strength, sensation and reflexes throughout   .  Labs:  Lab Results  Component Value Date   WBC 6.0 07/03/2018   HGB 12.8 07/03/2018   HCT 37.7 07/03/2018   MCV 86 07/03/2018   PLT 227 07/03/2018    Lab Results  Component Value Date   CREATININE 0.65 07/03/2018   BUN 11 07/03/2018   NA 140 07/03/2018   K 3.9 07/03/2018   CL 103 07/03/2018   CO2 22 07/03/2018    Lab Results  Component Value Date   ALT 7 07/03/2018   AST 15 07/03/2018   ALKPHOS 90 07/03/2018   BILITOT 0.4 07/03/2018     Assessment:   Healthy female exam.  Contraception surveillance (OCPs)  Plan:    Labs: CBC and CMP ordered.  Breast self exam technique reviewed and patient encouraged to perform self-exam monthly. Contraception: progesterone-only OCPs. Doing well.   Discussed healthy lifestyle modifications. Pap smear up to date. Due in 1 year.   Up to date on flu vaccine   Has had Gardasil series.  Follow up in 1 year for annual exam.    Rubie Maid, MD [facility]

## 2019-07-08 LAB — CBC
Hematocrit: 45 % (ref 34.0–46.6)
Hemoglobin: 15.3 g/dL (ref 11.1–15.9)
MCH: 30.4 pg (ref 26.6–33.0)
MCHC: 34 g/dL (ref 31.5–35.7)
MCV: 89 fL (ref 79–97)
Platelets: 224 10*3/uL (ref 150–450)
RBC: 5.04 x10E6/uL (ref 3.77–5.28)
RDW: 12.5 % (ref 11.7–15.4)
WBC: 8.5 10*3/uL (ref 3.4–10.8)

## 2019-07-08 LAB — COMPREHENSIVE METABOLIC PANEL
ALT: 18 IU/L (ref 0–32)
AST: 22 IU/L (ref 0–40)
Albumin/Globulin Ratio: 1.8 (ref 1.2–2.2)
Albumin: 4.6 g/dL (ref 3.9–5.0)
Alkaline Phosphatase: 123 IU/L — ABNORMAL HIGH (ref 39–117)
BUN/Creatinine Ratio: 8 — ABNORMAL LOW (ref 9–23)
BUN: 6 mg/dL (ref 6–20)
Bilirubin Total: 0.7 mg/dL (ref 0.0–1.2)
CO2: 24 mmol/L (ref 20–29)
Calcium: 9.5 mg/dL (ref 8.7–10.2)
Chloride: 102 mmol/L (ref 96–106)
Creatinine, Ser: 0.72 mg/dL (ref 0.57–1.00)
GFR calc Af Amer: 133 mL/min/{1.73_m2} (ref >59)
GFR calc non Af Amer: 115 mL/min/{1.73_m2} (ref >59)
Globulin, Total: 2.5 g/dL (ref 1.5–4.5)
Glucose: 74 mg/dL (ref 65–99)
Potassium: 4.4 mmol/L (ref 3.5–5.2)
Sodium: 140 mmol/L (ref 134–144)
Total Protein: 7.1 g/dL (ref 6.0–8.5)

## 2019-07-31 ENCOUNTER — Other Ambulatory Visit: Payer: Self-pay | Admitting: Physician Assistant

## 2019-07-31 DIAGNOSIS — F909 Attention-deficit hyperactivity disorder, unspecified type: Secondary | ICD-10-CM

## 2019-07-31 MED ORDER — AMPHETAMINE-DEXTROAMPHET ER 20 MG PO CP24: 20.0000 mg | ORAL_CAPSULE | ORAL | 0 refills | Status: DC

## 2019-08-02 ENCOUNTER — Encounter (INDEPENDENT_AMBULATORY_CARE_PROVIDER_SITE_OTHER): Payer: 59 | Admitting: Physician Assistant

## 2019-08-02 DIAGNOSIS — F329 Major depressive disorder, single episode, unspecified: Secondary | ICD-10-CM

## 2019-08-02 DIAGNOSIS — F419 Anxiety disorder, unspecified: Secondary | ICD-10-CM

## 2019-08-02 DIAGNOSIS — F32A Depression, unspecified: Secondary | ICD-10-CM

## 2019-08-03 NOTE — Telephone Encounter (Signed)
Fabio Bering,  This was accidentally sent to me.  JB

## 2019-08-05 ENCOUNTER — Encounter (HOSPITAL_COMMUNITY): Payer: Self-pay

## 2019-08-05 ENCOUNTER — Emergency Department (HOSPITAL_COMMUNITY): Payer: 59

## 2019-08-05 ENCOUNTER — Other Ambulatory Visit: Payer: Self-pay

## 2019-08-05 ENCOUNTER — Emergency Department (HOSPITAL_COMMUNITY)
Admission: EM | Admit: 2019-08-05 | Discharge: 2019-08-06 | Disposition: A | Discharge status: Home / Self Care | Payer: 59 | Attending: Emergency Medicine | Admitting: Emergency Medicine

## 2019-08-05 ENCOUNTER — Ambulatory Visit (HOSPITAL_COMMUNITY)
Admission: EM | Admit: 2019-08-05 | Discharge: 2019-08-05 | Disposition: A | Discharge status: Home / Self Care | Payer: 59 | Source: Home / Self Care

## 2019-08-05 DIAGNOSIS — Y939 Activity, unspecified: Secondary | ICD-10-CM | POA: Insufficient documentation

## 2019-08-05 DIAGNOSIS — Y92010 Kitchen of single-family (private) house as the place of occurrence of the external cause: Secondary | ICD-10-CM | POA: Diagnosis not present

## 2019-08-05 DIAGNOSIS — S0993XA Unspecified injury of face, initial encounter: Secondary | ICD-10-CM

## 2019-08-05 DIAGNOSIS — R6884 Jaw pain: Secondary | ICD-10-CM

## 2019-08-05 DIAGNOSIS — Y999 Unspecified external cause status: Secondary | ICD-10-CM | POA: Insufficient documentation

## 2019-08-05 DIAGNOSIS — W01198A Fall on same level from slipping, tripping and stumbling with subsequent striking against other object, initial encounter: Secondary | ICD-10-CM | POA: Diagnosis not present

## 2019-08-05 DIAGNOSIS — Z79899 Other long term (current) drug therapy: Secondary | ICD-10-CM | POA: Insufficient documentation

## 2019-08-05 MED ORDER — SERTRALINE HCL 100 MG PO TABS: 100.0000 mg | ORAL_TABLET | Freq: Every day | ORAL | 1 refills | Status: DC

## 2019-08-05 NOTE — ED Notes (Signed)
Patient is being discharged from the Urgent [facility] and sent to the Emergency Department via wheelchair by staff. Per SM, NP, patient is stable but in need of higher level of care due to jaw pain / fall. Patient is aware and verbalizes understanding of plan of care. There were no vitals filed for this visit.   NP saw PT in triage and instructed her to go to ED

## 2019-08-05 NOTE — Telephone Encounter (Signed)
    Virtual telephone visit     Virtual Visit via Telephone Note   This visit type was conducted due to national recommendations for restrictions regarding the COVID-19 Pandemic (e.g. social distancing) in an effort to limit this patient's exposure and mitigate transmission in our community. Due to her co-morbid illnesses, this patient is at least at moderate risk for complications without adequate follow up. This format is felt to be most appropriate for this patient at this time. The patient did not have access to video technology or had technical difficulties with video requiring transitioning to audio format only (telephone). Physical exam was limited to content and character of the telephone converstion.    Patient location: Home Provider location: Office   Patient: Eileen Davis   DOB: 08-09-1990   29 y.o. Female  MRN: [id number] Visit Date: 08/05/2019  Today's Provider: Trinna Post, PA-C   CC: Depression and anxiety.  Subjective:  HPI   Patient with history of ADHD, depression and anxiety is following up today for worsening of depression. She denies SI/HI. She is currently on 75 mg zoloft QD and has been on this medication for at least several years. Historically, she has needed to increase the dosage to 100 mg daily.     Medications: Outpatient Medications Prior to Visit  Medication Sig  . amphetamine-dextroamphetamine (ADDERALL XR) 20 MG 24 hr capsule Take 1 capsule (20 mg total) by mouth every morning.  Marland Kitchen amphetamine-dextroamphetamine (ADDERALL) 20 MG tablet Take as needed up to twice daily on days that you do not take the Adderall XR  . norethindrone (CAMILA) 0.35 MG tablet Take 1 tablet (0.35 mg total) by mouth daily.  . propranolol (INDERAL) 40 MG tablet TAKE 1 TABLET(40 MG) BY MOUTH TWICE DAILY  . rizatriptan (MAXALT) 10 MG tablet Take 1 tablet (10 mg total) by mouth as needed for migraine. May repeat in 2 hours if needed  . traZODone (DESYREL) 50 MG tablet Take  0.5-1 tablets (25-50 mg total) by mouth at bedtime as needed for sleep.  . [DISCONTINUED] sertraline (ZOLOFT) 50 MG tablet TAKE 1 AND 1/2 TABLETS(75 MG) BY MOUTH DAILY   No facility-administered medications prior to visit.    ROS  12 point ROS reviewed and negative except HPI Objective:       Assessment & Plan:  1. Anxiety and depression  Will increase zoloft to 100 mg daily.   - sertraline (ZOLOFT) 100 MG tablet; Take 1 tablet (100 mg total) by mouth daily.  Dispense: 90 tablet; Refill: 1  F/u in 3 months or sooner for designated ADHD visit.   I discussed the assessment and treatment plan with the patient. The patient was provided an opportunity to ask questions and all were answered. The patient agreed with the plan and demonstrated an understanding of the instructions.   The patient was advised to call back or seek an in-person evaluation if the symptoms worsen or if the condition fails to improve as anticipated.    Trinna Post, PA-C  [place]

## 2019-08-05 NOTE — ED Triage Notes (Signed)
Pt states that she tripped over her cat and hit her jaw on her freezer, swelling noted, no LOC.

## 2019-08-06 MED ORDER — IBUPROFEN 600 MG PO TABS: 600.0000 mg | ORAL_TABLET | Freq: Four times a day (QID) | ORAL | 0 refills | Status: DC | PRN

## 2019-08-06 MED ORDER — METHOCARBAMOL 500 MG PO TABS: 500.0000 mg | ORAL_TABLET | Freq: Two times a day (BID) | ORAL | 0 refills | Status: DC

## 2019-08-06 MED ORDER — ACETAMINOPHEN 500 MG PO TABS: 500.0000 mg | ORAL_TABLET | Freq: Four times a day (QID) | ORAL | 0 refills | Status: DC | PRN

## 2019-08-06 NOTE — ED Provider Notes (Signed)
[facility] EMERGENCY DEPARTMENT Provider Note   CSN: [id number] Arrival date & time: 08/05/19  2007     History Chief Complaint  Patient presents with  . Jaw Pain    Eileen Davis is a 29 y.o. female with history of ADHD, depression, anxiety, migraine headaches presenting for evaluation of acute onset, persistent left mandibular pain secondary to injury that occurred around 6 or 6:30 PM on Wednesday.  She reports that she was in her kitchen when she tripped over her cat causing her to fall forward and strike the left side of her jaw on the handle of the freezer.  Denies loss of consciousness.  Since the injury she has had constant throbbing aching pain mostly along the left mandible but radiating up to the left ear and now to the left temple.  She reports she feels as though she is about to have a migraine for which she takes propanolol daily and has an abortive medicine but has not needed to take it so far.  She denies vision changes, difficulty swallowing or breathing, numbness or weakness of the extremities, neck pains.  She does tell me that she felt some tingling to the left side of the jaw as well but this has improved.  The pain worsens with talking and certain movements.  She has not tried anything for it.  She went to to urgent cares but was told she needed to come to the emergency department for imaging.  The history is provided by the patient.       Past Medical History:  Diagnosis Date  . ADHD   . Anxiety and depression   . Depression   . Migraine   . Recurrent UTI   . Ureteral reflux 2005   Right side    Patient Active Problem List   Diagnosis Date Noted  . Anxiety and depression 09/18/2018  . ADHD 05/02/2018  . Migraine 09/26/2017  . History of ovarian cyst 04/25/2016  . Recurrent UTI 06/02/2015  . Urinary tract infection without hematuria 03/01/2015  . Vesicoureteral-reflux, unspecified 03/01/2015    Past Surgical History:  Procedure  Laterality Date  . CERVICAL BIOPSY  W/ LOOP ELECTRODE EXCISION    . WISDOM TOOTH EXTRACTION  06/2016     OB History    Gravida  0   Para  0   Term  0   Preterm  0   AB  0   Living  0     SAB  0   TAB  0   Ectopic  0   Multiple  0   Live Births              Family History  Problem Relation Age of Onset  . Osteoporosis Mother   . Diabetes Father   . Hypertension Father   . Healthy Brother   . ADD / ADHD Brother   . Ovarian cancer Maternal Grandmother   . Prostate cancer Maternal Grandfather   . Congestive Heart Failure Paternal Grandmother   . CVA Paternal Grandmother   . Lung cancer Paternal Grandfather     Social History   Tobacco Use  . Smoking status: Former Smoker    Packs/day: 0.00  . Smokeless tobacco: Never Used  Substance Use Topics  . Alcohol use: Yes    Comment: Rarely  . Drug use: No    Home Medications Prior to Admission medications   Medication Sig Start Date End Date Taking? Authorizing Provider  acetaminophen (TYLENOL)  500 MG tablet Take 1 tablet (500 mg total) by mouth every 6 (six) hours as needed. 08/06/19   Manreet Kiernan A, PA-C  amphetamine-dextroamphetamine (ADDERALL XR) 20 MG 24 hr capsule Take 1 capsule (20 mg total) by mouth every morning. 07/31/19   Trinna Post, PA-C  amphetamine-dextroamphetamine (ADDERALL) 20 MG tablet Take as needed up to twice daily on days that you do not take the Adderall XR 06/05/19   Trinna Post, PA-C  ibuprofen (ADVIL) 600 MG tablet Take 1 tablet (600 mg total) by mouth every 6 (six) hours as needed. 08/06/19   Navy Belay A, PA-C  methocarbamol (ROBAXIN) 500 MG tablet Take 1 tablet (500 mg total) by mouth 2 (two) times daily. 08/06/19   Doninique Lwin A, PA-C  norethindrone (CAMILA) 0.35 MG tablet Take 1 tablet (0.35 mg total) by mouth daily. 05/13/19   Trinna Post, PA-C  propranolol (INDERAL) 40 MG tablet TAKE 1 TABLET(40 MG) BY MOUTH TWICE DAILY 05/13/19   Trinna Post, PA-C    rizatriptan (MAXALT) 10 MG tablet Take 1 tablet (10 mg total) by mouth as needed for migraine. May repeat in 2 hours if needed 05/13/19   Trinna Post, PA-C  sertraline (ZOLOFT) 100 MG tablet Take 1 tablet (100 mg total) by mouth daily. 08/05/19   Trinna Post, PA-C  traZODone (DESYREL) 50 MG tablet Take 0.5-1 tablets (25-50 mg total) by mouth at bedtime as needed for sleep. 05/13/19   Trinna Post, PA-C    Allergies    Patient has no known allergies.  Review of Systems   Review of Systems  HENT: Negative for drooling and trouble swallowing.        + jaw pain  Eyes: Negative for photophobia and visual disturbance.  Respiratory: Negative for shortness of breath.   Cardiovascular: Negative for chest pain.  Gastrointestinal: Negative for nausea and vomiting.  Neurological: Positive for numbness. Negative for syncope, weakness and light-headedness.  All other systems reviewed and are negative.   Physical Exam Updated Vital Signs BP 136/90   Pulse 62   Temp 97.9 F (36.6 C) (Oral)   Resp 14   LMP  (LMP Unknown)   SpO2 100%   Physical Exam Vitals and nursing note reviewed.  Constitutional:      General: She is not in acute distress.    Appearance: She is well-developed.  HENT:     Head: Normocephalic.     Comments: No Battle's signs, no raccoon's eyes, no rhinorrhea. No hemotympanum. No deformity, crepitus, or swelling noted.     Mouth/Throat:     Mouth: Mucous membranes are moist.     Comments: No trismus or jaw malocclusion.  Dentition is stable.  There is tenderness to palpation of the left side of the jaw along the angle and ramus.  Mild left zygomatic tenderness as well with no significant swelling, crepitus, or ecchymosis. Eyes:     General:        Right eye: No discharge.        Left eye: No discharge.     Extraocular Movements: Extraocular movements intact.     Conjunctiva/sclera: Conjunctivae normal.     Pupils: Pupils are equal, round, and reactive to  light.     Comments: No pain or restriction with EOMs.  No tenderness to palpation of the orbits bilaterally  Neck:     Vascular: No JVD.     Trachea: No tracheal deviation.     Comments: No  midline spine TTP, no paraspinal muscle tenderness, no deformity, crepitus, or step-off noted  Cardiovascular:     Rate and Rhythm: Normal rate.  Pulmonary:     Effort: Pulmonary effort is normal.  Abdominal:     General: There is no distension.  Musculoskeletal:     Cervical back: Neck supple. No rigidity or tenderness.     Comments: 5/5 strength of BUE and BLE major muscle groups.  Skin:    General: Skin is warm and dry.     Findings: No erythema.  Neurological:     General: No focal deficit present.     Mental Status: She is alert.     Comments: Mental Status:  Alert, thought content appropriate, able to give a coherent history. Speech fluent without evidence of aphasia. Able to follow 2 step commands without difficulty.  Cranial Nerves:  II:  Peripheral visual fields grossly normal, pupils equal, round, reactive to light III,IV, VI: ptosis not present, extra-ocular motions intact bilaterally  V,VII: smile symmetric, facial light touch sensation equal VIII: hearing grossly normal to voice  X: uvula elevates symmetrically  XI: bilateral shoulder shrug symmetric and strong XII: midline tongue extension without fassiculations Motor:  Normal tone. 5/5 strength of BUE and BLE major muscle groups including strong and equal grip strength and dorsiflexion/plantar flexion Sensory: light touch normal in all extremities. Gait: normal gait and balance. Able to walk on toes and heels with ease.    Psychiatric:        Behavior: Behavior normal.     ED Results / Procedures / Treatments   Labs (all labs ordered are listed, but only abnormal results are displayed) Labs Reviewed - No data to display  EKG None  Radiology CT Maxillofacial Wo Contrast  Result Date: 08/05/2019 CLINICAL DATA:   [place] with positive facial strike, struck jaw on freezer, swelling noted EXAM: CT MAXILLOFACIAL WITHOUT CONTRAST TECHNIQUE: Multidetector CT imaging of the maxillofacial structures was performed. Multiplanar CT image reconstructions were also generated. COMPARISON:  MRI 06/11/2010 FINDINGS: Osseous: No fracture of the bony orbits. Nasal bones are intact. No mid face fractures are seen. The pterygoid plates are intact. The mandible is intact. Temporomandibular joints are normally aligned. No temporal bone fractures are identified. No fractured or avulsed teeth. Orbits: Included orbital structures are unremarkable. Sinuses: Paranasal sinuses and mastoid air cells are predominantly clear. There is leftward nasal septal deviation with a non contacting left-sided nasal septal spur. Middle ear cavities are clear. Ossicular chains are normally configured. Soft tissues: There is no discernible facial hematoma or significant soft tissue swelling readily apparent on the CT images. No soft tissue gas or foreign body is evident. Limited intracranial: No significant or unexpected finding. IMPRESSION: 1. No discernible facial hematoma or significant soft tissue swelling readily apparent on the CT images. 2. No acute facial bone fracture. 3. Leftward nasal septal deviation with a non contacting left-sided nasal septal spur. These results were called by telephone at the time of interpretation on 08/05/2019 at 9:04 pm to provider [facility] , who verbally acknowledged these results. Electronically Signed   By: Lovena Le M.D.   On: 08/05/2019 21:04    Procedures Procedures (including critical care time)  Medications Ordered in ED Medications - No data to display  ED Course  I have reviewed the triage vital signs and the nursing notes.  Pertinent labs & imaging results that were available during my care of the patient were reviewed by me and considered in my medical decision  making (see chart for details).    MDM  Rules/Calculators/A&P                       Patient presenting for evaluation of left jaw pain secondary to injury at 6 PM on Wednesday.  No loss of consciousness.  No signs of serious head injury or basilar skull fracture.  No midline spine tenderness.  She has a normal neurologic examination with no focal deficits.  She is afebrile with stable vital signs.  She is overall quite well-appearing.  No evidence of dental injury or jaw malocclusion.  Imaging today shows no acute facial bone fracture.  She has leftward nasal septal deviation with noncontacting left-sided nasal septal spur which I doubt is related to her injury today.  Doubt intracranial hemorrhage or skull fracture.  Discussed conservative therapy and management with NSAIDs, Tylenol, ice, gentle stretching.  Will discharge with small amount of muscle relaxant to take as needed.  Advised of appropriate use of these medications and potential sedating side effects.  Recommend follow-up with PCP or ENT for reevaluation of symptoms.  Discussed strict ED return precautions. Patient verbalized understanding of and agreement with plan and is safe for discharge home at this time.    Final Clinical Impression(s) / ED Diagnoses Final diagnoses:  Jaw pain  Facial injury, initial encounter    Rx / DC Orders ED Discharge Orders         Ordered    methocarbamol (ROBAXIN) 500 MG tablet  2 times daily     08/06/19 0254    acetaminophen (TYLENOL) 500 MG tablet  Every 6 hours PRN     08/06/19 0254    ibuprofen (ADVIL) 600 MG tablet  Every 6 hours PRN     08/06/19 0254           Renita Papa, PA-C 08/06/19 [place], April, MD 08/06/19 [phone]

## 2019-08-06 NOTE — Discharge Instructions (Signed)
You can alternate 600 mg of ibuprofen and [phone] mg of Tylenol every 3 hours as needed for pain. Do not exceed 4000 mg of Tylenol daily.  Take ibuprofen with meals to avoid upset stomach issues. You can take methocarbamol as needed for muscle relaxation but do not drive, drink alcohol, or operate heavy machinery while taking this medicine as it can cause drowsiness.  I typically recommend only taking this medication before bed.  Apply ice or heat whichever feels best 20 minutes at a time a few times daily.  Do some gentle stretching to avoid muscle stiffness.  Follow-up with PCP or ENT for reevaluation of symptoms.  Return to the emergency department if any concerning signs or symptoms develop such as fevers, difficulty breathing or swallowing, severe worsening swelling, eye pain or pain with eye movements.

## 2019-08-11 ENCOUNTER — Telehealth: Payer: Self-pay

## 2019-08-11 NOTE — Telephone Encounter (Signed)
Copied from [place] [id number]. Topic: Appointment Scheduling - Transfer of Care >> Aug 10, 2019  4:55 PM Rainey Pines A wrote: Patients mother would like a callback in regards to patient transferring care to [facility]. Please advise

## 2019-08-11 NOTE — Telephone Encounter (Signed)
Is this ok?

## 2019-08-12 NOTE — Telephone Encounter (Signed)
Adriana,   I have never seen this patient but I see her mom. I am ok with the switch if you are.  JB

## 2019-08-12 NOTE — Telephone Encounter (Signed)
Sure, if it is what the patient wants, which I believe the patient should be able to express for herself.

## 2019-08-12 NOTE — Telephone Encounter (Signed)
OK.  It is ok to establish her with me from [place].  jb

## 2019-08-18 ENCOUNTER — Other Ambulatory Visit: Payer: Self-pay | Admitting: Physician Assistant

## 2019-08-18 DIAGNOSIS — F419 Anxiety disorder, unspecified: Secondary | ICD-10-CM

## 2019-08-18 DIAGNOSIS — G43109 Migraine with aura, not intractable, without status migrainosus: Secondary | ICD-10-CM

## 2019-08-18 NOTE — Telephone Encounter (Signed)
Requested Prescriptions  Pending Prescriptions Disp Refills  . sertraline (ZOLOFT) 50 MG tablet [Pharmacy Med Name: SERTRALINE 50MG  TABLETS] 135 tablet 0    Sig: TAKE 1 AND 1/2 TABLETS(75 MG) BY MOUTH DAILY     Psychiatry:  Antidepressants - SSRI Failed - 08/18/2019  3:33 AM      Failed - Completed PHQ-2 or PHQ-9 in the last 360 days.      Passed - Valid encounter within last 6 months    Recent Outpatient Visits          3 months ago Anxiety and depression   [facility] Trinna Post, [place]   9 months ago [place] Carles Collet M, [place]   11 months ago Attention deficit hyperactivity disorder (ADHD), unspecified ADHD type   [facility] Trinna Post, [place]   1 year ago [place], [place], [place]   1 year ago Other migraine without status migrainosus, not intractable   [place], Wendee Beavers, [place]      Future Appointments            In 10 months Rubie Maid, MD [facility]           . propranolol (INDERAL) 40 MG tablet [Pharmacy Med Name: PROPRANOLOL 40MG  TABLETS] 180 tablet 0    Sig: TAKE 1 TABLET(40 MG) BY MOUTH TWICE DAILY     Cardiovascular:  Beta Blockers Failed - 08/18/2019  3:33 AM      Failed - Last BP in normal range    BP Readings from Last 1 Encounters:  08/06/19 136/90         Passed - Last Heart Rate in normal range    Pulse Readings from Last 1 Encounters:  08/06/19 62         Passed - Valid encounter within last 6 months    Recent Outpatient Visits          3 months ago Anxiety and depression   [facility] Carles Collet M, [place]   9 months ago [place] Carles Collet M, [place]   11 months ago Attention deficit hyperactivity disorder (ADHD), unspecified ADHD type   [facility] [place], Wendee Beavers, [place]   1 year ago [place], [place], [place]   1 year ago Other migraine without status migrainosus, not intractable   [place], Wendee Beavers, [place]      Future Appointments            In 10 months Rubie Maid, MD [facility]

## 2019-09-14 ENCOUNTER — Encounter: Payer: Self-pay | Admitting: Physician Assistant

## 2019-10-01 ENCOUNTER — Other Ambulatory Visit: Payer: Self-pay | Admitting: Physician Assistant

## 2019-10-01 DIAGNOSIS — F909 Attention-deficit hyperactivity disorder, unspecified type: Secondary | ICD-10-CM

## 2019-10-01 MED ORDER — AMPHETAMINE-DEXTROAMPHET ER 20 MG PO CP24: 20.0000 mg | ORAL_CAPSULE | ORAL | 0 refills | Status: DC

## 2019-10-01 NOTE — Telephone Encounter (Signed)
Can we please schedule follow up for ADHD? Will need a follow up before more refills, thanks. I have refilled for one month.

## 2019-11-12 ENCOUNTER — Other Ambulatory Visit: Payer: Self-pay | Admitting: Physician Assistant

## 2019-11-12 DIAGNOSIS — F909 Attention-deficit hyperactivity disorder, unspecified type: Secondary | ICD-10-CM

## 2019-11-12 NOTE — Telephone Encounter (Signed)
Needs appointment before more refills

## 2019-11-19 ENCOUNTER — Other Ambulatory Visit: Payer: Self-pay

## 2019-11-19 ENCOUNTER — Encounter: Payer: Self-pay | Admitting: Physician Assistant

## 2019-11-19 ENCOUNTER — Ambulatory Visit: Payer: 59 | Admitting: Physician Assistant

## 2019-11-19 VITALS — BP 112/85 | HR 78 | Temp 97.7°F | Wt 113.2 lb

## 2019-11-19 DIAGNOSIS — F909 Attention-deficit hyperactivity disorder, unspecified type: Secondary | ICD-10-CM

## 2019-11-19 DIAGNOSIS — G43809 Other migraine, not intractable, without status migrainosus: Secondary | ICD-10-CM | POA: Diagnosis not present

## 2019-11-19 DIAGNOSIS — F32A Depression, unspecified: Secondary | ICD-10-CM

## 2019-11-19 DIAGNOSIS — F329 Major depressive disorder, single episode, unspecified: Secondary | ICD-10-CM | POA: Diagnosis not present

## 2019-11-19 DIAGNOSIS — F419 Anxiety disorder, unspecified: Secondary | ICD-10-CM

## 2019-11-19 MED ORDER — AMPHETAMINE-DEXTROAMPHET ER 20 MG PO CP24: 20.0000 mg | ORAL_CAPSULE | Freq: Every day | ORAL | 0 refills | Status: DC

## 2019-11-19 MED ORDER — AMPHETAMINE-DEXTROAMPHET ER 20 MG PO CP24: 20.0000 mg | ORAL_CAPSULE | ORAL | 0 refills | Status: DC

## 2019-11-19 NOTE — Patient Instructions (Signed)

## 2019-11-19 NOTE — Telephone Encounter (Signed)
Pt has apt today (11/19/2019)  Thanks,   -Mickel Baas

## 2019-11-19 NOTE — Progress Notes (Signed)
Established patient visit   Patient: Eileen Davis   DOB: 1990/10/10   29 y.o. Female  MRN: [id number] Visit Date: 11/19/2019  Today's healthcare provider: Trinna Post, PA-C   Chief Complaint  Patient presents with  . ADHD  I,Alane Hanssen M Legacy Carrender,acting as a scribe for Trinna Post, PA-C.,have documented all relevant documentation on the behalf of Trinna Post, PA-C,as directed by  Trinna Post, PA-C while in the presence of Trinna Post, PA-C.  Subjective    HPI  Follow up for ADHD  The patient was last seen for this 6 months ago. Changes made at last visit include no changes.  She reports good compliance with treatment. She feels that condition is Improved. She is not having side effects.   Continues setraline 100 mg QD for depression without issue.   Camila as non estrogen birth control is working well for her.   She continues to take propranolol as migraine prohylaxis with good success.  -----------------------------------------------------------------------------------------       Medications: Outpatient Medications Prior to Visit  Medication Sig  . norethindrone (CAMILA) 0.35 MG tablet Take 1 tablet (0.35 mg total) by mouth daily.  . propranolol (INDERAL) 40 MG tablet TAKE 1 TABLET(40 MG) BY MOUTH TWICE DAILY  . rizatriptan (MAXALT) 10 MG tablet Take 1 tablet (10 mg total) by mouth as needed for migraine. May repeat in 2 hours if needed  . sertraline (ZOLOFT) 100 MG tablet Take 1 tablet (100 mg total) by mouth daily.  . traZODone (DESYREL) 50 MG tablet Take 0.5-1 tablets (25-50 mg total) by mouth at bedtime as needed for sleep.  . [DISCONTINUED] amphetamine-dextroamphetamine (ADDERALL XR) 20 MG 24 hr capsule Take 1 capsule (20 mg total) by mouth every morning.  . [DISCONTINUED] amphetamine-dextroamphetamine (ADDERALL) 20 MG tablet Take as needed up to twice daily on days that you do not take the Adderall XR  . [DISCONTINUED] acetaminophen  (TYLENOL) 500 MG tablet Take 1 tablet (500 mg total) by mouth every 6 (six) hours as needed.  . [DISCONTINUED] ibuprofen (ADVIL) 600 MG tablet Take 1 tablet (600 mg total) by mouth every 6 (six) hours as needed.  . [DISCONTINUED] methocarbamol (ROBAXIN) 500 MG tablet Take 1 tablet (500 mg total) by mouth 2 (two) times daily.   No facility-administered medications prior to visit.    Review of Systems  Constitutional: Negative.   Respiratory: Negative.   Cardiovascular: Negative.   Hematological: Negative.   Psychiatric/Behavioral: Positive for decreased concentration. Negative for agitation, self-injury, sleep disturbance and suicidal ideas. The patient is not nervous/anxious.       Objective    BP 112/85 (BP Location: Left Arm, Patient Position: Sitting, Cuff Size: Normal)   Pulse 78   Temp 97.7 F (36.5 C) (Temporal)   Wt 113 lb 3.2 oz (51.3 kg)   BMI 20.70 kg/m    Physical Exam Constitutional:      Appearance: Normal appearance. She is normal weight.  Cardiovascular:     Rate and Rhythm: Normal rate and regular rhythm.     Pulses: Normal pulses.     Heart sounds: Normal heart sounds.  Pulmonary:     Effort: Pulmonary effort is normal.     Breath sounds: Normal breath sounds.  Skin:    General: Skin is warm and dry.  Neurological:     General: No focal deficit present.     Mental Status: She is alert and oriented to person, place, and time.  Psychiatric:  Mood and Affect: Mood normal.        Behavior: Behavior normal.       No results found for any visits on 11/19/19.  Assessment & Plan    1. Attention deficit hyperactivity disorder (ADHD), unspecified ADHD type  - amphetamine-dextroamphetamine (ADDERALL XR) 20 MG 24 hr capsule; Take 1 capsule (20 mg total) by mouth every morning.  Dispense: 30 capsule; Refill: 0 - amphetamine-dextroamphetamine (ADDERALL XR) 20 MG 24 hr capsule; Take 1 capsule (20 mg total) by mouth daily.  Dispense: 30 capsule; Refill:  0 - amphetamine-dextroamphetamine (ADDERALL XR) 20 MG 24 hr capsule; Take 1 capsule (20 mg total) by mouth daily.  Dispense: 30 capsule; Refill: 0  2. Anxiety and depression  Continue Zoloft.  3. Other migraine without status migrainosus, not intractable  Propranolol and maxalt PRN. Non estrogen OCP.    Return in about 4 months (around 03/21/2020) for CPE & ADHD.      ITrinna Post, PA-C, have reviewed all documentation for this visit. The documentation on 11/20/19 for the exam, diagnosis, procedures, and orders are all accurate and complete.    Paulene Floor  [facility] [place] [phone] (phone) [phone] (fax)  [place]

## 2020-01-08 ENCOUNTER — Other Ambulatory Visit: Payer: Self-pay | Admitting: Physician Assistant

## 2020-01-08 DIAGNOSIS — G43109 Migraine with aura, not intractable, without status migrainosus: Secondary | ICD-10-CM

## 2020-01-20 ENCOUNTER — Other Ambulatory Visit: Payer: Self-pay | Admitting: Physician Assistant

## 2020-01-20 DIAGNOSIS — F909 Attention-deficit hyperactivity disorder, unspecified type: Secondary | ICD-10-CM

## 2020-01-22 MED ORDER — AMPHETAMINE-DEXTROAMPHET ER 20 MG PO CP24: 20.0000 mg | ORAL_CAPSULE | ORAL | 0 refills | Status: DC

## 2020-03-22 ENCOUNTER — Encounter: Payer: Self-pay | Admitting: Physician Assistant

## 2020-03-22 ENCOUNTER — Other Ambulatory Visit: Payer: Self-pay

## 2020-03-22 ENCOUNTER — Ambulatory Visit: Payer: 59 | Admitting: Physician Assistant

## 2020-03-22 VITALS — BP 113/77 | HR 86 | Temp 98.4°F | Ht 62.0 in | Wt 118.1 lb

## 2020-03-22 DIAGNOSIS — F419 Anxiety disorder, unspecified: Secondary | ICD-10-CM | POA: Diagnosis not present

## 2020-03-22 DIAGNOSIS — G43809 Other migraine, not intractable, without status migrainosus: Secondary | ICD-10-CM

## 2020-03-22 DIAGNOSIS — Z1159 Encounter for screening for other viral diseases: Secondary | ICD-10-CM

## 2020-03-22 DIAGNOSIS — G43109 Migraine with aura, not intractable, without status migrainosus: Secondary | ICD-10-CM

## 2020-03-22 DIAGNOSIS — G4700 Insomnia, unspecified: Secondary | ICD-10-CM

## 2020-03-22 DIAGNOSIS — Z3041 Encounter for surveillance of contraceptive pills: Secondary | ICD-10-CM

## 2020-03-22 DIAGNOSIS — Z0000 Encounter for general adult medical examination without abnormal findings: Secondary | ICD-10-CM

## 2020-03-22 DIAGNOSIS — F909 Attention-deficit hyperactivity disorder, unspecified type: Secondary | ICD-10-CM

## 2020-03-22 DIAGNOSIS — F32A Depression, unspecified: Secondary | ICD-10-CM

## 2020-03-22 MED ORDER — TRAZODONE HCL 50 MG PO TABS: 25.0000 mg | ORAL_TABLET | Freq: Every evening | ORAL | 3 refills | Status: DC | PRN

## 2020-03-22 MED ORDER — AMPHETAMINE-DEXTROAMPHET ER 20 MG PO CP24: 20.0000 mg | ORAL_CAPSULE | Freq: Every day | ORAL | 0 refills | Status: DC

## 2020-03-22 MED ORDER — SERTRALINE HCL 100 MG PO TABS: 100.0000 mg | ORAL_TABLET | Freq: Every day | ORAL | 1 refills | Status: DC

## 2020-03-22 MED ORDER — AMPHETAMINE-DEXTROAMPHET ER 20 MG PO CP24: 20.0000 mg | ORAL_CAPSULE | ORAL | 0 refills | Status: DC

## 2020-03-22 MED ORDER — PROPRANOLOL HCL 40 MG PO TABS: ORAL_TABLET | ORAL | 0 refills | Status: DC

## 2020-03-22 MED ORDER — NORETHINDRONE 0.35 MG PO TABS: ORAL_TABLET | ORAL | 3 refills | Status: DC

## 2020-03-22 MED ORDER — AMPHETAMINE-DEXTROAMPHETAMINE 20 MG PO TABS: 20.0000 mg | ORAL_TABLET | Freq: Two times a day (BID) | ORAL | 0 refills | Status: DC

## 2020-03-22 MED ORDER — RIZATRIPTAN BENZOATE 10 MG PO TABS: 10.0000 mg | ORAL_TABLET | ORAL | 0 refills | Status: DC | PRN

## 2020-03-22 NOTE — Patient Instructions (Addendum)
Health Maintenance, Female Adopting a healthy lifestyle and getting preventive care are important in promoting health and wellness. Ask your health care provider about:  The right schedule for you to have regular tests and exams.  Things you can do on your own to prevent diseases and keep yourself healthy. What should I know about diet, weight, and exercise? Eat a healthy diet   Eat a diet that includes plenty of vegetables, fruits, low-fat dairy products, and lean protein.  Do not eat a lot of foods that are high in solid fats, added sugars, or sodium. Maintain a healthy weight Body mass index (BMI) is used to identify weight problems. It estimates body fat based on height and weight. Your health care provider can help determine your BMI and help you achieve or maintain a healthy weight. Get regular exercise Get regular exercise. This is one of the most important things you can do for your health. Most adults should:  Exercise for at least 150 minutes each week. The exercise should increase your heart rate and make you sweat (moderate-intensity exercise).  Do strengthening exercises at least twice a week. This is in addition to the moderate-intensity exercise.  Spend less time sitting. Even light physical activity can be beneficial. Watch cholesterol and blood lipids Have your blood tested for lipids and cholesterol at 27 years of age, then have this test every 5 years. Have your cholesterol levels checked more often if:  Your lipid or cholesterol levels are high.  You are older than 27 years of age.  You are at high risk for heart disease. What should I know about cancer screening? Depending on your health history and family history, you may need to have cancer screening at various ages. This may include screening for:  Breast cancer.  Cervical cancer.  Colorectal cancer.  Skin cancer.  Lung cancer. What should I know about heart disease, diabetes, and high blood  pressure? Blood pressure and heart disease  High blood pressure causes heart disease and increases the risk of stroke. This is more likely to develop in people who have high blood pressure readings, are of African descent, or are overweight.  Have your blood pressure checked: ? Every 3-5 years if you are 18-39 years of age. ? Every year if you are 40 years old or older. Diabetes Have regular diabetes screenings. This checks your fasting blood sugar level. Have the screening done:  Once every three years after age 40 if you are at a normal weight and have a low risk for diabetes.  More often and at a younger age if you are overweight or have a high risk for diabetes. What should I know about preventing infection? Hepatitis B If you have a higher risk for hepatitis B, you should be screened for this virus. Talk with your health care provider to find out if you are at risk for hepatitis B infection. Hepatitis C Testing is recommended for:  Everyone born from 1945 through 1965.  Anyone with known risk factors for hepatitis C. Sexually transmitted infections (STIs)  Get screened for STIs, including gonorrhea and chlamydia, if: ? You are sexually active and are younger than 27 years of age. ? You are older than 27 years of age and your health care provider tells you that you are at risk for this type of infection. ? Your sexual activity has changed since you were last screened, and you are at increased risk for chlamydia or gonorrhea. Ask your health care provider if   you are at risk.  Ask your health care provider about whether you are at high risk for HIV. Your health care provider may recommend a prescription medicine to help prevent HIV infection. If you choose to take medicine to prevent HIV, you should first get tested for HIV. You should then be tested every 3 months for as long as you are taking the medicine. Pregnancy  If you are about to stop having your period (premenopausal) and  you may become pregnant, seek counseling before you get pregnant.  Take 400 to 800 micrograms (mcg) of folic acid every day if you become pregnant.  Ask for birth control (contraception) if you want to prevent pregnancy. Osteoporosis and menopause Osteoporosis is a disease in which the bones lose minerals and strength with aging. This can result in bone fractures. If you are 65 years old or older, or if you are at risk for osteoporosis and fractures, ask your health care provider if you should:  Be screened for bone loss.  Take a calcium or vitamin D supplement to lower your risk of fractures.  Be given hormone replacement therapy (HRT) to treat symptoms of menopause. Follow these instructions at home: Lifestyle  Do not use any products that contain nicotine or tobacco, such as cigarettes, e-cigarettes, and chewing tobacco. If you need help quitting, ask your health care provider.  Do not use street drugs.  Do not share needles.  Ask your health care provider for help if you need support or information about quitting drugs. Alcohol use  Do not drink alcohol if: ? Your health care provider tells you not to drink. ? You are pregnant, may be pregnant, or are planning to become pregnant.  If you drink alcohol: ? Limit how much you use to 0-1 drink a day. ? Limit intake if you are breastfeeding.  Be aware of how much alcohol is in your drink. In the [place] one drink equals one 12 oz bottle of beer (355 mL), one 5 oz glass of wine (148 mL), or one 1 oz glass of hard liquor (44 mL). General instructions  Schedule regular health, dental, and eye exams.  Stay current with your vaccines.  Tell your health care provider if: ? You often feel depressed. ? You have ever been abused or do not feel safe at home. Summary  Adopting a healthy lifestyle and getting preventive care are important in promoting health and wellness.  Follow your health care provider's instructions about healthy  diet, exercising, and getting tested or screened for diseases.  Follow your health care provider's instructions on monitoring your cholesterol and blood pressure. This information is not intended to replace advice given to you by your health care provider. Make sure you discuss any questions you have with your health care provider. Document Revised: 04/02/2018 Document Reviewed: 04/02/2018 [employer] Patient Education  2020 [employer]  

## 2020-03-22 NOTE — Progress Notes (Signed)
Complete physical exam   Patient: Eileen Davis   DOB: 07/12/1990   29 y.o. Female  MRN: [id number] Visit Date: 03/22/2020  Today's healthcare provider: Trinna Post, PA-C   Chief Complaint  Patient presents with  . Annual Exam  . ADHD   Subjective    Eileen Davis is a 29 y.o. female who presents today for a complete physical exam.  She reports consuming a general diet. The patient has a physically strenuous job, but has no regular exercise apart from work.  She generally feels well. She reports sleeping fairly well. She does have additional problems to discuss today.  HPI  Follow up for ADHD  The patient was last seen for this 4 months ago. Changes made at last visit include continue current medication.  She reports good compliance with treatment. She feels that condition is Improved. She is not having side effects.    ----------------------------------------------------------------------------------------- Currently on progesterone only OCP - would like to switch to Nexplanon. She is due for PAP smear, plans to get PAP with her or return here if she is unable to get in.   Reports some easier bruising recently. She is not taking anything like ibuprofen or naproxen.   Depression, Follow-up  She  was last seen for this 4 months ago. Changes made at last visit include continue zoloft.   She reports excellent compliance with treatment. She is not having side effects.   She reports excellent tolerance of treatment. Current symptoms include: depressed mood She feels she is Unchanged since last visit.  Depression screen [facility] 2/9 01/03/2017 01/03/2017 10/19/2016  Decreased Interest 0 0 0  Down, Depressed, Hopeless 1 1 0  PHQ - 2 Score 1 1 0  Altered sleeping 0 - 3  Tired, decreased energy 0 - 3  Change in appetite 0 - 3  Feeling bad or failure about yourself  1 - 0  Trouble concentrating 0 - 3  Moving slowly or fidgety/restless 0 - 3  Suicidal thoughts 0 - 0   PHQ-9 Score 2 - 15  Difficult doing work/chores Not difficult at all - -    -----------------------------------------------------------------------------------------  ADHD: Patient uses adderall 20 mg XR once daily 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 When she is not working her shifts, she uses Adderall 20 mg IR QD to BID.   Past Medical History:  Diagnosis Date  . ADHD   . Anxiety and depression   . Depression   . Migraine   . Recurrent UTI   . Ureteral reflux 2005   Right side   Past Surgical History:  Procedure Laterality Date  . CERVICAL BIOPSY  W/ LOOP ELECTRODE EXCISION    . WISDOM TOOTH EXTRACTION  06/2016   Social History   Socioeconomic History  . Marital status: Significant Other    Spouse name: Not on file  . Number of children: Not on file  . Years of education: Not on file  . Highest education level: Not on file  Occupational History  . Not on file  Tobacco Use  . Smoking status: Former Smoker    Packs/day: 0.00  . Smokeless tobacco: Never Used  Vaping Use  . Vaping Use: Former  Substance and Sexual Activity  . Alcohol use: Yes    Comment: Rarely  . Drug use: No  . Sexual activity: Yes    Birth control/protection: Condom, Pill  Other Topics Concern  . Not on file  Social History Narrative  .  Not on file   Social Determinants of Health   Financial Resource Strain:   . Difficulty of Paying Living Expenses: Not on file  Food Insecurity:   . Worried About Charity fundraiser in the Last Year: Not on file  . Ran Out of Food in the Last Year: Not on file  Transportation Needs:   . Lack of Transportation (Medical): Not on file  . Lack of Transportation (Non-Medical): Not on file  Physical Activity:   . Days of Exercise per Week: Not on file  . Minutes of Exercise per Session: Not on file  Stress:   . Feeling of Stress : Not on file  Social Connections:   . Frequency of Communication with Friends and Family: Not on file  . Frequency  of Social Gatherings with Friends and Family: Not on file  . Attends Religious Services: Not on file  . Active Member of Clubs or Organizations: Not on file  . Attends Archivist Meetings: Not on file  . Marital Status: Not on file  Intimate Partner Violence:   . Fear of Current or Ex-Partner: Not on file  . Emotionally Abused: Not on file  . Physically Abused: Not on file  . Sexually Abused: Not on file   Family Status  Relation Name Status  . Mother  Alive  . Father  Alive  . Brother  Alive  . MGM  Deceased  . MGF  Deceased  . PGM  Deceased  . PGF  Deceased   Family History  Problem Relation Age of Onset  . Osteoporosis Mother   . Diabetes Father   . Hypertension Father   . Healthy Brother   . ADD / ADHD Brother   . Ovarian cancer Maternal Grandmother   . Prostate cancer Maternal Grandfather   . Congestive Heart Failure Paternal Grandmother   . CVA Paternal Grandmother   . Lung cancer Paternal Grandfather    No Known Allergies  Patient Care Team: Paulene Floor as PCP - General (Physician Assistant)   Medications: Outpatient Medications Prior to Visit  Medication Sig  . [DISCONTINUED] amphetamine-dextroamphetamine (ADDERALL XR) 20 MG 24 hr capsule Take 1 capsule (20 mg total) by mouth daily.  . [DISCONTINUED] amphetamine-dextroamphetamine (ADDERALL XR) 20 MG 24 hr capsule Take 1 capsule (20 mg total) by mouth daily.  . [DISCONTINUED] amphetamine-dextroamphetamine (ADDERALL XR) 20 MG 24 hr capsule Take 1 capsule (20 mg total) by mouth every morning.  . [DISCONTINUED] norethindrone (CAMILA) 0.35 MG tablet Take 1 tablet (0.35 mg total) by mouth daily.  . [DISCONTINUED] propranolol (INDERAL) 40 MG tablet TAKE 1 TABLET(40 MG) BY MOUTH TWICE DAILY  . [DISCONTINUED] rizatriptan (MAXALT) 10 MG tablet Take 1 tablet (10 mg total) by mouth as needed for migraine. May repeat in 2 hours if needed  . [DISCONTINUED] sertraline (ZOLOFT) 100 MG tablet Take 1 tablet  (100 mg total) by mouth daily.  . [DISCONTINUED] traZODone (DESYREL) 50 MG tablet Take 0.5-1 tablets (25-50 mg total) by mouth at bedtime as needed for sleep.   No facility-administered medications prior to visit.    Review of Systems  Constitutional: Negative.   HENT: Negative.   Eyes: Negative.   Respiratory: Negative.   Cardiovascular: Negative.   Gastrointestinal: Negative.   Endocrine: Negative.   Genitourinary: Negative.   Musculoskeletal: Negative.   Skin: Negative.   Allergic/Immunologic: Negative.   Neurological: Negative.   Hematological: Negative.   Psychiatric/Behavioral: Negative.       Objective  BP 113/77 (BP Location: Right Arm, Patient Position: Sitting, Cuff Size: Large)   Pulse 86   Temp 98.4 F (36.9 C) (Oral)   Ht 5\' 2"  (1.575 m)   Wt 118 lb 1.6 oz (53.6 kg)   SpO2 97%   BMI 21.60 kg/m    Physical Exam Constitutional:      Appearance: Normal appearance.  HENT:     Right Ear: Tympanic membrane, ear canal and external ear normal.     Left Ear: Tympanic membrane, ear canal and external ear normal.  Cardiovascular:     Rate and Rhythm: Normal rate and regular rhythm.     Pulses: Normal pulses.     Heart sounds: Normal heart sounds.  Pulmonary:     Effort: Pulmonary effort is normal.     Breath sounds: Normal breath sounds.  Abdominal:     General: Abdomen is flat. Bowel sounds are normal.     Palpations: Abdomen is soft.  Musculoskeletal:     Cervical back: Normal range of motion and neck supple.  Lymphadenopathy:     Cervical: No cervical adenopathy.  Skin:    General: Skin is warm and dry.  Neurological:     General: No focal deficit present.     Mental Status: She is alert and oriented to person, place, and time. Mental status is at baseline.  Psychiatric:        Mood and Affect: Mood normal.        Behavior: Behavior normal.       Last depression screening scores PHQ 2/9 Scores 01/03/2017 01/03/2017 10/19/2016  PHQ - 2 Score 1  1 0  PHQ- 9 Score 2 - 15   Last fall risk screening Fall Risk  01/03/2017  Falls in the past year? No   Last Audit-C alcohol use screening No flowsheet data found. A score of 3 or more in women, and 4 or more in men indicates increased risk for alcohol abuse, EXCEPT if all of the points are from question 1   No results found for any visits on 03/22/20.  Assessment & Plan    Routine Health Maintenance and Physical Exam  Exercise Activities and Dietary recommendations Goals   None      There is no immunization history on file for this patient.  Health Maintenance  Topic Date Due  . Hepatitis C Screening  Never done  . COVID-19 Vaccine (1) 04/07/2020 (Originally 05/19/2004)  . INFLUENZA VACCINE  07/21/2020 (Originally 11/22/2019)  . TETANUS/TDAP  03/22/2021 (Originally 05/20/2011)  . PAP-Cervical Cytology Screening  06/26/2020  . PAP SMEAR-Modifier  06/26/2020  . HIV Screening  Completed    Discussed health benefits of physical activity, and encouraged her to engage in regular exercise appropriate for her age and condition.  1. Annual physical exam  - TSH - Lipid panel - Comprehensive metabolic panel - CBC with Differential/Platelet  2. Attention deficit hyperactivity disorder (ADHD), unspecified ADHD type  - amphetamine-dextroamphetamine (ADDERALL XR) 20 MG 24 hr capsule; Take 1 capsule (20 mg total) by mouth daily.  Dispense: 30 capsule; Refill: 0 - amphetamine-dextroamphetamine (ADDERALL XR) 20 MG 24 hr capsule; Take 1 capsule (20 mg total) by mouth daily.  Dispense: 30 capsule; Refill: 0 - amphetamine-dextroamphetamine (ADDERALL XR) 20 MG 24 hr capsule; Take 1 capsule (20 mg total) by mouth every morning.  Dispense: 30 capsule; Refill: 0 - amphetamine-dextroamphetamine (ADDERALL) 20 MG tablet; Take 1 tablet (20 mg total) by mouth 2 (two) times daily.  Dispense: 60 tablet;  Refill: 0  3. Other migraine without status migrainosus, not intractable  - rizatriptan (MAXALT)  10 MG tablet; Take 1 tablet (10 mg total) by mouth as needed for migraine. May repeat in 2 hours if needed  Dispense: 10 tablet; Refill: 0  4. Encounter for surveillance of contraceptive pills  - norethindrone (CAMILA) 0.35 MG tablet; Take on pill daily.  Dispense: 84 tablet; Refill: 3  5. Encounter for hepatitis C screening test for low risk patient  - Hepatitis C Antibody  6. Anxiety and depression  - sertraline (ZOLOFT) 100 MG tablet; Take 1 tablet (100 mg total) by mouth daily.  Dispense: 90 tablet; Refill: 1  7. Migraine with aura and without status migrainosus, not intractable  - propranolol (INDERAL) 40 MG tablet; Take one pill twice daily.  Dispense: 180 tablet; Refill: 0  8. Insomnia, unspecified type  - traZODone (DESYREL) 50 MG tablet; Take 0.5-1 tablets (25-50 mg total) by mouth at bedtime as needed for sleep.  Dispense: 30 tablet; Refill: 3   Return in about 4 months (around 07/20/2020) for ADHD .     ITrinna Post, PA-C, have reviewed all documentation for this visit. The documentation on 03/22/20 for the exam, diagnosis, procedures, and orders are all accurate and complete.  The entirety of the information documented in the History of Present Illness, Review of Systems and Physical Exam were personally obtained by me. Portions of this information were initially documented by [facility] and reviewed by me for thoroughness and accuracy.     Paulene Floor  [facility] [phone] (phone) [phone] (fax)  [place]

## 2020-03-23 LAB — COMPREHENSIVE METABOLIC PANEL
ALT: 11 IU/L (ref 0–32)
AST: 15 IU/L (ref 0–40)
Albumin/Globulin Ratio: 2 (ref 1.2–2.2)
Albumin: 4.3 g/dL (ref 3.9–5.0)
Alkaline Phosphatase: 124 IU/L — ABNORMAL HIGH (ref 44–121)
BUN/Creatinine Ratio: 14 (ref 9–23)
BUN: 10 mg/dL (ref 6–20)
Bilirubin Total: 0.5 mg/dL (ref 0.0–1.2)
CO2: 23 mmol/L (ref 20–29)
Calcium: 9.1 mg/dL (ref 8.7–10.2)
Chloride: 103 mmol/L (ref 96–106)
Creatinine, Ser: 0.7 mg/dL (ref 0.57–1.00)
GFR calc Af Amer: 137 mL/min/{1.73_m2} (ref >59)
GFR calc non Af Amer: 119 mL/min/{1.73_m2} (ref >59)
Globulin, Total: 2.1 g/dL (ref 1.5–4.5)
Glucose: 75 mg/dL (ref 65–99)
Potassium: 4.1 mmol/L (ref 3.5–5.2)
Sodium: 138 mmol/L (ref 134–144)
Total Protein: 6.4 g/dL (ref 6.0–8.5)

## 2020-03-23 LAB — CBC WITH DIFFERENTIAL/PLATELET
Basophils Absolute: 0.1 10*3/uL (ref 0.0–0.2)
Basos: 1 %
EOS (ABSOLUTE): 0.2 10*3/uL (ref 0.0–0.4)
Eos: 2 %
Hematocrit: 42.2 % (ref 34.0–46.6)
Hemoglobin: 14.3 g/dL (ref 11.1–15.9)
Immature Grans (Abs): 0 10*3/uL (ref 0.0–0.1)
Immature Granulocytes: 0 %
Lymphocytes Absolute: 2.1 10*3/uL (ref 0.7–3.1)
Lymphs: 25 %
MCH: 30.8 pg (ref 26.6–33.0)
MCHC: 33.9 g/dL (ref 31.5–35.7)
MCV: 91 fL (ref 79–97)
Monocytes Absolute: 0.8 10*3/uL (ref 0.1–0.9)
Monocytes: 10 %
Neutrophils Absolute: 5.4 10*3/uL (ref 1.4–7.0)
Neutrophils: 62 %
Platelets: 200 10*3/uL (ref 150–450)
RBC: 4.65 x10E6/uL (ref 3.77–5.28)
RDW: 11.9 % (ref 11.7–15.4)
WBC: 8.6 10*3/uL (ref 3.4–10.8)

## 2020-03-23 LAB — LIPID PANEL
Chol/HDL Ratio: 2.5 ratio (ref 0.0–4.4)
Cholesterol, Total: 180 mg/dL (ref 100–199)
HDL: 73 mg/dL (ref >39)
LDL Chol Calc (NIH): 96 mg/dL (ref 0–99)
Triglycerides: 58 mg/dL (ref 0–149)
VLDL Cholesterol Cal: 11 mg/dL (ref 5–40)

## 2020-03-23 LAB — TSH: TSH: 1.86 u[IU]/mL (ref 0.450–4.500)

## 2020-03-23 LAB — HEPATITIS C ANTIBODY: Hep C Virus Ab: 0.2 s/co ratio (ref 0.0–0.9)

## 2020-05-10 ENCOUNTER — Other Ambulatory Visit: Payer: Self-pay | Admitting: Physician Assistant

## 2020-05-10 DIAGNOSIS — G43109 Migraine with aura, not intractable, without status migrainosus: Secondary | ICD-10-CM

## 2020-06-01 ENCOUNTER — Other Ambulatory Visit: Payer: Self-pay

## 2020-06-01 ENCOUNTER — Ambulatory Visit: Payer: 59 | Admitting: Adult Health

## 2020-06-01 ENCOUNTER — Encounter: Payer: Self-pay | Admitting: Adult Health

## 2020-06-01 VITALS — BP 96/60 | HR 94 | Temp 98.6°F | Resp 18 | Wt 123.0 lb

## 2020-06-01 DIAGNOSIS — I471 Supraventricular tachycardia, unspecified: Secondary | ICD-10-CM | POA: Insufficient documentation

## 2020-06-01 DIAGNOSIS — N3090 Cystitis, unspecified without hematuria: Secondary | ICD-10-CM

## 2020-06-01 DIAGNOSIS — M899 Disorder of bone, unspecified: Secondary | ICD-10-CM | POA: Diagnosis not present

## 2020-06-01 DIAGNOSIS — R1031 Right lower quadrant pain: Secondary | ICD-10-CM | POA: Diagnosis not present

## 2020-06-01 LAB — POCT URINALYSIS DIPSTICK
Glucose, UA: NEGATIVE
Ketones, UA: NEGATIVE
Leukocytes, UA: NEGATIVE
Nitrite, UA: NEGATIVE
Protein, UA: POSITIVE — AB
Spec Grav, UA: 1.02 (ref 1.010–1.025)
Urobilinogen, UA: 0.2 E.U./dL
pH, UA: 6 (ref 5.0–8.0)

## 2020-06-01 MED ORDER — PHENAZOPYRIDINE HCL 95 MG PO TABS: 95.0000 mg | ORAL_TABLET | Freq: Three times a day (TID) | ORAL | 0 refills | Status: DC | PRN

## 2020-06-01 MED ORDER — FLUCONAZOLE 150 MG PO TABS: 150.0000 mg | ORAL_TABLET | ORAL | 0 refills | Status: DC

## 2020-06-01 MED ORDER — NITROFURANTOIN MONOHYD MACRO 100 MG PO CAPS: 100.0000 mg | ORAL_CAPSULE | Freq: Two times a day (BID) | ORAL | 0 refills | Status: AC

## 2020-06-01 MED ORDER — ONDANSETRON HCL 4 MG PO TABS: 4.0000 mg | ORAL_TABLET | Freq: Three times a day (TID) | ORAL | 0 refills | Status: DC | PRN

## 2020-06-01 NOTE — Progress Notes (Signed)
Established patient visit   Patient: Eileen Davis   DOB: 06/19/1990   30 y.o. Female  MRN: [id number] Visit Date: 06/01/2020  Today's healthcare provider: Marcille Buffy, FNP   Chief Complaint  Patient presents with  . Dysuria   Subjective    Dysuria  This is a new problem. The quality of the pain is described as stabbing and shooting. There has been no fever (99.9). Associated symptoms include chills (NONE TODAY. ), flank pain, hematuria and nausea. Pertinent negatives include no discharge, frequency, hesitancy, possible pregnancy, sweats or vomiting. She has tried acetaminophen, NSAIDs and increased fluids for the symptoms. The treatment provided mild relief. Her past medical history is significant for kidney stones and recurrent UTIs.    urethral reflux on right side. She often has cystitis, she has also had pylonephritis.  She has had some mid urinary bleeding.   Denies any history of lithotripsy.  Denies any concerns for STD's.   Denies dizziness, lightheadedness, pre syncopal or syncopal episodes.  denies any change in bowel habits or rectal bleeding.   Patient's last menstrual period was 05/25/2020 (lmp unknown).      Medications: Outpatient Medications Prior to Visit  Medication Sig  . amphetamine-dextroamphetamine (ADDERALL XR) 20 MG 24 hr capsule Take 1 capsule (20 mg total) by mouth daily.  Marland Kitchen amphetamine-dextroamphetamine (ADDERALL XR) 20 MG 24 hr capsule Take 1 capsule (20 mg total) by mouth daily.  Marland Kitchen amphetamine-dextroamphetamine (ADDERALL XR) 20 MG 24 hr capsule Take 1 capsule (20 mg total) by mouth every morning.  Marland Kitchen amphetamine-dextroamphetamine (ADDERALL) 20 MG tablet Take 1 tablet (20 mg total) by mouth 2 (two) times daily.  . norethindrone (CAMILA) 0.35 MG tablet Take on pill daily.  . propranolol (INDERAL) 40 MG tablet Take one pill twice daily.  . rizatriptan (MAXALT) 10 MG tablet Take 1 tablet (10 mg total) by mouth as needed for  migraine. May repeat in 2 hours if needed  . sertraline (ZOLOFT) 100 MG tablet Take 1 tablet (100 mg total) by mouth daily.  . traZODone (DESYREL) 50 MG tablet Take 0.5-1 tablets (25-50 mg total) by mouth at bedtime as needed for sleep.   No facility-administered medications prior to visit.    Review of Systems  Constitutional: Positive for chills (NONE TODAY. ). Negative for activity change, appetite change, diaphoresis, fatigue and fever.  HENT: Negative.   Respiratory: Negative.   Cardiovascular: Negative.   Gastrointestinal: Positive for nausea. Negative for abdominal distention, abdominal pain, anal bleeding, blood in stool, constipation, diarrhea, rectal pain and vomiting.  Genitourinary: Positive for dysuria, flank pain and hematuria. Negative for difficulty urinating, dyspareunia, enuresis, frequency, genital sores and hesitancy.  Skin: Negative.   Neurological: Negative.   Hematological: Negative.   Psychiatric/Behavioral: Negative.     Last CBC Lab Results  Component Value Date   WBC 8.6 03/22/2020   HGB 14.3 03/22/2020   HCT 42.2 03/22/2020   MCV 91 03/22/2020   MCH 30.8 03/22/2020   RDW 11.9 03/22/2020   PLT 200 56/43/3295   Last metabolic panel Lab Results  Component Value Date   GLUCOSE 75 03/22/2020   NA 138 03/22/2020   K 4.1 03/22/2020   CL 103 03/22/2020   CO2 23 03/22/2020   BUN 10 03/22/2020   CREATININE 0.70 03/22/2020   GFRNONAA 119 03/22/2020   GFRAA 137 03/22/2020   CALCIUM 9.1 03/22/2020   PROT 6.4 03/22/2020   ALBUMIN 4.3 03/22/2020   LABGLOB 2.1 03/22/2020  AGRATIO 2.0 03/22/2020   BILITOT 0.5 03/22/2020   ALKPHOS 124 (H) 03/22/2020   AST 15 03/22/2020   ALT 11 03/22/2020   ANIONGAP 6 12/06/2015       Objective    BP 96/60   Pulse 94   Temp 98.6 F (37 C) (Oral)   Resp 18   Wt 123 lb (55.8 kg)   LMP 05/25/2020 (LMP Unknown)   SpO2 100%   BMI 22.50 kg/m  BP Readings from Last 3 Encounters:  06/01/20 96/60  03/22/20 113/77   11/19/19 112/85   Wt Readings from Last 3 Encounters:  06/01/20 123 lb (55.8 kg)  03/22/20 118 lb 1.6 oz (53.6 kg)  11/19/19 113 lb 3.2 oz (51.3 kg)       Physical Exam Constitutional:      Appearance: Normal appearance. She is not ill-appearing or diaphoretic.     Comments: Patient appers well, not sickly. Speaking in complete sentences. Patient moves on and off of exam table and in room without difficulty. Gait is normal in hall and in room. Patient is oriented to person place time and situation. Patient answers questions appropriately and engages eye contact and verbal dialect with provider.   HENT:     Mouth/Throat:     Pharynx: Oropharynx is clear.  Eyes:     Extraocular Movements: Extraocular movements intact.     Pupils: Pupils are equal, round, and reactive to light.  Cardiovascular:     Rate and Rhythm: Normal rate and regular rhythm.     Pulses: Normal pulses.     Heart sounds: Normal heart sounds. No murmur heard. No friction rub. No gallop.   Pulmonary:     Effort: Pulmonary effort is normal. No respiratory distress.     Breath sounds: Normal breath sounds. No stridor. No wheezing, rhonchi or rales.  Chest:     Chest wall: No tenderness.  Abdominal:     General: There is no distension.     Palpations: Abdomen is soft. There is no mass.     Tenderness: There is abdominal tenderness in the right lower quadrant, suprapubic area and left lower quadrant. There is no right CVA tenderness, left CVA tenderness, guarding or rebound.     Hernia: No hernia is present.  Musculoskeletal:     Cervical back: Normal range of motion and neck supple.     Right lower leg: No edema.     Left lower leg: No edema.  Skin:    General: Skin is warm.     Findings: No erythema or rash.  Neurological:     Mental Status: She is alert and oriented to person, place, and time.  Psychiatric:        Mood and Affect: Mood normal.        Behavior: Behavior normal.        Thought Content:  Thought content normal.        Judgment: Judgment normal.      Results for orders placed or performed in visit on 06/01/20  POCT urinalysis dipstick  Result Value Ref Range   Color, UA yellow    Clarity, UA clear    Glucose, UA Negative Negative   Bilirubin, UA small    Ketones, UA negative    Spec Grav, UA 1.020 1.010 - 1.025   Blood, UA moderate    pH, UA 6.0 5.0 - 8.0   Protein, UA Positive (A) Negative   Urobilinogen, UA 0.2 0.2 or 1.0 E.U./dL  Nitrite, UA negative    Leukocytes, UA Negative Negative   Appearance     Odor      Assessment & Plan     Cystitis - Plan: Urine Culture, POCT urinalysis dipstick, nitrofurantoin, macrocrystal-monohydrate, (MACROBID) 100 MG capsule, fluconazole (DIFLUCAN) 150 MG tablet, ondansetron (ZOFRAN) 4 MG tablet  RLQ abdominal pain - Plan: DG Abd 1 View   Meds ordered this encounter  Medications  . nitrofurantoin, macrocrystal-monohydrate, (MACROBID) 100 MG capsule    Sig: Take 1 capsule (100 mg total) by mouth 2 (two) times daily for 7 days.    Dispense:  14 capsule    Refill:  0  . fluconazole (DIFLUCAN) 150 MG tablet    Sig: Take 1 tablet (150 mg total) by mouth as directed. Take one tablet by mouth on day 1. May repeat dose of one tablet by mouth on day four.    Dispense:  2 tablet    Refill:  0  . ondansetron (ZOFRAN) 4 MG tablet    Sig: Take 1 tablet (4 mg total) by mouth every 8 (eight) hours as needed for nausea or vomiting.    Dispense:  20 tablet    Refill:  0  . phenazopyridine (PYRIDIUM) 95 MG tablet    Sig: Take 1 tablet (95 mg total) by mouth 3 (three) times daily as needed for pain (not for more than three days).    Dispense:  10 tablet    Refill:  0    Orders Placed This Encounter  Procedures  . Urine Culture  . DG Abd 1 View    Order Specific Question:   Reason for Exam (SYMPTOM  OR DIAGNOSIS REQUIRED)    Answer:   pain in pelvic area .    Order Specific Question:   Is patient pregnant?    Answer:   No     Order Specific Question:   Preferred imaging location?    Answer:   [facility]  . POCT urinalysis dipstick  KUB to rule out calcium stone.   Red Flags discussed. The patient was given clear instructions to go to ER or return to medical center if any red flags develop, symptoms do not improve, worsen or new problems develop. They verbalized understanding.   Return if symptoms worsen or fail to improve, for at any time for any worsening symptoms, Go to Emergency room/ urgent care if worse.     The entirety of the information documented in the History of Present Illness, Review of Systems and Physical Exam were personally obtained by me. Portions of this information were initially documented by the CMA and reviewed by me for thoroughness and accuracy.      Marcille Buffy, [place] [phone] (phone) [phone] (fax)  [place]

## 2020-06-01 NOTE — Patient Instructions (Signed)
Urinary Tract Infection, Adult A urinary tract infection (UTI) is an infection of any part of the urinary tract. The urinary tract includes:  The kidneys.  The ureters.  The bladder.  The urethra. These organs make, store, and get rid of pee (urine) in the body. What are the causes? This infection is caused by germs (bacteria) in your genital area. These germs grow and cause swelling (inflammation) of your urinary tract. What increases the risk? The following factors may make you more likely to develop this condition:  Using a small, thin tube (catheter) to drain pee.  Not being able to control when you pee or poop (incontinence).  Being female. If you are female, these things can increase the risk: ? Using these methods to prevent pregnancy:  A medicine that kills sperm (spermicide).  A device that blocks sperm (diaphragm). ? Having low levels of a female hormone (estrogen). ? Being pregnant. You are more likely to develop this condition if:  You have genes that add to your risk.  You are sexually active.  You take antibiotic medicines.  You have trouble peeing because of: ? A prostate that is bigger than normal, if you are female. ? A blockage in the part of your body that drains pee from the bladder. ? A kidney stone. ? A nerve condition that affects your bladder. ? Not getting enough to drink. ? Not peeing often enough.  You have other conditions, such as: ? Diabetes. ? A weak disease-fighting system (immune system). ? Sickle cell disease. ? Gout. ? Injury of the spine. What are the signs or symptoms? Symptoms of this condition include:  Needing to pee right away.  Peeing small amounts often.  Pain or burning when peeing.  Blood in the pee.  Pee that smells bad or not like normal.  Trouble peeing.  Pee that is cloudy.  Fluid coming from the vagina, if you are female.  Pain in the belly or lower back. Other symptoms include:  Vomiting.  Not  feeling hungry.  Feeling mixed up (confused). This may be the first symptom in older adults.  Being tired and grouchy (irritable).  A fever.  Watery poop (diarrhea). How is this treated?  Taking antibiotic medicine.  Taking other medicines.  Drinking enough water. In some cases, you may need to see a specialist. Follow these instructions at home: Medicines  Take over-the-counter and prescription medicines only as told by your doctor.  If you were prescribed an antibiotic medicine, take it as told by your doctor. Do not stop taking it even if you start to feel better. General instructions  Make sure you: ? Pee until your bladder is empty. ? Do not hold pee for a long time. ? Empty your bladder after sex. ? Wipe from front to back after peeing or pooping if you are a female. Use each tissue one time when you wipe.  Drink enough fluid to keep your pee pale yellow.  Keep all follow-up visits.   Contact a doctor if:  You do not get better after 1-2 days.  Your symptoms go away and then come back. Get help right away if:  You have very bad back pain.  You have very bad pain in your lower belly.  You have a fever.  You have chills.  You feeling like you will vomit or you vomit. Summary  A urinary tract infection (UTI) is an infection of any part of the urinary tract.  This condition is caused by   germs in your genital area.  There are many risk factors for a UTI.  Treatment includes antibiotic medicines.  Drink enough fluid to keep your pee pale yellow. This information is not intended to replace advice given to you by your health care provider. Make sure you discuss any questions you have with your health care provider. Document Revised: 11/20/2019 Document Reviewed: 11/20/2019 Elsevier Patient Education  2021 [employer]. Phenazopyridine tablets What is this medicine? PHENAZOPYRIDINE (fen az oh PEER i deen) is a pain reliever. It is used to stop the pain,  burning, or discomfort caused by infection or irritation of the urinary tract. This medicine is not an antibiotic. It will not cure a urinary tract infection. This medicine may be used for other purposes; ask your health care provider or pharmacist if you have questions. COMMON BRAND NAME(S): AZO, Azo-100, Azo-Gesic, Azo-Septic, Azo-Standard, Phenazo, Prodium, Pyridium, Urinary Analgesic, Uristat, Uristat Ultra What should I tell my health care provider before I take this medicine? They need to know if you have any of these conditions:  glucose-6-phosphate dehydrogenase (G6PD) deficiency  kidney disease  an unusual or allergic reaction to phenazopyridine, other medicines, foods, dyes, or preservatives  pregnant or trying to get pregnant  breast-feeding How should I use this medicine? Take this medicine by mouth with a glass of water. Follow the directions on the prescription label. Take after meals. Take your doses at regular intervals. Do not take your medicine more often than directed. Do not skip doses or stop your medicine early even if you feel better. Do not stop taking except on your doctor's advice. Talk to your pediatrician regarding the use of this medicine in children. Special care may be needed. Overdosage: If you think you have taken too much of this medicine contact a poison control center or emergency room at once. NOTE: This medicine is only for you. Do not share this medicine with others. What if I miss a dose? If you miss a dose, take it as soon as you can. If it is almost time for your next dose, take only that dose. Do not take double or extra doses. What may interact with this medicine? Interactions are not expected. This list may not describe all possible interactions. Give your health care provider a list of all the medicines, herbs, non-prescription drugs, or dietary supplements you use. Also tell them if you smoke, drink alcohol, or use illegal drugs. Some items may  interact with your medicine. What should I watch for while using this medicine? Tell your doctor or health care professional if your symptoms do not improve or if they get worse. This medicine colors body fluids red. This effect is harmless and will go away after you are done taking the medicine. It will change urine to an dark orange or red color. The red color may stain clothing. Soft contact lenses may become permanently stained. It is best not to wear soft contact lenses while taking this medicine. If you are diabetic you may get a false positive result for sugar in your urine. Talk to your health care provider. What side effects may I notice from receiving this medicine? Side effects that you should report to your doctor or health care professional as soon as possible:  allergic reactions like skin rash, itching or hives, swelling of the face, lips, or tongue  blue or purple color of the skin  difficulty breathing  fever  less urine  unusual bleeding, bruising  unusual tired, weak  vomiting  yellowing of the eyes or skin Side effects that usually do not require medical attention (report to your doctor or health care professional if they continue or are bothersome):  dark urine  headache  stomach upset This list may not describe all possible side effects. Call your doctor for medical advice about side effects. You may report side effects to FDA at 1-800-FDA-1088. Where should I keep my medicine? Keep out of the reach of children. Store at room temperature between 15 and 30 degrees C (59 and 86 degrees F). Protect from light and moisture. Throw away any unused medicine after the expiration date. NOTE: This sheet is a summary. It may not cover all possible information. If you have questions about this medicine, talk to your doctor, pharmacist, or health care provider.  2021 Elsevier/Gold Standard (2007-11-06 11:04:07) Ondansetron tablets What is this medicine? ONDANSETRON  (on DAN se tron) is used to treat nausea and vomiting caused by chemotherapy. It is also used to prevent or treat nausea and vomiting after surgery. This medicine may be used for other purposes; ask your health care provider or pharmacist if you have questions. COMMON BRAND NAME(S): Zofran What should I tell my health care provider before I take this medicine? They need to know if you have any of these conditions:  heart disease  history of irregular heartbeat  liver disease  low levels of magnesium or potassium in the blood  an unusual or allergic reaction to ondansetron, granisetron, other medicines, foods, dyes, or preservatives  pregnant or trying to get pregnant  breast-feeding How should I use this medicine? Take this medicine by mouth with a glass of water. Follow the directions on your prescription label. Take your doses at regular intervals. Do not take your medicine more often than directed. Talk to your pediatrician regarding the use of this medicine in children. Special care may be needed. Overdosage: If you think you have taken too much of this medicine contact a poison control center or emergency room at once. NOTE: This medicine is only for you. Do not share this medicine with others. What if I miss a dose? If you miss a dose, take it as soon as you can. If it is almost time for your next dose, take only that dose. Do not take double or extra doses. What may interact with this medicine? Do not take this medicine with any of the following medications:  apomorphine  certain medicines for fungal infections like fluconazole, itraconazole, ketoconazole, posaconazole, voriconazole  cisapride  dronedarone  pimozide  thioridazine This medicine may also interact with the following medications:  carbamazepine  certain medicines for depression, anxiety, or psychotic disturbances  fentanyl  linezolid  MAOIs like Carbex, Eldepryl, Marplan, Nardil, and  Parnate  methylene blue (injected into a vein)  other medicines that prolong the QT interval (cause an abnormal heart rhythm) like dofetilide, ziprasidone  phenytoin  rifampicin  tramadol This list may not describe all possible interactions. Give your health care provider a list of all the medicines, herbs, non-prescription drugs, or dietary supplements you use. Also tell them if you smoke, drink alcohol, or use illegal drugs. Some items may interact with your medicine. What should I watch for while using this medicine? Check with your doctor or health care professional right away if you have any sign of an allergic reaction. What side effects may I notice from receiving this medicine? Side effects that you should report to your doctor or health care professional as soon as possible:  allergic reactions like skin rash, itching or hives, swelling of the face, lips or tongue  breathing problems  confusion  dizziness  fast or irregular heartbeat  feeling faint or lightheaded, falls  fever and chills  loss of balance or coordination  seizures  sweating  swelling of the hands or feet  tightness in the chest  tremors  unusually weak or tired Side effects that usually do not require medical attention (report to your doctor or health care professional if they continue or are bothersome):  constipation or diarrhea  headache This list may not describe all possible side effects. Call your doctor for medical advice about side effects. You may report side effects to FDA at 1-800-FDA-1088. Where should I keep my medicine? Keep out of the reach of children. Store between 2 and 30 degrees C (36 and 86 degrees F). Throw away any unused medicine after the expiration date. NOTE: This sheet is a summary. It may not cover all possible information. If you have questions about this medicine, talk to your doctor, pharmacist, or health care provider.  2021 Elsevier/Gold Standard  (2018-04-01 07:16:43) Nitrofurantoin tablets or capsules What is this medicine? NITROFURANTOIN (nye troe fyoor AN toyn) is an antibiotic. It is used to treat urinary tract infections. This medicine may be used for other purposes; ask your health care provider or pharmacist if you have questions. COMMON BRAND NAME(S): Macrobid, Macrodantin, Urotoin What should I tell my health care provider before I take this medicine? They need to know if you have any of these conditions:  anemia  diabetes  glucose-6-phosphate dehydrogenase deficiency  kidney disease  liver disease  lung disease  other chronic illness  an unusual or allergic reaction to nitrofurantoin, other antibiotics, other medicines, foods, dyes or preservatives  pregnant or trying to get pregnant  breast-feeding How should I use this medicine? Take this medicine by mouth with a glass of water. Follow the directions on the prescription label. Take this medicine with food or milk. Take your doses at regular intervals. Do not take your medicine more often than directed. Do not stop taking except on your doctor's advice. Talk to your pediatrician regarding the use of this medicine in children. While this drug may be prescribed for selected conditions, precautions do apply. Overdosage: If you think you have taken too much of this medicine contact a poison control center or emergency room at once. NOTE: This medicine is only for you. Do not share this medicine with others. What if I miss a dose? If you miss a dose, take it as soon as you can. If it is almost time for your next dose, take only that dose. Do not take double or extra doses. What may interact with this medicine?  antacids containing magnesium trisilicate  probenecid  quinolone antibiotics like ciprofloxacin, lomefloxacin, norfloxacin and ofloxacin  sulfinpyrazone This list may not describe all possible interactions. Give your health care provider a list of all  the medicines, herbs, non-prescription drugs, or dietary supplements you use. Also tell them if you smoke, drink alcohol, or use illegal drugs. Some items may interact with your medicine. What should I watch for while using this medicine? Tell your doctor or health care professional if your symptoms do not improve or if you get new symptoms. Drink several glasses of water a day. If you are taking this medicine for a long time, visit your doctor for regular checks on your progress. If you are diabetic, you may get a false positive result  for sugar in your urine with certain brands of urine tests. Check with your doctor. What side effects may I notice from receiving this medicine? Side effects that you should report to your doctor or health care professional as soon as possible:  allergic reactions like skin rash or hives, swelling of the face, lips, or tongue  chest pain  cough  difficulty breathing  dizziness, drowsiness  fever or infection  joint aches or pains  pale or blue-tinted skin  redness, blistering, peeling or loosening of the skin, including inside the mouth  tingling, burning, pain, or numbness in hands or feet  unusual bleeding or bruising  unusually weak or tired  yellowing of eyes or skin Side effects that usually do not require medical attention (report to your doctor or health care professional if they continue or are bothersome):  dark urine  diarrhea  headache  loss of appetite  nausea or vomiting  temporary hair loss This list may not describe all possible side effects. Call your doctor for medical advice about side effects. You may report side effects to FDA at 1-800-FDA-1088. Where should I keep my medicine? Keep out of the reach of children. Store at room temperature between 15 and 30 degrees C (59 and 86 degrees F). Protect from light. Throw away any unused medicine after the expiration date. NOTE: This sheet is a summary. It may not cover all  possible information. If you have questions about this medicine, talk to your doctor, pharmacist, or health care provider.  2021 Elsevier/Gold Standard (2007-10-29 15:56:47)

## 2020-06-01 NOTE — Progress Notes (Signed)
Sent for urine culture.

## 2020-06-02 ENCOUNTER — Encounter (HOSPITAL_COMMUNITY): Payer: Self-pay | Admitting: Pharmacy Technician

## 2020-06-02 ENCOUNTER — Emergency Department (HOSPITAL_COMMUNITY): Payer: 59

## 2020-06-02 ENCOUNTER — Emergency Department (HOSPITAL_COMMUNITY)
Admission: EM | Admit: 2020-06-02 | Discharge: 2020-06-02 | Disposition: A | Discharge status: Home / Self Care | Payer: 59 | Attending: Emergency Medicine | Admitting: Emergency Medicine

## 2020-06-02 ENCOUNTER — Encounter (HOSPITAL_COMMUNITY): Payer: Self-pay | Admitting: Student

## 2020-06-02 ENCOUNTER — Other Ambulatory Visit: Payer: Self-pay

## 2020-06-02 DIAGNOSIS — D72829 Elevated white blood cell count, unspecified: Secondary | ICD-10-CM | POA: Insufficient documentation

## 2020-06-02 DIAGNOSIS — R1031 Right lower quadrant pain: Secondary | ICD-10-CM | POA: Diagnosis not present

## 2020-06-02 DIAGNOSIS — R300 Dysuria: Secondary | ICD-10-CM | POA: Insufficient documentation

## 2020-06-02 DIAGNOSIS — R509 Fever, unspecified: Secondary | ICD-10-CM | POA: Insufficient documentation

## 2020-06-02 DIAGNOSIS — R739 Hyperglycemia, unspecified: Secondary | ICD-10-CM | POA: Diagnosis not present

## 2020-06-02 DIAGNOSIS — R319 Hematuria, unspecified: Secondary | ICD-10-CM | POA: Diagnosis not present

## 2020-06-02 DIAGNOSIS — N898 Other specified noninflammatory disorders of vagina: Secondary | ICD-10-CM | POA: Insufficient documentation

## 2020-06-02 DIAGNOSIS — Z87891 Personal history of nicotine dependence: Secondary | ICD-10-CM | POA: Diagnosis not present

## 2020-06-02 DIAGNOSIS — N12 Tubulo-interstitial nephritis, not specified as acute or chronic: Secondary | ICD-10-CM

## 2020-06-02 DIAGNOSIS — Z91040 Latex allergy status: Secondary | ICD-10-CM | POA: Diagnosis not present

## 2020-06-02 LAB — CBC WITH DIFFERENTIAL/PLATELET
Abs Immature Granulocytes: 0.51 10*3/uL — ABNORMAL HIGH (ref 0.00–0.07)
Basophils Absolute: 0 10*3/uL (ref 0.0–0.1)
Basophils Relative: 0 %
Eosinophils Absolute: 0 10*3/uL (ref 0.0–0.5)
Eosinophils Relative: 0 %
HCT: 37.8 % (ref 36.0–46.0)
Hemoglobin: 12.6 g/dL (ref 12.0–15.0)
Immature Granulocytes: 2 %
Lymphocytes Relative: 3 %
Lymphs Abs: 0.6 10*3/uL — ABNORMAL LOW (ref 0.7–4.0)
MCH: 31.7 pg (ref 26.0–34.0)
MCHC: 33.3 g/dL (ref 30.0–36.0)
MCV: 95.2 fL (ref 80.0–100.0)
Monocytes Absolute: 1.9 10*3/uL — ABNORMAL HIGH (ref 0.1–1.0)
Monocytes Relative: 9 %
Neutro Abs: 18.5 10*3/uL — ABNORMAL HIGH (ref 1.7–7.7)
Neutrophils Relative %: 86 %
Platelets: 163 10*3/uL (ref 150–400)
RBC: 3.97 MIL/uL (ref 3.87–5.11)
RDW: 13 % (ref 11.5–15.5)
WBC: 21.5 10*3/uL — ABNORMAL HIGH (ref 4.0–10.5)
nRBC: 0 % (ref 0.0–0.2)

## 2020-06-02 LAB — COMPREHENSIVE METABOLIC PANEL
ALT: 15 U/L (ref 0–44)
AST: 18 U/L (ref 15–41)
Albumin: 3.3 g/dL — ABNORMAL LOW (ref 3.5–5.0)
Alkaline Phosphatase: 91 U/L (ref 38–126)
Anion gap: 9 (ref 5–15)
BUN: 8 mg/dL (ref 6–20)
CO2: 21 mmol/L — ABNORMAL LOW (ref 22–32)
Calcium: 8.4 mg/dL — ABNORMAL LOW (ref 8.9–10.3)
Chloride: 105 mmol/L (ref 98–111)
Creatinine, Ser: 0.63 mg/dL (ref 0.44–1.00)
GFR, Estimated: >60 mL/min (ref >60)
Glucose, Bld: 110 mg/dL — ABNORMAL HIGH (ref 70–99)
Potassium: 3.9 mmol/L (ref 3.5–5.1)
Sodium: 135 mmol/L (ref 135–145)
Total Bilirubin: 0.8 mg/dL (ref 0.3–1.2)
Total Protein: 6.4 g/dL — ABNORMAL LOW (ref 6.5–8.1)

## 2020-06-02 LAB — URINALYSIS, ROUTINE W REFLEX MICROSCOPIC
Bilirubin Urine: NEGATIVE
Glucose, UA: NEGATIVE mg/dL
Ketones, ur: 20 mg/dL — AB
Nitrite: NEGATIVE
Protein, ur: 30 mg/dL — AB
Specific Gravity, Urine: 1.02 (ref 1.005–1.030)
pH: 5 (ref 5.0–8.0)

## 2020-06-02 LAB — WET PREP, GENITAL
Clue Cells Wet Prep HPF POC: NONE SEEN
Sperm: NONE SEEN
Trich, Wet Prep: NONE SEEN
Yeast Wet Prep HPF POC: NONE SEEN

## 2020-06-02 LAB — LACTIC ACID, PLASMA: Lactic Acid, Venous: 1.1 mmol/L (ref 0.5–1.9)

## 2020-06-02 LAB — LIPASE, BLOOD: Lipase: 22 U/L (ref 11–51)

## 2020-06-02 LAB — POC URINE PREG, ED: Preg Test, Ur: NEGATIVE

## 2020-06-02 MED ORDER — IOHEXOL 300 MG/ML  SOLN
100.0000 mL | Freq: Once | INTRAMUSCULAR | Status: AC | PRN
  Administered 2020-06-02: 100 mL via INTRAVENOUS

## 2020-06-02 MED ORDER — AMOXICILLIN-POT CLAVULANATE 875-125 MG PO TABS: 1.0000 | ORAL_TABLET | Freq: Two times a day (BID) | ORAL | 0 refills | Status: AC

## 2020-06-02 MED ORDER — MORPHINE SULFATE (PF) 4 MG/ML IV SOLN
4.0000 mg | Freq: Once | INTRAVENOUS | Status: AC
  Administered 2020-06-02: 4 mg via INTRAVENOUS
  Filled 2020-06-02: qty 1

## 2020-06-02 MED ORDER — SODIUM CHLORIDE 0.9 % IV BOLUS
1000.0000 mL | Freq: Once | INTRAVENOUS | Status: AC
  Administered 2020-06-02: 1000 mL via INTRAVENOUS

## 2020-06-02 MED ORDER — HYDROMORPHONE HCL 1 MG/ML IJ SOLN
1.0000 mg | Freq: Once | INTRAMUSCULAR | Status: AC
  Administered 2020-06-02: 1 mg via INTRAVENOUS
  Filled 2020-06-02: qty 1

## 2020-06-02 MED ORDER — KETOROLAC TROMETHAMINE 15 MG/ML IJ SOLN
15.0000 mg | Freq: Once | INTRAMUSCULAR | Status: AC
  Administered 2020-06-02: 15 mg via INTRAVENOUS
  Filled 2020-06-02: qty 1

## 2020-06-02 MED ORDER — SODIUM CHLORIDE 0.9 % IV SOLN
1.0000 g | Freq: Once | INTRAVENOUS | Status: AC
  Administered 2020-06-02: 1 g via INTRAVENOUS
  Filled 2020-06-02: qty 10

## 2020-06-02 MED ORDER — ACETAMINOPHEN 500 MG PO TABS
1000.0000 mg | ORAL_TABLET | Freq: Once | ORAL | Status: AC
  Administered 2020-06-02: 1000 mg via ORAL
  Filled 2020-06-02: qty 2

## 2020-06-02 MED ORDER — HYDROMORPHONE HCL 1 MG/ML IJ SOLN
0.5000 mg | Freq: Once | INTRAMUSCULAR | Status: AC
  Administered 2020-06-02: 0.5 mg via INTRAVENOUS
  Filled 2020-06-02: qty 1

## 2020-06-02 NOTE — ED Provider Notes (Signed)
  Physical Exam  BP 98/75   Pulse (!) 109   Temp 98.9 F (37.2 C) (Oral)   Resp 16   LMP 05/25/2020 (LMP Unknown)   SpO2 93%   Physical Exam Vitals and nursing note reviewed.  Constitutional:      General: She is not in acute distress.    Appearance: Normal appearance. She is well-developed, normal weight and well-nourished.  HENT:     Head: Normocephalic and atraumatic.     Right Ear: External ear normal.     Left Ear: External ear normal.     Nose: Nose normal.     Mouth/Throat:     Mouth: Mucous membranes are moist.     Pharynx: Oropharynx is clear. No oropharyngeal exudate or posterior oropharyngeal erythema.  Eyes:     General: No scleral icterus.       Right eye: No discharge.        Left eye: No discharge.     Conjunctiva/sclera: Conjunctivae normal.  Cardiovascular:     Rate and Rhythm: Normal rate and regular rhythm.     Heart sounds: No murmur heard.   Pulmonary:     Effort: Pulmonary effort is normal. No respiratory distress.     Breath sounds: Normal breath sounds. No wheezing, rhonchi or rales.  Abdominal:     General: There is no distension.     Palpations: Abdomen is soft.     Tenderness: There is no abdominal tenderness. There is right CVA tenderness. There is no left CVA tenderness, guarding or rebound.  Musculoskeletal:        General: No tenderness or edema.     Cervical back: Neck supple.     Right lower leg: No edema.     Left lower leg: No edema.  Skin:    General: Skin is warm and dry.     Findings: No rash.  Neurological:     General: No focal deficit present.     Mental Status: She is alert and oriented to person, place, and time.  Psychiatric:        Mood and Affect: Mood and affect and mood normal.        Behavior: Behavior normal.     ED Course/Procedures     Procedures None MDM  Please refer to previous treatment team's note for full H&P. Briefly, patient is a 30yoF with history of previous UTIs secondary to ureteral reflux on  the right who presents to the ED for RLQ abdominal pain and fever. CT abdomen consistent with R pyelonephritis. Remainder of diagnostic workup reassuring. Dose of Rocephin given in ED. On reassessment, patient states she feels significantly better and her pain has resolved. She is resting comfortably and non-toxic appearing. Shared decision making with patient regarding being admitted or discharge home with PO antibiotics and close PCP follow up. Patient wishes to go home at this time and return for new or worsening symptoms. Instructed patient to continue taking Macrobid until completion and will add on Augmentin. Strict return precautions provided and discussed. Questions and concerns addressed. Patient verbalized understanding and amenable with discharge plan. Patient in stable condition at time of admission.       Christy Gentles, MD 06/03/20 [id number]    Lajean Saver, MD 06/03/20 [id number]

## 2020-06-02 NOTE — ED Provider Notes (Signed)
[facility] EMERGENCY DEPARTMENT Provider Note   CSN: [id number] Arrival date & time: 06/02/20  1332     History Chief Complaint  Patient presents with  . Abdominal Pain    AMARIAH KIERSTEAD is a 30 y.o. female with a past medical history significant for ADHD, anxiety, depression, ureteral reflux on the right, recurrent UTIs who presents to the ED due to right lower quadrant abdominal pain x1 week associated with intermittent fever.  Right lower quadrant abdominal pain radiates to the right flank and right back region.  Patient describes pain as a stabbing and shooting sensation.  Patient states she has had a fever for the past 2 days.  T-max 101.8 last night.  She states she has been alternating between ibuprofen and Tylenol.  Chart reviewed.  Patient was seen at [facility] yesterday and diagnosed with acute cystitis and discharged with Macrobid.  She is taken to doses.  He is followed by urology in [place] Dr. Johnnette Barrios.  No prophylactic antibiotics.  Patient denies vaginal symptoms.  She is currently sexually active with one partner with no concern for STDs.  Admits to dysuria and hematuria.  Last menstrual cycle 05/25/2020.  Denies chest pain, shortness of breath, nausea, vomiting.  Patient admits. to one episode of nonbloody diarrhea yesterday.  No previous abdominal operations.  Denies sick contacts known Covid exposures   History obtained from patient and past medical records. No interpreter used during encounter.      Past Medical History:  Diagnosis Date  . ADHD   . Anxiety and depression   . Depression   . Migraine   . Recurrent UTI   . Ureteral reflux 2005   Right side    Patient Active Problem List   Diagnosis Date Noted  . SVT (supraventricular tachycardia) ([place]) 06/01/2020  . Cystitis 06/01/2020  . RLQ abdominal pain 06/01/2020  . Anxiety and depression 09/18/2018  . ADHD 05/02/2018  . Migraine 09/26/2017  . History of ovarian  cyst 04/25/2016  . Recurrent UTI 06/02/2015  . Urinary tract infection without hematuria 03/01/2015  . Vesicoureteral-reflux, unspecified 03/01/2015    Past Surgical History:  Procedure Laterality Date  . CERVICAL BIOPSY  W/ LOOP ELECTRODE EXCISION    . WISDOM TOOTH EXTRACTION  06/2016     OB History    Gravida  0   Para  0   Term  0   Preterm  0   AB  0   Living  0     SAB  0   IAB  0   Ectopic  0   Multiple  0   Live Births              Family History  Problem Relation Age of Onset  . Osteoporosis Mother   . Diabetes Father   . Hypertension Father   . Healthy Brother   . ADD / ADHD Brother   . Ovarian cancer Maternal Grandmother   . Prostate cancer Maternal Grandfather   . Congestive Heart Failure Paternal Grandmother   . CVA Paternal Grandmother   . Lung cancer Paternal Grandfather     Social History   Tobacco Use  . Smoking status: Former Smoker    Packs/day: 0.00  . Smokeless tobacco: Never Used  Vaping Use  . Vaping Use: Former  Substance Use Topics  . Alcohol use: Yes    Comment: Rarely  . Drug use: No    Home Medications Prior to Admission medications  Medication Sig Start Date End Date Taking? Authorizing Provider  amphetamine-dextroamphetamine (ADDERALL XR) 20 MG 24 hr capsule Take 1 capsule (20 mg total) by mouth daily. 03/22/20   Trinna Post, PA-C  amphetamine-dextroamphetamine (ADDERALL XR) 20 MG 24 hr capsule Take 1 capsule (20 mg total) by mouth daily. 03/22/20   Trinna Post, PA-C  amphetamine-dextroamphetamine (ADDERALL XR) 20 MG 24 hr capsule Take 1 capsule (20 mg total) by mouth every morning. 03/22/20   Trinna Post, PA-C  amphetamine-dextroamphetamine (ADDERALL) 20 MG tablet Take 1 tablet (20 mg total) by mouth 2 (two) times daily. 03/22/20   Trinna Post, PA-C  fluconazole (DIFLUCAN) 150 MG tablet Take 1 tablet (150 mg total) by mouth as directed. Take one tablet by mouth on day 1. May repeat dose  of one tablet by mouth on day four. 06/01/20   Flinchum, Kelby Aline, FNP  nitrofurantoin, macrocrystal-monohydrate, (MACROBID) 100 MG capsule Take 1 capsule (100 mg total) by mouth 2 (two) times daily for 7 days. 06/01/20 06/08/20  Flinchum, Kelby Aline, FNP  norethindrone (CAMILA) 0.35 MG tablet Take on pill daily. 03/22/20   Trinna Post, PA-C  ondansetron (ZOFRAN) 4 MG tablet Take 1 tablet (4 mg total) by mouth every 8 (eight) hours as needed for nausea or vomiting. 06/01/20   Flinchum, Kelby Aline, FNP  phenazopyridine (PYRIDIUM) 95 MG tablet Take 1 tablet (95 mg total) by mouth 3 (three) times daily as needed for pain (not for more than three days). 06/01/20   Flinchum, Kelby Aline, FNP  propranolol (INDERAL) 40 MG tablet Take one pill twice daily. 03/22/20   Trinna Post, PA-C  rizatriptan (MAXALT) 10 MG tablet Take 1 tablet (10 mg total) by mouth as needed for migraine. May repeat in 2 hours if needed 03/22/20   Trinna Post, PA-C  sertraline (ZOLOFT) 100 MG tablet Take 1 tablet (100 mg total) by mouth daily. 03/22/20   Trinna Post, PA-C  traZODone (DESYREL) 50 MG tablet Take 0.5-1 tablets (25-50 mg total) by mouth at bedtime as needed for sleep. 03/22/20   Trinna Post, PA-C    Allergies    Bee venom and Latex  Review of Systems   Review of Systems  Constitutional: Positive for chills and fever.  Gastrointestinal: Positive for abdominal pain and diarrhea. Negative for nausea and vomiting.  Genitourinary: Positive for dysuria and flank pain. Negative for vaginal discharge.  Musculoskeletal: Positive for back pain.  All other systems reviewed and are negative.   Physical Exam Updated Vital Signs BP 108/78 (BP Location: Right Arm)   Pulse 96   Temp 99.6 F (37.6 C) (Oral)   Resp 17   LMP 05/25/2020 (LMP Unknown)   SpO2 99%   Physical Exam Vitals and nursing note reviewed.  Constitutional:      General: She is not in acute distress. HENT:     Head:  Normocephalic.  Eyes:     Pupils: Pupils are equal, round, and reactive to light.  Cardiovascular:     Rate and Rhythm: Normal rate and regular rhythm.     Pulses: Normal pulses.     Heart sounds: Normal heart sounds. No murmur heard. No friction rub. No gallop.   Pulmonary:     Effort: Pulmonary effort is normal.     Breath sounds: Normal breath sounds.  Abdominal:     General: Abdomen is flat. Bowel sounds are normal. There is no distension.     Palpations: Abdomen is soft.  Tenderness: There is abdominal tenderness. There is guarding. There is no rebound.     Comments: RLQ and suprapubic tenderness. + ROVSING. +right CVA tenderness.  Genitourinary:    Comments: Pelvic exam performed with chaperone in room.  Mild amount of white discharge.  No CMT.  Mild bilateral adnexal tenderness.  No masses appreciated on exam. Musculoskeletal:     Cervical back: Neck supple.     Comments: Able to move all 4 extremities without difficulty.  Skin:    General: Skin is warm and dry.  Neurological:     General: No focal deficit present.  Psychiatric:        Mood and Affect: Mood normal.        Behavior: Behavior normal.     ED Results / Procedures / Treatments   Labs (all labs ordered are listed, but only abnormal results are displayed) Labs Reviewed  CBC WITH DIFFERENTIAL/PLATELET - Abnormal; Notable for the following components:      Result Value   WBC 21.5 (*)    Neutro Abs 18.5 (*)    Lymphs Abs 0.6 (*)    Monocytes Absolute 1.9 (*)    Abs Immature Granulocytes 0.51 (*)    All other components within normal limits  COMPREHENSIVE METABOLIC PANEL - Abnormal; Notable for the following components:   CO2 21 (*)    Glucose, Bld 110 (*)    Calcium 8.4 (*)    Total Protein 6.4 (*)    Albumin 3.3 (*)    All other components within normal limits  WET PREP, GENITAL  LIPASE, BLOOD  URINALYSIS, ROUTINE W REFLEX MICROSCOPIC  LACTIC ACID, PLASMA  LACTIC ACID, PLASMA  I-STAT BETA HCG  BLOOD, ED ([facility], [facility], [facility] ONLY)  GC/CHLAMYDIA PROBE AMP ([place]) NOT AT [facility]    EKG None  Radiology No results found.  Procedures Procedures   Medications Ordered in ED Medications  sodium chloride 0.9 % bolus 1,000 mL (has no administration in time range)    ED Course  I have reviewed the triage vital signs and the nursing notes.  Pertinent labs & imaging results that were available during my care of the patient were reviewed by me and considered in my medical decision making (see chart for details).    MDM Rules/Calculators/A&P                         30 year old female presents to the ED due to right lower quadrant abdominal pain x1 week associated with intermittent fever.  History of chronic UTIs.  Patient diagnosed with acute cystitis yesterday and discharged with Macrobid which she has been compliant with.  Upon arrival, vitals all within normal limits.  Patient is afebrile, not tachycardic or hypoxic.  Low suspicion for sepsis.  Patient in no acute distress and nontoxic-appearing.  Physical exam significant for right lower quadrant and suprapubic tenderness.  Positive right CVA tenderness.  Positive Rovsing sign. DDx: Pyelonephritis, appendicitis, ovarian cyst.  Routine labs ordered to rule out infectious etiology and electrolyte abnormalities.  CT abdomen to rule out appendicitis and pyelonephritis. Will perform pelvic to rule out pelvic etiology. IVFs given.  Patient given fentanyl prior to arrival.  Pelvic exam performed with chaperone in room.  Mild white discharge.  No CMT.  Doubt PID.  Mild bilateral adnexal tenderness.  No masses appreciated on exam.  CBC significant for leukocytosis at 21.5.  CMP significant for hyperglycemia at 110 with no anion gap.  Doubt DKA.  Lipase normal at 22.  Doubt pancreatitis.   Patient handed off to Dr. Lanny Hurst pending CT abdomen. If normal, low threshold for pelvic US.  Final Clinical Impression(s) / ED Diagnoses Final diagnoses:  Right  lower quadrant abdominal pain    Rx / DC Orders ED Discharge Orders    None       Karie Kirks 06/02/20 1559    Wyvonnia Dusky, MD 06/02/20 [phone]

## 2020-06-02 NOTE — ED Triage Notes (Signed)
Pt bib ems with RLQ abd pain with fevers. Pt went to UC yesterday and started on macrobid. Pt with worsening pain. Unable to control fever with OTC meds. Hx ovarian cysts. Given 150mcg fentanyl en route.  16g LAC.

## 2020-06-03 ENCOUNTER — Telehealth: Payer: Self-pay | Admitting: *Deleted

## 2020-06-03 ENCOUNTER — Ambulatory Visit: Payer: Self-pay | Admitting: *Deleted

## 2020-06-03 LAB — URINE CULTURE

## 2020-06-03 LAB — GC/CHLAMYDIA PROBE AMP (~~LOC~~) NOT AT ARMC
Chlamydia: NEGATIVE
Comment: NEGATIVE
Comment: NORMAL
Neisseria Gonorrhea: NEGATIVE

## 2020-06-03 NOTE — Telephone Encounter (Signed)
Do not recommend taking more than as directed on Zofran, as do not want to mask any symptoms, in the emergency room her WBC was very high, if she is remaining very nauseated or any worsening symptoms I advise her to go back to the Emergency room for evaluation and possible IV antibiotics and anti-nausea medications.

## 2020-06-03 NOTE — Telephone Encounter (Signed)
   PT mother is calling to ask if ondansetron (ZOFRAN) 4 MG to be taken can this be taken less than every 8 hours   Patient called and asking if she can take zofran 4 mg sooner than 8 hours. Patient reports times when her symptoms start back around 6 hours after taking zofran. Please advise . Instructed patient to follow dose and times per PCP orders. Care advise given. Patient verbalized understanding of care advise and to call back if symptoms worsen. Patient is requesting a call back to her mother , Horris Latino, at [phone] in case she misses the call and or send advise via My Chart message.  Reason for Disposition . [1] Caller requesting NON-URGENT health information AND [2] PCP's office is the best resource  Answer Assessment - Initial Assessment Questions 1. REASON FOR CALL or QUESTION: "What is your reason for calling today?" or "How can I best help you?" or "What question do you have that I can help answer?"     Patient is asking if it is ok to take zofran sooner than every 8 hours due to symptoms noted prior to 8 hours.  Protocols used: INFORMATION ONLY CALL - NO TRIAGE-A-AH

## 2020-06-03 NOTE — Telephone Encounter (Signed)
Patient was seen in ED yesterday and prescribed zofran and they are the ones who performed Urine Culture. If patient or mother returns call, please have triage nurse speak with them to find out more information and triage if worsening symptoms. KW

## 2020-06-03 NOTE — Telephone Encounter (Signed)
Spoke with patients mother and advised as below, she states that patient only had one episode of bad nausea this morning shortly after taking Augmentin. Patients mother states that patient was able to eat a regular meal at lunch with no complains of nausea or vomiting. She is aware if worsening symptoms to return back to ED, she will monitor patients symptoms over the weekend and send [place] update through mychart/call if there has been any new changes. KW

## 2020-06-03 NOTE — Telephone Encounter (Signed)
Component 2 d ago  Urine Culture, Routine Preliminary report Abnormal  P   Organism ID, Bacteria Escherichia coli Abnormal  P   Comment: 25,000-50,000 colony forming units per mL  Resulting Agency [facility]        Narrative Performed by: Maryan Puls Performed at:  [facility]  [address] [place], [place]  [id number]  Lab Director: Rush Farmer MD, Phone:  [phone]    Specimen Collected: 06/01/20 11:19 Last Resulted: 06/03/20 07:38     Lab Flowsheet   Order Details   View Encounter   Lab and Collection Details   Routing   Result History      P=Value has a preliminary status       Result Care Coordination   Result Notes   Doreen Beam, FNP  06/03/2020  3:01 PM EST Back to Top     E coli - culture has not finalized yet is still in preliminary status. No final culture as of 06/03/20 3pm. Continue antibiotics prescribed in ED. Will result culture once final. Return to emergency room if any symptoms worsening at anytime.    Patient Communication  Not Released Not seen Back to Top        Other Results from 06/01/2020    Contains abnormal data POCT urinalysis dipstick Order: [id number] Status: Final result   Visible to patient: Yes (not seen)   Next appt: 07/18/2020 at 03:40 PM in Family Medicine ([facility] [place], [place])   Dx: Cystitis   2 Result Notes   2 Patient Communications      (important suggestion)  Newer results are available. Click to view them now.   Component Ref Range & Units 2 d ago 3 yr ago 4 yr ago 5 yr ago  Color, UA  yellow  Yellow R     Clarity, UA  clear      Glucose, UA Negative Negative      Bilirubin, UA  small  Negative R     Ketones, UA  negative  Negative R     Spec Grav, UA 1.010 - 1.025 1.020  1.025 R     Blood, UA  moderate  Trace Abnormal  R     pH, UA 5.0 - 8.0 6.0  6.0 R     Protein, UA Negative Positive Abnormal   Negative R     Urobilinogen, UA 0.2 or 1.0 E.U./dL 0.2      Nitrite, UA  negative  Negative R      Leukocytes, UA Negative Negative  Negative  SMALL Abnormal  R  NEGATIVE R   Appearance    TURBID Abnormal  R  HAZY Abnormal  R   Odor       Resulting Agency   [facility]         Specimen Collected: 06/01/20 14:36 Last Resulted: 06/01/20 14:36     Lab Flowsheet   Order Details   View Encounter   Lab and Collection Details   Routing   Result History      R=Reference range differs from displayed range       Result Care Coordination   Result Notes   Doreen Beam, FNP  06/03/2020  3:01 PM EST Back to Top     E coli - culture has not finalized yet is still in preliminary status. No final culture as of 06/03/20 3pm. Continue antibiotics prescribed in ED. Will result culture once final. Return to emergency room  if any symptoms worsening at anytime.    Doreen Beam, FNP  06/01/2020  2:50 PM EST      Sent for urine culture.      Patient Communication  Edit Comments  Edit Notifications  Back to Top    E coli - culture has not finalized yet is still in preliminary status. No final culture as of 06/03/20 3pm. Continue antibiotics prescribed in ED. Will result culture once final. Return to emergency room if any symptoms worsening at anytime.  Written by Doreen Beam, FNP on 06/03/2020  3:01 PM EST View Past Comments       Ordering Comments  Not on file   MyChart Results Release  MyChart Status: Active  Results Release    Urine Culture: Result Notes   Doreen Beam, FNP  06/03/2020  3:01 PM EST      E coli - culture has not finalized yet is still in preliminary status. No final culture as of 06/03/20 3pm. Continue antibiotics prescribed in ED. Will result culture once final. Return to emergency room if any symptoms worsening at anytime.         Order Questions         Urine Culture: Patient Communication  Not Released Not seen    Encounter  View Encounter         Result Information  Flag: Abnormal  Abnormal   Status: Preliminary result (Collected: 06/01/2020 11:19) Provider Status: Ordered     Lab Information  [facility]        Order-Level Documents:  There are no order-level documents.  View SmartLink Info  Urine Culture (Order #[id number]) on 06/01/20   Result Read / Acknowledged  No acknowledgement history exists for this order.   Urine Culture: Patient Communication  Not Released Not seen

## 2020-06-03 NOTE — Telephone Encounter (Signed)
Patients mother returned call and spoke with triage see other telephone encounter. KW

## 2020-06-03 NOTE — Progress Notes (Signed)
E coli - culture has not finalized yet is still in preliminary status. No final culture as of 06/03/20 3pm. Continue antibiotics prescribed in ED. Will result culture once final. Return to emergency room if any symptoms worsening at anytime.

## 2020-06-03 NOTE — Telephone Encounter (Signed)
Copied from [place] [id number]. Topic: General - Other >> Jun 03, 2020 10:02 AM Eileen Davis A wrote: Reason for CRM: Patient would like to be contacted regarding a urine culture submitted Wednesday 06/01/20 Patient has a prescription for ondansetron (ZOFRAN) 4 MG to be taken every 8 hours and would like to discuss increasing the frequency Patient's mother is present and would like to be included in discussions with patient

## 2020-06-04 ENCOUNTER — Emergency Department (HOSPITAL_COMMUNITY)
Admission: EM | Admit: 2020-06-04 | Discharge: 2020-06-04 | Disposition: A | Discharge status: Home / Self Care | Payer: 59 | Attending: Emergency Medicine | Admitting: Emergency Medicine

## 2020-06-04 ENCOUNTER — Encounter (HOSPITAL_COMMUNITY): Payer: Self-pay | Admitting: Emergency Medicine

## 2020-06-04 ENCOUNTER — Other Ambulatory Visit: Payer: Self-pay

## 2020-06-04 DIAGNOSIS — N10 Acute tubulo-interstitial nephritis: Secondary | ICD-10-CM | POA: Insufficient documentation

## 2020-06-04 DIAGNOSIS — Z87891 Personal history of nicotine dependence: Secondary | ICD-10-CM | POA: Diagnosis not present

## 2020-06-04 DIAGNOSIS — R109 Unspecified abdominal pain: Secondary | ICD-10-CM | POA: Diagnosis present

## 2020-06-04 DIAGNOSIS — Z91040 Latex allergy status: Secondary | ICD-10-CM | POA: Insufficient documentation

## 2020-06-04 DIAGNOSIS — N12 Tubulo-interstitial nephritis, not specified as acute or chronic: Secondary | ICD-10-CM

## 2020-06-04 LAB — URINALYSIS, ROUTINE W REFLEX MICROSCOPIC
Bilirubin Urine: NEGATIVE
Glucose, UA: NEGATIVE mg/dL
Hgb urine dipstick: NEGATIVE
Ketones, ur: NEGATIVE mg/dL
Leukocytes,Ua: NEGATIVE
Nitrite: NEGATIVE
Protein, ur: NEGATIVE mg/dL
Specific Gravity, Urine: 1.003 — ABNORMAL LOW (ref 1.005–1.030)
pH: 9 — ABNORMAL HIGH (ref 5.0–8.0)

## 2020-06-04 LAB — CBC
HCT: 32.9 % — ABNORMAL LOW (ref 36.0–46.0)
Hemoglobin: 11.1 g/dL — ABNORMAL LOW (ref 12.0–15.0)
MCH: 31.4 pg (ref 26.0–34.0)
MCHC: 33.7 g/dL (ref 30.0–36.0)
MCV: 92.9 fL (ref 80.0–100.0)
Platelets: 213 10*3/uL (ref 150–400)
RBC: 3.54 MIL/uL — ABNORMAL LOW (ref 3.87–5.11)
RDW: 13.3 % (ref 11.5–15.5)
WBC: 18.8 10*3/uL — ABNORMAL HIGH (ref 4.0–10.5)
nRBC: 0 % (ref 0.0–0.2)

## 2020-06-04 LAB — COMPREHENSIVE METABOLIC PANEL
ALT: 54 U/L — ABNORMAL HIGH (ref 0–44)
AST: 46 U/L — ABNORMAL HIGH (ref 15–41)
Albumin: 2.9 g/dL — ABNORMAL LOW (ref 3.5–5.0)
Alkaline Phosphatase: 123 U/L (ref 38–126)
Anion gap: 9 (ref 5–15)
BUN: <5 mg/dL — ABNORMAL LOW (ref 6–20)
CO2: 23 mmol/L (ref 22–32)
Calcium: 8.5 mg/dL — ABNORMAL LOW (ref 8.9–10.3)
Chloride: 106 mmol/L (ref 98–111)
Creatinine, Ser: 0.58 mg/dL (ref 0.44–1.00)
GFR, Estimated: >60 mL/min (ref >60)
Glucose, Bld: 103 mg/dL — ABNORMAL HIGH (ref 70–99)
Potassium: 3.7 mmol/L (ref 3.5–5.1)
Sodium: 138 mmol/L (ref 135–145)
Total Bilirubin: 0.9 mg/dL (ref 0.3–1.2)
Total Protein: 6.4 g/dL — ABNORMAL LOW (ref 6.5–8.1)

## 2020-06-04 LAB — URINE CULTURE: Culture: NO GROWTH

## 2020-06-04 LAB — LIPASE, BLOOD: Lipase: 20 U/L (ref 11–51)

## 2020-06-04 LAB — I-STAT BETA HCG BLOOD, ED (MC, WL, AP ONLY): I-stat hCG, quantitative: 36 m[IU]/mL — ABNORMAL HIGH (ref <5)

## 2020-06-04 LAB — POC URINE PREG, ED: Preg Test, Ur: NEGATIVE

## 2020-06-04 MED ORDER — PROMETHAZINE HCL 25 MG/ML IJ SOLN
25.0000 mg | Freq: Once | INTRAMUSCULAR | Status: AC
  Administered 2020-06-04: 25 mg via INTRAVENOUS
  Filled 2020-06-04: qty 1

## 2020-06-04 MED ORDER — PROMETHAZINE HCL 25 MG PO TABS: 25.0000 mg | ORAL_TABLET | Freq: Four times a day (QID) | ORAL | 0 refills | Status: DC | PRN

## 2020-06-04 MED ORDER — SODIUM CHLORIDE 0.9 % IV BOLUS
1000.0000 mL | Freq: Once | INTRAVENOUS | Status: AC
  Administered 2020-06-04: 1000 mL via INTRAVENOUS

## 2020-06-04 MED ORDER — IBUPROFEN 400 MG PO TABS
600.0000 mg | ORAL_TABLET | Freq: Once | ORAL | Status: AC
  Administered 2020-06-04: 600 mg via ORAL
  Filled 2020-06-04: qty 1

## 2020-06-04 MED ORDER — SODIUM CHLORIDE 0.9 % IV SOLN
1.0000 g | Freq: Once | INTRAVENOUS | Status: AC
  Administered 2020-06-04: 1 g via INTRAVENOUS
  Filled 2020-06-04: qty 10

## 2020-06-04 NOTE — ED Provider Notes (Signed)
[facility] EMERGENCY DEPARTMENT Provider Note   CSN: [id number] Arrival date & time: 06/04/20  7564     History Chief Complaint  Patient presents with  . Abdominal Pain    Eileen Davis is a 30 y.o. female.  Presents to ER with concern for ongoing abdominal pain nausea and fever.  Patient states she started having urinary symptoms on 2/9.  Family doctor diagnosed with UTI, sent urine culture and started on nitrofurantoin.  Seen in ER for abdominal pain, fever on 2/10.  Diagnosed with pyelonephritis.  CT findings consistent with [place].  Given IV Rocephin, IV fluids.  Patient's symptoms had improved and she was discharged home.  Patient states since discharge she has had ongoing nausea as well as fevers.  Had a fever this morning, took Tylenol.  Has had significant ongoing nausea.  Some relief with Zofran.  Has been taking antibiotics.  No vomiting.  Pain is currently minimal.  Primary concern is ongoing fever and nausea.  Last menstrual cycle was 1 week ago.  HPI     Past Medical History:  Diagnosis Date  . ADHD   . Anxiety and depression   . Depression   . Migraine   . Recurrent UTI   . Ureteral reflux 2005   Right side    Patient Active Problem List   Diagnosis Date Noted  . SVT (supraventricular tachycardia) ([place]) 06/01/2020  . Cystitis 06/01/2020  . RLQ abdominal pain 06/01/2020  . Anxiety and depression 09/18/2018  . ADHD 05/02/2018  . Migraine 09/26/2017  . History of ovarian cyst 04/25/2016  . Recurrent UTI 06/02/2015  . Urinary tract infection without hematuria 03/01/2015  . Vesicoureteral-reflux, unspecified 03/01/2015    Past Surgical History:  Procedure Laterality Date  . CERVICAL BIOPSY  W/ LOOP ELECTRODE EXCISION    . WISDOM TOOTH EXTRACTION  06/2016     OB History    Gravida  0   Para  0   Term  0   Preterm  0   AB  0   Living  0     SAB  0   IAB  0   Ectopic  0   Multiple  0   Live Births               Family History  Problem Relation Age of Onset  . Osteoporosis Mother   . Diabetes Father   . Hypertension Father   . Healthy Brother   . ADD / ADHD Brother   . Ovarian cancer Maternal Grandmother   . Prostate cancer Maternal Grandfather   . Congestive Heart Failure Paternal Grandmother   . CVA Paternal Grandmother   . Lung cancer Paternal Grandfather     Social History   Tobacco Use  . Smoking status: Former Smoker    Packs/day: 0.00  . Smokeless tobacco: Never Used  Vaping Use  . Vaping Use: Former  Substance Use Topics  . Alcohol use: Yes    Comment: Rarely  . Drug use: No    Home Medications Prior to Admission medications   Medication Sig Start Date End Date Taking? Authorizing Provider  amoxicillin-clavulanate (AUGMENTIN) 875-125 MG tablet Take 1 tablet by mouth every 12 (twelve) hours for 10 days. 06/02/20 06/12/20  Christy Gentles, MD  amphetamine-dextroamphetamine (ADDERALL XR) 20 MG 24 hr capsule Take 1 capsule (20 mg total) by mouth daily. 03/22/20   Trinna Post, PA-C  amphetamine-dextroamphetamine (ADDERALL XR) 20 MG 24 hr capsule Take 1 capsule (  20 mg total) by mouth daily. 03/22/20   Trinna Post, PA-C  amphetamine-dextroamphetamine (ADDERALL XR) 20 MG 24 hr capsule Take 1 capsule (20 mg total) by mouth every morning. 03/22/20   Trinna Post, PA-C  amphetamine-dextroamphetamine (ADDERALL) 20 MG tablet Take 1 tablet (20 mg total) by mouth 2 (two) times daily. 03/22/20   Trinna Post, PA-C  fluconazole (DIFLUCAN) 150 MG tablet Take 1 tablet (150 mg total) by mouth as directed. Take one tablet by mouth on day 1. May repeat dose of one tablet by mouth on day four. 06/01/20   Flinchum, Kelby Aline, FNP  nitrofurantoin, macrocrystal-monohydrate, (MACROBID) 100 MG capsule Take 1 capsule (100 mg total) by mouth 2 (two) times daily for 7 days. 06/01/20 06/08/20  Flinchum, Kelby Aline, FNP  norethindrone (CAMILA) 0.35 MG tablet Take on pill daily. 03/22/20    Trinna Post, PA-C  ondansetron (ZOFRAN) 4 MG tablet Take 1 tablet (4 mg total) by mouth every 8 (eight) hours as needed for nausea or vomiting. 06/01/20   Flinchum, Kelby Aline, FNP  phenazopyridine (PYRIDIUM) 95 MG tablet Take 1 tablet (95 mg total) by mouth 3 (three) times daily as needed for pain (not for more than three days). 06/01/20   Flinchum, Kelby Aline, FNP  promethazine (PHENERGAN) 25 MG tablet Take 1 tablet (25 mg total) by mouth every 6 (six) hours as needed for nausea or vomiting. 06/04/20   Lucrezia Starch, MD  propranolol (INDERAL) 40 MG tablet Take one pill twice daily. 03/22/20   Trinna Post, PA-C  rizatriptan (MAXALT) 10 MG tablet Take 1 tablet (10 mg total) by mouth as needed for migraine. May repeat in 2 hours if needed 03/22/20   Trinna Post, PA-C  sertraline (ZOLOFT) 100 MG tablet Take 1 tablet (100 mg total) by mouth daily. 03/22/20   Trinna Post, PA-C  traZODone (DESYREL) 50 MG tablet Take 0.5-1 tablets (25-50 mg total) by mouth at bedtime as needed for sleep. 03/22/20   Trinna Post, PA-C    Allergies    Bee venom and Latex  Review of Systems   Review of Systems  Constitutional: Positive for chills and fever.  HENT: Negative for ear pain and sore throat.   Eyes: Negative for pain and visual disturbance.  Respiratory: Negative for cough and shortness of breath.   Cardiovascular: Negative for chest pain and palpitations.  Gastrointestinal: Positive for abdominal pain and nausea. Negative for vomiting.  Genitourinary: Positive for flank pain. Negative for dysuria and hematuria.  Musculoskeletal: Negative for arthralgias and back pain.  Skin: Negative for color change and rash.  Neurological: Negative for seizures and syncope.  All other systems reviewed and are negative.   Physical Exam Updated Vital Signs BP 111/80   Pulse 99   Temp 98.9 F (37.2 C) (Oral)   Resp (!) 26   LMP 05/25/2020 (LMP Unknown)   SpO2 96%   Physical  Exam Vitals and nursing note reviewed.  Constitutional:      General: She is not in acute distress.    Appearance: She is well-developed and well-nourished.  HENT:     Head: Normocephalic and atraumatic.  Eyes:     Conjunctiva/sclera: Conjunctivae normal.  Cardiovascular:     Rate and Rhythm: Normal rate and regular rhythm.     Heart sounds: No murmur heard.   Pulmonary:     Effort: Pulmonary effort is normal. No respiratory distress.     Breath sounds: Normal breath sounds.  Abdominal:     General: Abdomen is flat.     Palpations: Abdomen is soft.     Tenderness: There is no abdominal tenderness.     Comments: R flank has some ttp, and mild CVA tenderness but no signficant ttp in abdomen  Musculoskeletal:        General: No edema.     Cervical back: Neck supple.  Skin:    General: Skin is warm and dry.  Neurological:     Mental Status: She is alert.  Psychiatric:        Mood and Affect: Mood and affect normal.     ED Results / Procedures / Treatments   Labs (all labs ordered are listed, but only abnormal results are displayed) Labs Reviewed  COMPREHENSIVE METABOLIC PANEL - Abnormal; Notable for the following components:      Result Value   Glucose, Bld 103 (*)    BUN <5 (*)    Calcium 8.5 (*)    Total Protein 6.4 (*)    Albumin 2.9 (*)    AST 46 (*)    ALT 54 (*)    All other components within normal limits  CBC - Abnormal; Notable for the following components:   WBC 18.8 (*)    RBC 3.54 (*)    Hemoglobin 11.1 (*)    HCT 32.9 (*)    All other components within normal limits  URINALYSIS, ROUTINE W REFLEX MICROSCOPIC - Abnormal; Notable for the following components:   Specific Gravity, Urine 1.003 (*)    pH 9.0 (*)    All other components within normal limits  I-STAT BETA HCG BLOOD, ED ([facility], [facility], [facility] ONLY) - Abnormal; Notable for the following components:   I-stat hCG, quantitative 36.0 (*)    All other components within normal limits  LIPASE, BLOOD  HCG,  SERUM, QUALITATIVE  POC URINE PREG, ED    EKG None  Radiology CT ABDOMEN PELVIS W CONTRAST  Result Date: 06/02/2020 CLINICAL DATA:  30 year old female with right lower quadrant abdominal pain. EXAM: CT ABDOMEN AND PELVIS WITH CONTRAST TECHNIQUE: Multidetector CT imaging of the abdomen and pelvis was performed using the standard protocol following bolus administration of intravenous contrast. CONTRAST:  132mL OMNIPAQUE IOHEXOL 300 MG/ML  SOLN COMPARISON:  CT abdomen pelvis dated 12/06/2015. FINDINGS: Lower chest: The visualized lung bases are clear. No intra-abdominal free air.  Small free fluid in the pelvis. Hepatobiliary: No focal liver abnormality is seen. No gallstones, gallbladder wall thickening, or biliary dilatation. Pancreas: Unremarkable. No pancreatic ductal dilatation or surrounding inflammatory changes. Spleen: Normal in size without focal abnormality. Adrenals/Urinary Tract: The adrenal glands unremarkable. Multiple scattered hypoenhancing foci within the right kidney consistent with pyelonephritis superimposed on prior areas of parenchymal infarct. There is mild haziness of the right renal sinus fat. No drainable fluid collection/abscess. No hydronephrosis. The left kidney is unremarkable. The visualized ureters and urinary bladder appear unremarkable. Stomach/Bowel: There is no bowel obstruction or active inflammation. The appendix is normal. Vascular/Lymphatic: The abdominal aorta and IVC unremarkable. No portal venous gas. There is no adenopathy. Reproductive: The uterus is anteverted and grossly unremarkable. No adnexal masses. Other: None Musculoskeletal: No acute or significant osseous findings. IMPRESSION: 1. 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 Correlation with urinalysis recommended. No drainable fluid collection/abscess. 2. No bowel obstruction. Normal appendix. Electronically Signed   By: Anner Crete M.D.   On: 06/02/2020 17:29     Procedures Procedures   Medications Ordered in ED Medications  cefTRIAXone (  ROCEPHIN) 1 g in sodium chloride 0.9 % 100 mL IVPB (0 g Intravenous Stopped 06/04/20 1154)  promethazine (PHENERGAN) injection 25 mg (25 mg Intravenous Given 06/04/20 1133)  sodium chloride 0.9 % bolus 1,000 mL (0 mLs Intravenous Stopped 06/04/20 1253)  ibuprofen (ADVIL) tablet 600 mg (600 mg Oral Given 06/04/20 1124)    ED Course  I have reviewed the triage vital signs and the nursing notes.  Pertinent labs & imaging results that were available during my care of the patient were reviewed by me and considered in my medical decision making (see chart for details).  Clinical Course as of 06/04/20 1332  Sat Jun 04, 2020  1301 Rechecked, symptoms have resolved, remains afebrile, will dc home [RD]    Clinical Course User Index [RD] Lucrezia Starch, MD   MDM Rules/Calculators/A&P                         30 year old lady presents to ER with concern for ongoing nausea, fever in setting of recently diagnosed pyelonephritis.  Her CT from 2/10 demonstrated right-sided pyelonephritis with small areas of parenchymal infarcts.  Urine cultures from 2/09 showing E. coli with broad sensitivities including sensitivity to the prescribed Augmentin.  On exam, patient was well-appearing with stable vital signs.  Will recheck labs and provided fluids, antiemetics and dose of IV antibiotics.  Repeat labs demonstrate modest improvement in WBC, UA is completely clear.  Normal creatinine and normal electrolytes.  On reassessment, patient symptoms had resolved.  No fever, tachycardia, normal BP and remained well-appearing, tolerating PO.  Given the improvement in symptoms, improving labs, believe patient is appropriate for continued outpatient management.  I reviewed strict return precautions with patient and mother at bedside should she have uncontrolled N/V or worsening fevers, etc.  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  They  demonstrated good understanding of her current disease process.  Additionally recommended recheck with primary doctor early next week.  After the discussed management above, the patient was determined to be safe for discharge.  The patient was in agreement with this plan and all questions regarding their care were answered.  ED return precautions were discussed and the patient will return to the ED with any significant worsening of condition.   Final Clinical Impression(s) / ED Diagnoses Final diagnoses:  Pyelonephritis    Rx / DC Orders ED Discharge Orders         Ordered    promethazine (PHENERGAN) 25 MG tablet  Every 6 hours PRN,   Status:  Discontinued        06/04/20 1320    promethazine (PHENERGAN) 25 MG tablet  Every 6 hours PRN        06/04/20 1321           Lucrezia Starch, MD 06/04/20 1341

## 2020-06-04 NOTE — Discharge Instructions (Addendum)
I would recommend following up with your primary doctor on Monday or Tuesday.  Recommend taking Tylenol and Motrin as needed for fevers.  Take Zofran or Phenergan as needed for nausea.  If you have worsening fevers, uncontrolled nausea or vomiting, worsening abdominal pain, or other new concerning symptom, please return immediately to ER for reassessment.  Recommend continuing the previously prescribed antibiotics.

## 2020-06-04 NOTE — ED Triage Notes (Signed)
Pt reports continued lower abd pain, nausea, and fever.  Treated in ED 2 days ago for pyelonephritis.

## 2020-06-04 NOTE — Progress Notes (Signed)
E coli in resulted urine culture is susceptible to the Nitrofuranton and Augmentin she has been on. See she was seen in the ER again and given Rocephin as well. WBC is trending down, needs recheck in one week in office and sooner if needed.

## 2020-06-08 NOTE — Progress Notes (Signed)
Established patient visit   Patient: Eileen Davis   DOB: June 26, 1990   30 y.o. Female  MRN: [id number] Visit Date: 06/09/2020  Today's healthcare provider: Marcille Buffy, FNP   Chief Complaint  Patient presents with   Follow-up   Subjective    HPI  Follow up ER visit  Patient was seen in ER for abdominal pain, nausea and fever on 06/04/20 given Rocephin again due to continued elevated but stable white blood cell count. She was treated for pyelonephritis. She was continued on Nitrofurantoin and given Augmentin and Rocephin.  at initial emergency room visit , after office visit here on 06/01/2020 she worsened.  Treatment for this included IV Rocephin and Phenergan, patient advised to continue antibiotic and was prescribed phenergan. She reports excellent compliance with treatment.  She reports this condition is Improved. Patient reports nausea has resolved and she is no longer feeling fatigued.  She was given Rocephni x 2 in the emergency room. She was seen twice due to fever even with tylenol .  She finished Macrobid on 04/06/21.  She has Augmentin 875/125mg  3 days left.  She is off of anti- nausea medications x 2 1/2 days now.  Was switched to phenergan at 2nd emergency room visit.  She is afebrile now, without any fever reducers.   She is feeling much better now she reports.  Denies any abdominal pain or back pain.  Patient  denies any fever, body aches,chills, rash, chest pain, shortness of breath, nausea, vomiting, or diarrhea.  Denies dizziness, lightheadedness, pre syncopal or syncopal episodes.   -----------------------------------------------------------------------------------------     Medications: Outpatient Medications Prior to Visit  Medication Sig   amoxicillin-clavulanate (AUGMENTIN) 875-125 MG tablet Take 1 tablet by mouth every 12 (twelve) hours for 10 days.   amphetamine-dextroamphetamine (ADDERALL XR) 20 MG 24 hr capsule Take 1 capsule  (20 mg total) by mouth daily.   amphetamine-dextroamphetamine (ADDERALL XR) 20 MG 24 hr capsule Take 1 capsule (20 mg total) by mouth daily.   amphetamine-dextroamphetamine (ADDERALL XR) 20 MG 24 hr capsule Take 1 capsule (20 mg total) by mouth every morning.   amphetamine-dextroamphetamine (ADDERALL) 20 MG tablet Take 1 tablet (20 mg total) by mouth 2 (two) times daily.   fluconazole (DIFLUCAN) 150 MG tablet Take 1 tablet (150 mg total) by mouth as directed. Take one tablet by mouth on day 1. May repeat dose of one tablet by mouth on day four.   norethindrone (CAMILA) 0.35 MG tablet Take on pill daily.   ondansetron (ZOFRAN) 4 MG tablet Take 1 tablet (4 mg total) by mouth every 8 (eight) hours as needed for nausea or vomiting.   phenazopyridine (PYRIDIUM) 95 MG tablet Take 1 tablet (95 mg total) by mouth 3 (three) times daily as needed for pain (not for more than three days).   promethazine (PHENERGAN) 25 MG tablet Take 1 tablet (25 mg total) by mouth every 6 (six) hours as needed for nausea or vomiting.   propranolol (INDERAL) 40 MG tablet Take one pill twice daily.   rizatriptan (MAXALT) 10 MG tablet Take 1 tablet (10 mg total) by mouth as needed for migraine. May repeat in 2 hours if needed   sertraline (ZOLOFT) 100 MG tablet Take 1 tablet (100 mg total) by mouth daily.   traZODone (DESYREL) 50 MG tablet Take 0.5-1 tablets (25-50 mg total) by mouth at bedtime as needed for sleep.   No facility-administered medications prior to visit.    Review of Systems  Constitutional: Negative.   Respiratory: Negative.   Cardiovascular: Negative.   Gastrointestinal: Negative.   Genitourinary: Negative.   Musculoskeletal: Negative.   Skin: Negative.   Neurological: Negative.   Psychiatric/Behavioral: Negative.     Last CBC Lab Results  Component Value Date   WBC 10.1 06/09/2020   HGB 13.5 06/09/2020   HCT 40.5 06/09/2020   MCV 90.4 06/09/2020   MCH 30.1 06/09/2020   RDW 12.7  06/09/2020   PLT 497 (H) 38/25/0539   Last metabolic panel Lab Results  Component Value Date   GLUCOSE 101 (H) 06/09/2020   NA 137 06/09/2020   K 4.5 06/09/2020   CL 104 06/09/2020   CO2 22 06/09/2020   BUN 19 06/09/2020   CREATININE 0.60 06/09/2020   GFRNONAA >60 06/09/2020   GFRAA 137 03/22/2020   CALCIUM 9.1 06/09/2020   PROT 8.2 (H) 06/09/2020   ALBUMIN 3.8 06/09/2020   LABGLOB 2.1 03/22/2020   AGRATIO 2.0 03/22/2020   BILITOT 0.5 06/09/2020   ALKPHOS 103 06/09/2020   AST 29 06/09/2020   ALT 56 (H) 06/09/2020   ANIONGAP 11 06/09/2020       Objective    BP (!) 117/96    Pulse 69    Temp 97.9 F (36.6 C) (Oral)    Resp 16    Wt 120 lb 12.8 oz (54.8 kg)    LMP 05/25/2020 (LMP Unknown)    SpO2 100%    BMI 22.09 kg/m  BP Readings from Last 3 Encounters:  06/09/20 (!) 117/96  06/04/20 111/80  06/02/20 98/75   Wt Readings from Last 3 Encounters:  06/09/20 120 lb 12.8 oz (54.8 kg)  06/01/20 123 lb (55.8 kg)  03/22/20 118 lb 1.6 oz (53.6 kg)       Physical Exam Vitals reviewed.  Constitutional:      General: She is not in acute distress.    Appearance: Normal appearance. She is not ill-appearing, toxic-appearing or diaphoretic.     Comments: Patient is alert and oriented and responsive to questions Engages in eye contact with provider. Speaks in full sentences without any pauses without any shortness of breath or distress.    HENT:     Head: Normocephalic and atraumatic.     Right Ear: External ear normal.     Left Ear: External ear normal.     Mouth/Throat:     Mouth: Mucous membranes are moist.  Eyes:     General: No scleral icterus.    Conjunctiva/sclera: Conjunctivae normal.  Cardiovascular:     Rate and Rhythm: Normal rate and regular rhythm.     Pulses: Normal pulses.     Heart sounds: Normal heart sounds. No murmur heard. No friction rub. No gallop.   Pulmonary:     Effort: Pulmonary effort is normal.     Breath sounds: Normal breath sounds.   Abdominal:     General: There is no distension.     Palpations: Abdomen is soft.     Tenderness: There is no abdominal tenderness. There is no right CVA tenderness, left CVA tenderness or guarding.  Musculoskeletal:        General: Normal range of motion.     Cervical back: Neck supple.  Skin:    General: Skin is warm.     Findings: No rash.  Neurological:     Mental Status: She is alert and oriented to person, place, and time.  Psychiatric:        Mood and Affect: Mood normal.  Behavior: Behavior normal.        Thought Content: Thought content normal.        Judgment: Judgment normal.      Results for orders placed or performed during the hospital encounter of 06/09/20  CBC with Differential/Platelet  Result Value Ref Range   WBC 10.1 4.0 - 10.5 K/uL   RBC 4.48 3.87 - 5.11 MIL/uL   Hemoglobin 13.5 12.0 - 15.0 g/dL   HCT 40.5 36.0 - 46.0 %   MCV 90.4 80.0 - 100.0 fL   MCH 30.1 26.0 - 34.0 pg   MCHC 33.3 30.0 - 36.0 g/dL   RDW 12.7 11.5 - 15.5 %   Platelets 497 (H) 150 - 400 K/uL   nRBC 0.0 0.0 - 0.2 %   Neutrophils Relative % 54 %   Neutro Abs 5.5 1.7 - 7.7 K/uL   Lymphocytes Relative 30 %   Lymphs Abs 3.0 0.7 - 4.0 K/uL   Monocytes Relative 9 %   Monocytes Absolute 0.9 0.1 - 1.0 K/uL   Eosinophils Relative 2 %   Eosinophils Absolute 0.2 0.0 - 0.5 K/uL   Basophils Relative 1 %   Basophils Absolute 0.1 0.0 - 0.1 K/uL   Immature Granulocytes 4 %   Abs Immature Granulocytes 0.43 (H) 0.00 - 0.07 K/uL  Comprehensive metabolic panel  Result Value Ref Range   Sodium 137 135 - 145 mmol/L   Potassium 4.5 3.5 - 5.1 mmol/L   Chloride 104 98 - 111 mmol/L   CO2 22 22 - 32 mmol/L   Glucose, Bld 101 (H) 70 - 99 mg/dL   BUN 19 6 - 20 mg/dL   Creatinine, Ser 0.60 0.44 - 1.00 mg/dL   Calcium 9.1 8.9 - 10.3 mg/dL   Total Protein 8.2 (H) 6.5 - 8.1 g/dL   Albumin 3.8 3.5 - 5.0 g/dL   AST 29 15 - 41 U/L   ALT 56 (H) 0 - 44 U/L   Alkaline Phosphatase 103 38 - 126 U/L    Total Bilirubin 0.5 0.3 - 1.2 mg/dL   GFR, Estimated >60 >60 mL/min   Anion gap 11 5 - 15  Results for orders placed or performed in visit on 06/09/20  POCT Urinalysis Dip Manual  Result Value Ref Range   Spec Grav, UA 1.025 1.010 - 1.025   pH, UA 5.0 5.0 - 8.0   Leukocytes, UA Negative Negative   Nitrite, UA Negative Negative   Poct Protein Negative Negative, trace mg/dL   Poct Glucose Normal Normal mg/dL   Poct Ketones Negative Negative   Poct Urobilinogen Normal Normal mg/dL   Poct Bilirubin Negative Negative   Poct Blood Negative Negative, trace    Assessment & Plan     Pyelonephritis - Plan: POCT Urinalysis Dip Manual, CBC with Differential/Platelet, Comprehensive Metabolic Panel (CMET), Urine Culture  Follow-up exam  E. coli infection  Orders Placed This Encounter  Procedures   Urine Culture   CBC with Differential/Platelet   Comprehensive Metabolic Panel (CMET)   POCT Urinalysis Dip Manual    Recheck CBC and CMP today to be sure leukocytosis resolving with treatment, she is currently asymptomatic with 3 days of Augmentin left.   Patient is advise to return if any symptoms change, return, persist or worsen at anytime.   Note given she would like to return to work 06/10/2020, ok to change based on labs or if any symptoms return.   Red Flags discussed. The patient was given clear instructions to go  to ER or return to medical center if any red flags develop, symptoms do not improve, worsen or new problems develop. They verbalized understanding.  Return if symptoms worsen or fail to improve, for at any time for any worsening symptoms, Go to Emergency room/ urgent care if worse.     The entirety of the information documented in the History of Present Illness, Review of Systems and Physical Exam were personally obtained by me. Portions of this information were initially documented by the CMA and reviewed by me for thoroughness and accuracy.   Marcille Buffy,  [place] [phone] (phone) [phone] (fax)  [place]

## 2020-06-09 ENCOUNTER — Ambulatory Visit: Payer: 59 | Admitting: Adult Health

## 2020-06-09 ENCOUNTER — Encounter: Payer: Self-pay | Admitting: Adult Health

## 2020-06-09 ENCOUNTER — Other Ambulatory Visit
Admission: RE | Admit: 2020-06-09 | Discharge: 2020-06-09 | Disposition: A | Discharge status: Home / Self Care | Payer: 59 | Source: Ambulatory Visit | Attending: Adult Health | Admitting: Adult Health

## 2020-06-09 ENCOUNTER — Other Ambulatory Visit: Payer: Self-pay | Admitting: Adult Health

## 2020-06-09 ENCOUNTER — Other Ambulatory Visit: Payer: Self-pay

## 2020-06-09 VITALS — BP 117/96 | HR 69 | Temp 97.9°F | Resp 16 | Wt 120.8 lb

## 2020-06-09 DIAGNOSIS — A498 Other bacterial infections of unspecified site: Secondary | ICD-10-CM | POA: Diagnosis not present

## 2020-06-09 DIAGNOSIS — Z09 Encounter for follow-up examination after completed treatment for conditions other than malignant neoplasm: Secondary | ICD-10-CM | POA: Insufficient documentation

## 2020-06-09 DIAGNOSIS — N12 Tubulo-interstitial nephritis, not specified as acute or chronic: Secondary | ICD-10-CM | POA: Insufficient documentation

## 2020-06-09 LAB — COMPREHENSIVE METABOLIC PANEL
ALT: 56 U/L — ABNORMAL HIGH (ref 0–44)
AST: 29 U/L (ref 15–41)
Albumin: 3.8 g/dL (ref 3.5–5.0)
Alkaline Phosphatase: 103 U/L (ref 38–126)
Anion gap: 11 (ref 5–15)
BUN: 19 mg/dL (ref 6–20)
CO2: 22 mmol/L (ref 22–32)
Calcium: 9.1 mg/dL (ref 8.9–10.3)
Chloride: 104 mmol/L (ref 98–111)
Creatinine, Ser: 0.6 mg/dL (ref 0.44–1.00)
GFR, Estimated: >60 mL/min (ref >60)
Glucose, Bld: 101 mg/dL — ABNORMAL HIGH (ref 70–99)
Potassium: 4.5 mmol/L (ref 3.5–5.1)
Sodium: 137 mmol/L (ref 135–145)
Total Bilirubin: 0.5 mg/dL (ref 0.3–1.2)
Total Protein: 8.2 g/dL — ABNORMAL HIGH (ref 6.5–8.1)

## 2020-06-09 LAB — CBC WITH DIFFERENTIAL/PLATELET
Abs Immature Granulocytes: 0.43 10*3/uL — ABNORMAL HIGH (ref 0.00–0.07)
Basophils Absolute: 0.1 10*3/uL (ref 0.0–0.1)
Basophils Relative: 1 %
Eosinophils Absolute: 0.2 10*3/uL (ref 0.0–0.5)
Eosinophils Relative: 2 %
HCT: 40.5 % (ref 36.0–46.0)
Hemoglobin: 13.5 g/dL (ref 12.0–15.0)
Immature Granulocytes: 4 %
Lymphocytes Relative: 30 %
Lymphs Abs: 3 10*3/uL (ref 0.7–4.0)
MCH: 30.1 pg (ref 26.0–34.0)
MCHC: 33.3 g/dL (ref 30.0–36.0)
MCV: 90.4 fL (ref 80.0–100.0)
Monocytes Absolute: 0.9 10*3/uL (ref 0.1–1.0)
Monocytes Relative: 9 %
Neutro Abs: 5.5 10*3/uL (ref 1.7–7.7)
Neutrophils Relative %: 54 %
Platelets: 497 10*3/uL — ABNORMAL HIGH (ref 150–400)
RBC: 4.48 MIL/uL (ref 3.87–5.11)
RDW: 12.7 % (ref 11.5–15.5)
WBC: 10.1 10*3/uL (ref 4.0–10.5)
nRBC: 0 % (ref 0.0–0.2)

## 2020-06-09 LAB — POCT URINALYSIS DIPSTICK (MANUAL)
Leukocytes, UA: NEGATIVE
Nitrite, UA: NEGATIVE
Poct Bilirubin: NEGATIVE
Poct Blood: NEGATIVE
Poct Glucose: NORMAL mg/dL
Poct Ketones: NEGATIVE
Poct Protein: NEGATIVE mg/dL
Poct Urobilinogen: NORMAL mg/dL
Spec Grav, UA: 1.025 (ref 1.010–1.025)
pH, UA: 5 (ref 5.0–8.0)

## 2020-06-09 NOTE — Progress Notes (Signed)
Normal urine in office today, given history am sending for culture to be sure resolved. 06/09/2020

## 2020-06-09 NOTE — Progress Notes (Signed)
CMP ok likely not fasting.  CBC shows continued resolution of elevated white blood cells, platelets likely elevated due to recent infection.  Recheck fasting CBC, CMP in 3 weeks advised. Return if any symptoms worsen or change. Orders placed can walk in to lab, no office visit needed if feeling well.

## 2020-06-09 NOTE — Patient Instructions (Signed)
Pyelonephritis, Adult    Pyelonephritis is an infection that occurs in the kidney. The kidneys are organs that help clean the blood by moving waste out of the blood and into the pee (urine). This infection can happen quickly, or it can last for a long time. In most cases, it clears up with treatment and does not cause other problems.  What are the causes?  This condition may be caused by:  · Germs (bacteria) going from the bladder up to the kidney. This may happen after having a bladder infection.  · Germs going from the blood to the kidney.  What increases the risk?  This condition is more likely to develop in:  · Pregnant women.  · Older people.  · People who have any of these conditions:  ? Diabetes.  ? Inflammation of the prostate gland (prostatitis), in males.  ? Kidney stones or bladder stones.  ? Other problems with the kidney or the parts of your body that carry pee from the kidneys to the bladder (ureters).  ? Cancer.  · People who have a small, thin tube (catheter) placed in the bladder.  · People who are sexually active.  · Women who use a medicine that kills sperm (spermicide) to prevent pregnancy.  · People who have had a prior urinary tract infection (UTI).  What are the signs or symptoms?  Symptoms of this condition include:  · Peeing often.  · A strong urge to pee right away.  · Burning or stinging when peeing.  · Belly pain.  · Back pain.  · Pain in the side (flank area).  · Fever or chills.  · Blood in the pee, or dark pee.  · Feeling sick to your stomach (nauseous) or throwing up (vomiting).  How is this treated?  This condition may be treated by:  · Taking antibiotic medicines by mouth (orally).  · Drinking enough fluids.  If the infection is bad, you may need to stay in the hospital. You may be given antibiotics and fluids that are put directly into a vein through an IV tube.  In some cases, other treatments may be needed.  Follow these instructions at home:  Medicines  · Take your antibiotic  medicine as told by your doctor. Do not stop taking the antibiotic even if you start to feel better.  · Take over-the-counter and prescription medicines only as told by your doctor.  General instructions    · Drink enough fluid to keep your pee pale yellow.  · Avoid caffeine, tea, and carbonated drinks.  · Pee (urinate) often. Avoid holding in pee for long periods of time.  · Pee before and after sex.  · After pooping (having a bowel movement), women should wipe from front to back. Use each tissue only once.  · Keep all follow-up visits as told by your doctor. This is important.  Contact a doctor if:  · You do not feel better after 2 days.  · Your symptoms get worse.  · You have a fever.  Get help right away if:  · You cannot take your medicine or drink fluids as told.  · You have chills and shaking.  · You throw up.  · You have very bad pain in your side or back.  · You feel very weak or you pass out (faint).  Summary  · Pyelonephritis is an infection that occurs in the kidney.  · In most cases, this infection clears up with treatment and does   not cause other problems.  · Take your antibiotic medicine as told by your doctor. Do not stop taking the antibiotic even if you start to feel better.  · Drink enough fluid to keep your pee pale yellow.  This information is not intended to replace advice given to you by your health care provider. Make sure you discuss any questions you have with your health care provider.  Document Revised: 02/11/2018 Document Reviewed: 02/11/2018  Elsevier Patient Education © 2021 [employer]

## 2020-06-10 ENCOUNTER — Other Ambulatory Visit: Payer: Self-pay | Admitting: Physician Assistant

## 2020-06-10 DIAGNOSIS — F909 Attention-deficit hyperactivity disorder, unspecified type: Secondary | ICD-10-CM

## 2020-06-11 LAB — URINE CULTURE

## 2020-06-13 MED ORDER — AMPHETAMINE-DEXTROAMPHET ER 20 MG PO CP24: 20.0000 mg | ORAL_CAPSULE | Freq: Every day | ORAL | 0 refills | Status: DC

## 2020-06-13 NOTE — Progress Notes (Signed)
Less than 10,000 units of bacteria in urine culture. Complete all of antibiotics and follow up if any symptoms occur.

## 2020-06-28 ENCOUNTER — Encounter: Payer: Self-pay | Admitting: Physician Assistant

## 2020-07-12 ENCOUNTER — Encounter: Payer: 59 | Admitting: Obstetrics and Gynecology

## 2020-07-18 ENCOUNTER — Ambulatory Visit: Payer: Self-pay | Admitting: Physician Assistant

## 2020-08-01 ENCOUNTER — Ambulatory Visit: Payer: Self-pay | Admitting: Family Medicine

## 2020-08-07 ENCOUNTER — Other Ambulatory Visit: Payer: Self-pay | Admitting: Physician Assistant

## 2020-08-07 DIAGNOSIS — G43109 Migraine with aura, not intractable, without status migrainosus: Secondary | ICD-10-CM

## 2020-08-08 ENCOUNTER — Other Ambulatory Visit: Payer: Self-pay | Admitting: Physician Assistant

## 2020-08-08 DIAGNOSIS — F32A Depression, unspecified: Secondary | ICD-10-CM

## 2020-08-08 DIAGNOSIS — F419 Anxiety disorder, unspecified: Secondary | ICD-10-CM

## 2020-08-08 NOTE — Telephone Encounter (Signed)
Requested medication (s) are due for refill today:yes  Requested medication (s) are on the active medication list:  yes  Last refill: 07/07/2020  Future visit scheduled: no  Notes to clinic:  Failed Protocol: Completed PHQ-2 or PHQ-9 in the last 360 days   Requested Prescriptions  Pending Prescriptions Disp Refills   sertraline (ZOLOFT) 100 MG tablet [Pharmacy Med Name: SERTRALINE 100MG  TABLETS] 90 tablet 1    Sig: TAKE 1 TABLET(100 MG) BY MOUTH DAILY      Psychiatry:  Antidepressants - SSRI Failed - 08/08/2020  3:31 AM      Failed - Completed PHQ-2 or PHQ-9 in the last 360 days      Passed - Valid encounter within last 6 months    Recent Outpatient Visits           2 months ago Pyelonephritis   [place] [facility], Kelby Aline, FNP   2 months ago [place] [facility], Kelby Aline, FNP   4 months ago Annual physical exam   [facility] Carles Collet M, PA-C   8 months ago Attention deficit hyperactivity disorder (ADHD), unspecified ADHD type   [facility] [place], Wendee Beavers, [place]   1 year ago Anxiety and depression   [place], Wendee Beavers, [place]       Future Appointments             In 2 weeks Rubie Maid, MD [facility]

## 2020-08-09 ENCOUNTER — Other Ambulatory Visit: Payer: Self-pay | Admitting: Physician Assistant

## 2020-08-09 DIAGNOSIS — F909 Attention-deficit hyperactivity disorder, unspecified type: Secondary | ICD-10-CM

## 2020-08-09 MED ORDER — AMPHETAMINE-DEXTROAMPHET ER 20 MG PO CP24: 20.0000 mg | ORAL_CAPSULE | Freq: Every day | ORAL | 0 refills | Status: DC

## 2020-08-09 MED ORDER — AMPHETAMINE-DEXTROAMPHETAMINE 20 MG PO TABS: 20.0000 mg | ORAL_TABLET | Freq: Two times a day (BID) | ORAL | 0 refills | Status: DC | PRN

## 2020-08-09 NOTE — Telephone Encounter (Signed)
Can you please clarify this prescription request. The request is for both Adderall XR and immediate release Adderall. Is she taking both? Need to know exactly what and how she is taking the medications.

## 2020-08-09 NOTE — Telephone Encounter (Signed)
Patient takes the long acting on the days that she works, typically 5--6 week She takes the regular on the other days, typically 4-10 per month  She said she only needs like 10 of the regular

## 2020-08-09 NOTE — Telephone Encounter (Signed)
Medication:  amphetamine-dextroamphetamine (ADDERALL XR) 20 MG 24 hr capsule  amphetamine-dextroamphetamine (ADDERALL) 20 MG tablet  Has the pt contacted their pharmacy? No  Preferred pharmacy: [id number] - [place], [address] AT [address]  Pt needs follow up appointment with a provider for future refills. Please call pt to schedule.

## 2020-08-24 ENCOUNTER — Ambulatory Visit (INDEPENDENT_AMBULATORY_CARE_PROVIDER_SITE_OTHER): Payer: 59 | Admitting: Obstetrics and Gynecology

## 2020-08-24 ENCOUNTER — Encounter: Payer: Self-pay | Admitting: Obstetrics and Gynecology

## 2020-08-24 ENCOUNTER — Other Ambulatory Visit (HOSPITAL_COMMUNITY)
Admission: RE | Admit: 2020-08-24 | Discharge: 2020-08-24 | Disposition: A | Discharge status: Home / Self Care | Payer: 59 | Source: Ambulatory Visit | Attending: Obstetrics and Gynecology | Admitting: Obstetrics and Gynecology

## 2020-08-24 ENCOUNTER — Other Ambulatory Visit: Payer: Self-pay

## 2020-08-24 VITALS — BP 122/80 | HR 75 | Ht 62.0 in | Wt 122.9 lb

## 2020-08-24 DIAGNOSIS — Z30017 Encounter for initial prescription of implantable subdermal contraceptive: Secondary | ICD-10-CM

## 2020-08-24 DIAGNOSIS — Z124 Encounter for screening for malignant neoplasm of cervix: Secondary | ICD-10-CM

## 2020-08-24 DIAGNOSIS — Z87440 Personal history of urinary (tract) infections: Secondary | ICD-10-CM

## 2020-08-24 DIAGNOSIS — Z01419 Encounter for gynecological examination (general) (routine) without abnormal findings: Secondary | ICD-10-CM

## 2020-08-24 DIAGNOSIS — Z3009 Encounter for other general counseling and advice on contraception: Secondary | ICD-10-CM

## 2020-08-24 NOTE — Progress Notes (Signed)
GYN-Pt present for annual exam. Pt stated that she would like to discuss other options of birth control.

## 2020-08-24 NOTE — Patient Instructions (Addendum)
NEXPLANON PLACEMENT POST-PROCEDURE INSTRUCTIONS  1. You may take Ibuprofen, Aleve or Tylenol for pain if needed.  Pain should resolve within in 24 hours.  2. You may have intercourse after 24 hours.  If you using this for birth control, it is effective immediately.  3. You need to call if you have any fever, heavy bleeding, or redness at insertion site. Irregular bleeding is common the first several months after having a Nexplanonplaced. You do not need to call for this reason unless you are concerned.  4. Shower or bathe as normal.  You can remove the bandage after 24 hours.    Preventive Care 28-55 Years Old, Female Preventive care refers to lifestyle choices and visits with your health care provider that can promote health and wellness. This includes:  A yearly physical exam. This is also called an annual wellness visit.  Regular dental and eye exams.  Immunizations.  Screening for certain conditions.  Healthy lifestyle choices, such as: ? Eating a healthy diet. ? Getting regular exercise. ? Not using drugs or products that contain nicotine and tobacco. ? Limiting alcohol use. What can I expect for my preventive care visit? Physical exam Your health care provider may check your:  Height and weight. These may be used to calculate your BMI (body mass index). BMI is a measurement that tells if you are at a healthy weight.  Heart rate and blood pressure.  Body temperature.  Skin for abnormal spots. Counseling Your health care provider may ask you questions about your:  Past medical problems.  Family's medical history.  Alcohol, tobacco, and drug use.  Emotional well-being.  Home life and relationship well-being.  Sexual activity.  Diet, exercise, and sleep habits.  Work and work Statistician.  Access to firearms.  Method of birth control.  Menstrual cycle.  Pregnancy history. What immunizations do I need? Vaccines are usually given at various ages,  according to a schedule. Your health care provider will recommend vaccines for you based on your age, medical history, and lifestyle or other factors, such as travel or where you work.   What tests do I need? Blood tests  Lipid and cholesterol levels. These may be checked every 5 years starting at age 22.  Hepatitis C test.  Hepatitis B test. Screening  Diabetes screening. This is done by checking your blood sugar (glucose) after you have not eaten for a while (fasting).  STD (sexually transmitted disease) testing, if you are at risk.  BRCA-related cancer screening. This may be done if you have a family history of breast, ovarian, tubal, or peritoneal cancers.  Pelvic exam and Pap test. This may be done every 3 years starting at age 63. Starting at age 74, this may be done every 5 years if you have a Pap test in combination with an HPV test. Talk with your health care provider about your test results, treatment options, and if necessary, the need for more tests.   Follow these instructions at home: Eating and drinking  Eat a healthy diet that includes fresh fruits and vegetables, whole grains, lean protein, and low-fat dairy products.  Take vitamin and mineral supplements as recommended by your health care provider.  Do not drink alcohol if: ? Your health care provider tells you not to drink. ? You are pregnant, may be pregnant, or are planning to become pregnant.  If you drink alcohol: ? Limit how much you have to 0-1 drink a day. ? Be aware of how much alcohol is  in your drink. In the [place] one drink equals one 12 oz bottle of beer (355 mL), one 5 oz glass of wine (148 mL), or one 1 oz glass of hard liquor (44 mL).   Lifestyle  Take daily care of your teeth and gums. Brush your teeth every morning and night with fluoride toothpaste. Floss one time each day.  Stay active. Exercise for at least 30 minutes 5 or more days each week.  Do not use any products that contain nicotine  or tobacco, such as cigarettes, e-cigarettes, and chewing tobacco. If you need help quitting, ask your health care provider.  Do not use drugs.  If you are sexually active, practice safe sex. Use a condom or other form of protection to prevent STIs (sexually transmitted infections).  If you do not wish to become pregnant, use a form of birth control. If you plan to become pregnant, see your health care provider for a prepregnancy visit.  Find healthy ways to cope with stress, such as: ? Meditation, yoga, or listening to music. ? Journaling. ? Talking to a trusted person. ? Spending time with friends and family. Safety  Always wear your seat belt while driving or riding in a vehicle.  Do not drive: ? If you have been drinking alcohol. Do not ride with someone who has been drinking. ? When you are tired or distracted. ? While texting.  Wear a helmet and other protective equipment during sports activities.  If you have firearms in your house, make sure you follow all gun safety procedures.  Seek help if you have been physically or sexually abused. What's next?  Go to your health care provider once a year for an annual wellness visit.  Ask your health care provider how often you should have your eyes and teeth checked.  Stay up to date on all vaccines. This information is not intended to replace advice given to you by your health care provider. Make sure you discuss any questions you have with your health care provider. Document Revised: 12/06/2019 Document Reviewed: 12/19/2017 Elsevier Patient Education  2021 [place] Breast self-awareness is knowing how your breasts look and feel. Doing breast self-awareness is important. It allows you to catch a breast problem early while it is still small and can be treated. All women should do breast self-awareness, including women who have had breast implants. Tell your doctor if you notice a change in your  breasts. What you need:  A mirror.  A well-lit room. How to do a breast self-exam A breast self-exam is one way to learn what is normal for your breasts and to check for changes. To do a breast self-exam: Look for changes 1. Take off all the clothes above your waist. 2. Stand in front of a mirror in a room with good lighting. 3. Put your hands on your hips. 4. Push your hands down. 5. Look at your breasts and nipples in the mirror to see if one breast or nipple looks different from the other. Check to see if: ? The shape of one breast is different. ? The size of one breast is different. ? There are wrinkles, dips, and bumps in one breast and not the other. 6. Look at each breast for changes in the skin, such as: ? Redness. ? Scaly areas. 7. Look for changes in your nipples, such as: ? Liquid around the nipples. ? Bleeding. ? Dimpling. ? Redness. ? A change in where the nipples are.  Feel for changes 1. Lie on your back on the floor. 2. Feel each breast. To do this, follow these steps: ? Pick a breast to feel. ? Put the arm closest to that breast above your head. ? Use your other arm to feel the nipple area of your breast. Feel the area with the pads of your three middle fingers by making small circles with your fingers. For the first circle, press lightly. For the second circle, press harder. For the third circle, press even harder. ? Keep making circles with your fingers at the different pressures as you move down your breast. Stop when you feel your ribs. ? Move your fingers a little toward the center of your body. ? Start making circles with your fingers again, this time going up until you reach your collarbone. ? Keep making up-and-down circles until you reach your armpit. Remember to keep using the three pressures. ? Feel the other breast in the same way. 3. Sit or stand in the tub or shower. 4. With soapy water on your skin, feel each breast the same way you did in step 2  when you were lying on the floor.   Write down what you find Writing down what you find can help you remember what to tell your doctor. Write down:  What is normal for each breast.  Any changes you find in each breast, including: ? The kind of changes you find. ? Whether you have pain. ? Size and location of any lumps.  When you last had your menstrual period. General tips  Check your breasts every month.  If you are breastfeeding, the best time to check your breasts is after you feed your baby or after you use a breast pump.  If you get menstrual periods, the best time to check your breasts is 5-7 days after your menstrual period is over.  With time, you will become comfortable with the self-exam, and you will begin to know if there are changes in your breasts. Contact a doctor if you:  See a change in the shape or size of your breasts or nipples.  See a change in the skin of your breast or nipples, such as red or scaly skin.  Have fluid coming from your nipples that is not normal.  Find a lump or thick area that was not there before.  Have pain in your breasts.  Have any concerns about your breast health. Summary  Breast self-awareness includes looking for changes in your breasts, as well as feeling for changes within your breasts.  Breast self-awareness should be done in front of a mirror in a well-lit room.  You should check your breasts every month. If you get menstrual periods, the best time to check your breasts is 5-7 days after your menstrual period is over.  Let your doctor know of any changes you see in your breasts, including changes in size, changes on the skin, pain or tenderness, or fluid from your nipples that is not normal. This information is not intended to replace advice given to you by your health care provider. Make sure you discuss any questions you have with your health care provider. Document Revised: 11/26/2017 Document Reviewed:  11/26/2017 Elsevier Patient Education  [place].

## 2020-08-24 NOTE — Progress Notes (Signed)
GYNECOLOGY ANNUAL PHYSICAL EXAM PROGRESS NOTE  Subjective:    Eileen Davis is a 30 y.o. G0P0 female who presents for an annual exam. The patient is sexually active, 1 partner, female.  The patient wears seatbelts: yes. The patient participates in regular exercise: yes. Has the patient ever been transfused or tattooed?: no.    The patient has the following concerns:  1. Reports history of recent ER visit for a complicated UTI.  Had been placed on oral antibiotics a day prior but had a subsequent high fever and significant pain. Reports reciving 2 IM injections of Rocephin and a second oral antibiotic to take.  Has h/o recurrent UTI's and pyelonephritis in the past. Is concerned of the effects on her cycle and her birth control as her last cycle was somewhat abnormal (came almost 1 week earlier). 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  2. She and her fiance are planning on getting married next year. May consider conceiving in the next 3-4 years. Desires to discuss contraceptive options until then.  Currently on progesterone OCPs.     Menstrual History: Menarche age: 43 Patient's last menstrual period was 08/04/2020. Period Duration (Days): 4-5 Period Pattern: Regular Menstrual Flow: Heavy,Moderate,Light Menstrual Control: Other (Comment) Menstrual Control Change Freq (Hours): 12 Dysmenorrhea: (!) Moderate Dysmenorrhea Symptoms: Cramping    Gynecologic History Contraception: progesterone-only OCPs.  History of STI's: Denies  Last Pap:  06/26/2017. Results were: normal.  Remote h/o abnormal pap smear s/p LEEP.    Upstream - 08/24/20 1114      Pregnancy Intention Screening   Does the patient want to become pregnant in the next year? No    Does the patient's partner want to become pregnant in the next year? No    Would the patient like to discuss contraceptive options today? Yes      Contraception Wrap Up   Current Method Oral Contraceptive     End Method Hormonal Implant    Contraception Counseling Provided Yes          The pregnancy intention screening data noted above was reviewed. Potential methods of contraception were discussed. The patient elected to proceed with Hormonal Implant.    OB History  Gravida Para Term Preterm AB Living  0 0 0 0 0 0  SAB IAB Ectopic Multiple Live Births  0 0 0 0 0    Past Medical History:  Diagnosis Date  . ADHD   . Anxiety and depression   . Depression   . Migraine   . Recurrent UTI   . Ureteral reflux 2005   Right side    Past Surgical History:  Procedure Laterality Date  . CERVICAL BIOPSY  W/ LOOP ELECTRODE EXCISION    . WISDOM TOOTH EXTRACTION  06/2016    Family History  Problem Relation Age of Onset  . Osteoporosis Mother   . Diabetes Father   . Hypertension Father   . Healthy Brother   . ADD / ADHD Brother   . Ovarian cancer Maternal Grandmother   . Prostate cancer Maternal Grandfather   . Congestive Heart Failure Paternal Grandmother   . CVA Paternal Grandmother   . Lung cancer Paternal Grandfather     Social History   Socioeconomic History  . Marital status: Significant Other    Spouse name: Not on file  . Number of children: Not on file  . Years of education: Not on file  . Highest  education level: Not on file  Occupational History  . Not on file  Tobacco Use  . Smoking status: Former Smoker    Packs/day: 0.00  . Smokeless tobacco: Never Used  Vaping Use  . Vaping Use: Former  Substance and Sexual Activity  . Alcohol use: Yes    Comment: Rarely  . Drug use: No  . Sexual activity: Yes    Birth control/protection: Condom, Pill  Other Topics Concern  . Not on file  Social History Narrative  . Not on file   Social Determinants of Health   Financial Resource Strain: Not on file  Food Insecurity: Not on file  Transportation Needs: Not on file  Physical Activity: Not on file  Stress: Not on file  Social Connections: Not on file   Intimate Partner Violence: Not on file    Current Outpatient Medications on File Prior to Visit  Medication Sig Dispense Refill  . amphetamine-dextroamphetamine (ADDERALL) 20 MG tablet Take 1 tablet (20 mg total) by mouth 2 (two) times daily as needed. 20 tablet 0  . norethindrone (CAMILA) 0.35 MG tablet Take on pill daily. 84 tablet 3  . ondansetron (ZOFRAN) 4 MG tablet Take 1 tablet (4 mg total) by mouth every 8 (eight) hours as needed for nausea or vomiting. 20 tablet 0  . propranolol (INDERAL) 40 MG tablet Take one pill twice daily. 180 tablet 0  . rizatriptan (MAXALT) 10 MG tablet Take 1 tablet (10 mg total) by mouth as needed for migraine. May repeat in 2 hours if needed 10 tablet 0  . sertraline (ZOLOFT) 100 MG tablet TAKE 1 TABLET(100 MG) BY MOUTH DAILY 30 tablet 0  . traZODone (DESYREL) 50 MG tablet Take 0.5-1 tablets (25-50 mg total) by mouth at bedtime as needed for sleep. 30 tablet 3  . amphetamine-dextroamphetamine (ADDERALL XR) 20 MG 24 hr capsule Take 1 capsule (20 mg total) by mouth daily. (Patient not taking: Reported on 08/24/2020) 30 capsule 0  . fluconazole (DIFLUCAN) 150 MG tablet Take 1 tablet (150 mg total) by mouth as directed. Take one tablet by mouth on day 1. May repeat dose of one tablet by mouth on day four. (Patient not taking: Reported on 08/24/2020) 2 tablet 0  . phenazopyridine (PYRIDIUM) 95 MG tablet Take 1 tablet (95 mg total) by mouth 3 (three) times daily as needed for pain (not for more than three days). (Patient not taking: Reported on 08/24/2020) 10 tablet 0  . promethazine (PHENERGAN) 25 MG tablet Take 1 tablet (25 mg total) by mouth every 6 (six) hours as needed for nausea or vomiting. (Patient not taking: Reported on 08/24/2020) 20 tablet 0   No current facility-administered medications on file prior to visit.    Allergies  Allergen Reactions  . Bee Venom   . Latex      Review of Systems Constitutional: negative for chills, fatigue, fevers and  sweats Eyes: negative for irritation, redness and visual disturbance Ears, nose, mouth, throat, and face: negative for hearing loss, nasal congestion, snoring and tinnitus Respiratory: negative for asthma, cough, sputum Cardiovascular: negative for chest pain, dyspnea, exertional chest pressure/discomfort, irregular heart beat, palpitations and syncope Gastrointestinal: negative for abdominal pain, change in bowel habits, nausea and vomiting Genitourinary: Positive for abnormal menstrual period (x 1, see HPI) and recent UTI.  Negative for genital lesions, sexual problems and vaginal discharge, dysuria and urinary incontinence Integument/breast: negative for breast lump, breast tenderness and nipple discharge Hematologic/lymphatic: negative for bleeding and easy bruising Musculoskeletal:negative for back  pain and muscle weakness Neurological:  Negative for headaches, dizziness, vertigo and weakness Endocrine: negative for diabetic symptoms including polydipsia, polyuria and skin dryness Allergic/Immunologic: negative for hay fever and urticaria      Objective:  Blood pressure 122/80, pulse 75, height 5\' 2"  (1.575 m), weight 122 lb 14.4 oz (55.7 kg), last menstrual period 08/04/2020. Body mass index is 22.48 kg/m.   General Appearance:    Alert, cooperative, no distress, appears stated age  Head:    Normocephalic, without obvious abnormality, atraumatic  Eyes:    PERRL, conjunctiva/corneas clear, EOM's intact, both eyes  Ears:    Normal external ear canals, both ears  Nose:   Nares normal, septum midline, mucosa normal, no drainage or sinus tenderness  Throat:   Lips, mucosa, and tongue normal; teeth and gums normal  Neck:   Supple, symmetrical, trachea midline, no adenopathy; thyroid: no enlargement/tenderness/nodules; no carotid bruit or JVD  Back:     Symmetric, no curvature, ROM normal, no CVA tenderness  Lungs:     Clear to auscultation bilaterally, respirations unlabored  Chest Wall:     No tenderness or deformity   Heart:    Regular rate and rhythm, S1 and S2 normal, no murmur, rub or gallop  Breast Exam:    No tenderness, masses, or nipple abnormality. Bilateral piercings present (recently acquired)   Abdomen:     Soft, non-tender, bowel sounds active all four quadrants, no masses, no organomegaly.    Genitalia:    Pelvic:external genitalia normal, vagina without lesions, discharge, or tenderness, rectovaginal septum  normal. Cervix normal in appearance, no cervical motion tenderness, no adnexal masses or tenderness. IUD threads visualized from external cervical os, ~ 3 cm in length.  Uterus normal size, shape, mobile, regular contours, nontender.  Rectal:    Normal external sphincter.  No hemorrhoids appreciated. Internal exam not done.   Extremities:   Extremities normal, atraumatic, no cyanosis or edema  Pulses:   2+ and symmetric all extremities  Skin:   Skin color, texture, turgor normal, no rashes or lesions  Lymph nodes:   Cervical, supraclavicular, and axillary nodes normal  Neurologic:   CNII-XII intact, normal strength, sensation and reflexes throughout   .  Labs:  Lab Results  Component Value Date   WBC 10.1 06/09/2020   HGB 13.5 06/09/2020   HCT 40.5 06/09/2020   MCV 90.4 06/09/2020   PLT 497 (H) 06/09/2020    Lab Results  Component Value Date   CREATININE 0.60 06/09/2020   BUN 19 06/09/2020   NA 137 06/09/2020   K 4.5 06/09/2020   CL 104 06/09/2020   CO2 22 06/09/2020    Lab Results  Component Value Date   ALT 56 (H) 06/09/2020   AST 29 06/09/2020   ALKPHOS 103 06/09/2020   BILITOT 0.5 06/09/2020     Assessment:   Healthy female exam. History of recurrent UTI's Contraception counseling Nexplanon insertion Plan:    - Labs: Labs reviewed, up to date. - Breast self exam technique reviewed and patient encouraged to perform self-exam monthly. - Contraception: progesterone-only OCPs currently.  Desires to discuss options. Reviewed all  forms of birth control options available including abstinence; fertility period awareness methods; over the counter/barrier methods; hormonal contraceptive medication including pill, patch, ring, injection,contraceptive implant; hormonal and nonhormonal IUDs; permanent sterilization options including vasectomy and the various tubal sterilization modalities were not discussed. Risks and benefits reviewed.  Questions were answered. Patient desires to proceed with Nexplanon. Will insert today (see  procedure note below).  - Discussed healthy lifestyle modifications. - Pap smear performed today.  - COVID vaccination series completed.  Is eligible for booster.  - Has had Gardasil series.  - H/o recurrent UTI's.  - Follow up in 1 year for annual exam.     Nexplanon Insertion Procedure Patient identified, informed consent performed, consent signed.   Patient does understand that irregular bleeding is a very common side effect of this medication. She was advised to have backup contraception for one week after placement. Pregnancy test in clinic today was negative.  Appropriate time out taken.  Patient's left arm was prepped and draped in the usual sterile fashion. The ruler used to measure and mark insertion area.  Patient was prepped with alcohol swab and then injected with 3 ml of 1% lidocaine.  She was prepped with betadine, Nexplanon removed from packaging,  Device confirmed in needle, then inserted full length of needle and withdrawn per handbook instructions. Nexplanon was able to palpated in the patient's arm; patient palpated the insert herself. There was minimal blood loss.  Patient insertion site covered with guaze and a pressure bandage to reduce any bruising.  The patient tolerated the procedure well and was given post procedure instructions.    Lot: [id number] Exp: 10/20/2022   Rubie Maid, MD [facility]

## 2020-08-25 LAB — CYTOLOGY - PAP: Diagnosis: NEGATIVE

## 2020-08-29 ENCOUNTER — Telehealth: Payer: Self-pay | Admitting: Family Medicine

## 2020-08-29 DIAGNOSIS — G43109 Migraine with aura, not intractable, without status migrainosus: Secondary | ICD-10-CM

## 2020-08-29 MED ORDER — PROPRANOLOL HCL 40 MG PO TABS: ORAL_TABLET | ORAL | 1 refills | Status: DC

## 2020-08-29 NOTE — Telephone Encounter (Signed)
Patient states she called a month ago regarding a medication follow up appointment and was advised she would hear back in 2 weeks to schedule an appointment. Patient states no one called her back and now she is out of her propranolol (INDERAL) 40 MG tablet and would like request expedited. Patient scheduled next available appointment with Simona Huh on 09/06/2020. Please follow up with patient at [phone].    [id number] [address], [address] [place] Phone:  [phone]  Fax:  [phone]

## 2020-08-29 NOTE — Telephone Encounter (Signed)
[facility] faxed refill request for the following medications:  propranolol (INDERAL) 40 MG tablet  Last Rx: 03/22/20 90 day supply LOV: 06/09/20 Please advise. Thanks TNP

## 2020-09-06 ENCOUNTER — Other Ambulatory Visit: Payer: Self-pay

## 2020-09-06 ENCOUNTER — Encounter: Payer: Self-pay | Admitting: Family Medicine

## 2020-09-06 ENCOUNTER — Ambulatory Visit (INDEPENDENT_AMBULATORY_CARE_PROVIDER_SITE_OTHER): Payer: 59 | Admitting: Family Medicine

## 2020-09-06 VITALS — BP 117/90 | HR 71 | Wt 122.0 lb

## 2020-09-06 DIAGNOSIS — F909 Attention-deficit hyperactivity disorder, unspecified type: Secondary | ICD-10-CM

## 2020-09-06 DIAGNOSIS — G43809 Other migraine, not intractable, without status migrainosus: Secondary | ICD-10-CM

## 2020-09-06 DIAGNOSIS — F419 Anxiety disorder, unspecified: Secondary | ICD-10-CM | POA: Diagnosis not present

## 2020-09-06 DIAGNOSIS — F32A Depression, unspecified: Secondary | ICD-10-CM

## 2020-09-06 MED ORDER — SERTRALINE HCL 100 MG PO TABS: ORAL_TABLET | ORAL | 3 refills | Status: DC

## 2020-09-06 NOTE — Progress Notes (Signed)
Established patient visit   Patient: Eileen Davis   DOB: 01-Nov-1990   30 y.o. Female  MRN: [id number] Visit Date: 09/06/2020  Today's healthcare provider: Vernie Murders, PA-C   Chief Complaint  Patient presents with  . Migraine  . ADHD   Subjective    Migraine  This is a recurrent problem. The problem has been unchanged. The pain quality is similar to prior headaches. Pertinent negatives include no dizziness. She has tried beta blockers for the symptoms. The treatment provided significant relief.    Follow up for ADHD  The patient was last seen for this 6 months ago. Changes made at last visit include no changes.  She reports excellent compliance with treatment. She feels that condition is stable. She is not having side effects.   -----------------------------------------------------------------------------------------    Patient Active Problem List   Diagnosis Date Noted  . Pyelonephritis 06/09/2020  . Follow-up exam 06/09/2020  . E. coli infection 06/09/2020  . SVT (supraventricular tachycardia) ([place]) 06/01/2020  . Cystitis 06/01/2020  . RLQ abdominal pain 06/01/2020  . Anxiety and depression 09/18/2018  . ADHD 05/02/2018  . Migraine 09/26/2017  . History of ovarian cyst 04/25/2016  . Recurrent UTI 06/02/2015  . Urinary tract infection without hematuria 03/01/2015  . Vesicoureteral-reflux, unspecified 03/01/2015   Past Medical History:  Diagnosis Date  . ADHD   . Anxiety and depression   . Depression   . Migraine   . Recurrent UTI   . Ureteral reflux 2005   Right side   Family History  Problem Relation Age of Onset  . Osteoporosis Mother   . Diabetes Father   . Hypertension Father   . Healthy Brother   . ADD / ADHD Brother   . Ovarian cancer Maternal Grandmother   . Prostate cancer Maternal Grandfather   . Congestive Heart Failure Paternal Grandmother   . CVA Paternal Grandmother   . Lung cancer Paternal Grandfather     Social  History   Tobacco Use  . Smoking status: Former Smoker    Packs/day: 0.00  . Smokeless tobacco: Never Used  Vaping Use  . Vaping Use: Former  Substance Use Topics  . Alcohol use: Yes    Comment: Rarely  . Drug use: No   Allergies  Allergen Reactions  . Bee Venom   . Latex      Medications: Outpatient Medications Prior to Visit  Medication Sig  . amphetamine-dextroamphetamine (ADDERALL XR) 20 MG 24 hr capsule Take 20 mg by mouth daily.  Marland Kitchen amphetamine-dextroamphetamine (ADDERALL) 20 MG tablet Take 1 tablet (20 mg total) by mouth 2 (two) times daily as needed.  . etonogestrel (NEXPLANON) 68 MG IMPL implant 1 each by Subdermal route once. Inserted 08/24/2020 by Rubie Maid, MD [facility] [place]  . ondansetron (ZOFRAN) 4 MG tablet Take 1 tablet (4 mg total) by mouth every 8 (eight) hours as needed for nausea or vomiting.  . propranolol (INDERAL) 40 MG tablet Take one pill twice daily.  . sertraline (ZOLOFT) 100 MG tablet TAKE 1 TABLET(100 MG) BY MOUTH DAILY  . traZODone (DESYREL) 50 MG tablet Take 0.5-1 tablets (25-50 mg total) by mouth at bedtime as needed for sleep.  . rizatriptan (MAXALT) 10 MG tablet Take 1 tablet (10 mg total) by mouth as needed for migraine. May repeat in 2 hours if needed   No facility-administered medications prior to visit.    Review of Systems  Constitutional: Negative.   Respiratory: Negative.   Cardiovascular: Negative.  Gastrointestinal: Negative.   Neurological: Negative for dizziness, light-headedness and headaches.        Objective    BP 117/90 (BP Location: Left Arm, Patient Position: Sitting, Cuff Size: Normal)   Pulse 71   Wt 122 lb (55.3 kg)   SpO2 99%   BMI 22.31 kg/m    Physical Exam Constitutional:      General: She is not in acute distress.    Appearance: She is well-developed.  HENT:     Head: Normocephalic and atraumatic.     Right Ear: Hearing and tympanic membrane normal.     Left Ear: Hearing and tympanic membrane  normal.     Nose: Nose normal.  Eyes:     General: Lids are normal. No scleral icterus.       Right eye: No discharge.        Left eye: No discharge.     Conjunctiva/sclera: Conjunctivae normal.  Cardiovascular:     Rate and Rhythm: Normal rate and regular rhythm.     Pulses: Normal pulses.     Heart sounds: Normal heart sounds.  Pulmonary:     Effort: Pulmonary effort is normal. No respiratory distress.     Breath sounds: Normal breath sounds.  Abdominal:     General: Bowel sounds are normal.     Palpations: Abdomen is soft.  Musculoskeletal:        General: Normal range of motion.     Cervical back: Normal range of motion and neck supple.  Skin:    Findings: No lesion or rash.  Neurological:     Mental Status: She is alert and oriented to person, place, and time.  Psychiatric:        Speech: Speech normal.        Behavior: Behavior normal.        Thought Content: Thought content normal.       No results found for any visits on 09/06/20.  Assessment & Plan     1. 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 No weight loss or migraine triggered with use of these meds. Diagnosed as an adult by psychiatrist. Recheck labs. - CBC with Differential/Platelet - Comprehensive metabolic panel - TSH  2. Anxiety and depression Well controlled with the Zoloft. Has not had any psychiatry follow up in several months. Refilled meds and get routine labs. - sertraline (ZOLOFT) 100 MG tablet; TAKE 1 TABLET(100 MG) BY MOUTH DAILY  Dispense: 30 tablet; Refill: 3 - CBC with Differential/Platelet - Comprehensive metabolic panel - TSH  3. Other migraine without status migrainosus, not intractable No migraines in the past several months with using Propranolol daily. Has not had to use any of the Maxalt. Get follow up labs. - CBC with  Differential/Platelet - Comprehensive metabolic panel - TSH   No follow-ups on file.      I, Cadance Raus, PA-C, have reviewed all documentation for this visit. The documentation on 09/06/20 for the exam, diagnosis, procedures, and orders are all accurate and complete.    Vernie Murders, PA-C  [employer] [phone] (phone) [phone] (fax)  [place]

## 2020-09-07 LAB — CBC WITH DIFFERENTIAL/PLATELET
Basophils Absolute: 0.1 10*3/uL (ref 0.0–0.2)
Basos: 1 %
EOS (ABSOLUTE): 0.2 10*3/uL (ref 0.0–0.4)
Eos: 3 %
Hematocrit: 42 % (ref 34.0–46.6)
Hemoglobin: 14.1 g/dL (ref 11.1–15.9)
Immature Grans (Abs): 0 10*3/uL (ref 0.0–0.1)
Immature Granulocytes: 0 %
Lymphocytes Absolute: 2.7 10*3/uL (ref 0.7–3.1)
Lymphs: 38 %
MCH: 30.7 pg (ref 26.6–33.0)
MCHC: 33.6 g/dL (ref 31.5–35.7)
MCV: 91 fL (ref 79–97)
Monocytes Absolute: 0.6 10*3/uL (ref 0.1–0.9)
Monocytes: 9 %
Neutrophils Absolute: 3.5 10*3/uL (ref 1.4–7.0)
Neutrophils: 49 %
Platelets: 207 10*3/uL (ref 150–450)
RBC: 4.6 x10E6/uL (ref 3.77–5.28)
RDW: 12 % (ref 11.7–15.4)
WBC: 7.1 10*3/uL (ref 3.4–10.8)

## 2020-09-07 LAB — COMPREHENSIVE METABOLIC PANEL
ALT: 11 IU/L (ref 0–32)
AST: 15 IU/L (ref 0–40)
Albumin/Globulin Ratio: 2 (ref 1.2–2.2)
Albumin: 4.4 g/dL (ref 3.9–5.0)
Alkaline Phosphatase: 117 IU/L (ref 44–121)
BUN/Creatinine Ratio: 9 (ref 9–23)
BUN: 10 mg/dL (ref 6–20)
Bilirubin Total: 0.4 mg/dL (ref 0.0–1.2)
CO2: 21 mmol/L (ref 20–29)
Calcium: 9 mg/dL (ref 8.7–10.2)
Chloride: 104 mmol/L (ref 96–106)
Creatinine, Ser: 1.1 mg/dL — ABNORMAL HIGH (ref 0.57–1.00)
Globulin, Total: 2.2 g/dL (ref 1.5–4.5)
Glucose: 103 mg/dL — ABNORMAL HIGH (ref 65–99)
Potassium: 4.5 mmol/L (ref 3.5–5.2)
Sodium: 139 mmol/L (ref 134–144)
Total Protein: 6.6 g/dL (ref 6.0–8.5)
eGFR: 70 mL/min/{1.73_m2} (ref >59)

## 2020-09-07 LAB — TSH: TSH: 2.76 u[IU]/mL (ref 0.450–4.500)

## 2020-11-10 ENCOUNTER — Ambulatory Visit: Payer: 59 | Admitting: Family Medicine

## 2020-11-10 ENCOUNTER — Encounter: Payer: Self-pay | Admitting: Family Medicine

## 2020-11-10 ENCOUNTER — Other Ambulatory Visit: Payer: Self-pay

## 2020-11-10 VITALS — BP 106/80 | HR 65 | Temp 98.1°F | Resp 16 | Wt 119.5 lb

## 2020-11-10 DIAGNOSIS — F909 Attention-deficit hyperactivity disorder, unspecified type: Secondary | ICD-10-CM

## 2020-11-10 DIAGNOSIS — F419 Anxiety disorder, unspecified: Secondary | ICD-10-CM | POA: Diagnosis not present

## 2020-11-10 DIAGNOSIS — I471 Supraventricular tachycardia: Secondary | ICD-10-CM | POA: Diagnosis not present

## 2020-11-10 DIAGNOSIS — G4700 Insomnia, unspecified: Secondary | ICD-10-CM | POA: Diagnosis not present

## 2020-11-10 DIAGNOSIS — G43109 Migraine with aura, not intractable, without status migrainosus: Secondary | ICD-10-CM

## 2020-11-10 DIAGNOSIS — F32A Depression, unspecified: Secondary | ICD-10-CM

## 2020-11-10 MED ORDER — TRAZODONE HCL 50 MG PO TABS: 25.0000 mg | ORAL_TABLET | Freq: Every evening | ORAL | 3 refills | Status: AC | PRN

## 2020-11-10 MED ORDER — AMPHETAMINE-DEXTROAMPHET ER 20 MG PO CP24: 20.0000 mg | ORAL_CAPSULE | Freq: Every day | ORAL | 0 refills | Status: DC

## 2020-11-10 MED ORDER — PROPRANOLOL HCL 40 MG PO TABS: ORAL_TABLET | ORAL | 1 refills | Status: DC

## 2020-11-10 NOTE — Assessment & Plan Note (Addendum)
Chronic and stable Continue Adderall XR at current dose for shifts at work (does not take when not working) and non-extended release for shorter shifts Encouraged her that she can take XR daily Diagnosed by psych as adult Advised q18m f/u

## 2020-11-10 NOTE — Assessment & Plan Note (Signed)
Chronic and stable  Continue propranolol

## 2020-11-10 NOTE — Assessment & Plan Note (Signed)
Chronic and stable Continue trazodone prn

## 2020-11-10 NOTE — Progress Notes (Signed)
Established patient visit   Patient: Eileen Davis   DOB: [birth year]   30 y.o. Female  MRN: [id number] Visit Date: 11/10/2020  Today's healthcare provider: Lavon Paganini, MD   Chief Complaint  Patient presents with   ADHD   Anxiety   Subjective    Anxiety Patient reports no chest pain, dizziness, nausea, palpitations or shortness of breath.     Follow up for ADHD  The patient was last seen for this 2 months ago. Changes made at last visit include no changes.  She reports excellent compliance with treatment. Patient reports that she takes XR only when she is at work, reports good compliance on both doses. She uses 20 mg adderall when she has to be stationary and needs to focus.  She reports she functions well off the medication however her husband believes she needs to medicate outside of work.   She feels that condition is Improved. She is not having side effects.  She is requesting for refills on 50 mg trazodone and XR.   ----------------------------------------------------------------------------------------- Anxiety, Follow-up  She was last seen for anxiety 2 months ago. Changes made at last visit include no changes.   She reports excellent compliance with treatment. She reports excellent tolerance of treatment. She is not having side effects.  She feels her anxiety is mild and Unchanged since last visit.  Symptoms: No chest pain Yes difficulty concentrating  Yes dizziness Yes fatigue  Yes feelings of losing control Yes insomnia  No irritable Yes palpitations  No panic attacks No racing thoughts  Yes shortness of breath Yes sweating  No tremors/shakes    GAD-7 Results GAD-7 Generalized Anxiety Disorder Screening Tool 05/13/2019  1. Feeling Nervous, Anxious, or on Edge 0  2. Not Being Able to Stop or Control Worrying 0  3. Worrying Too Much About Different Things 0  4. Trouble Relaxing 1  5. Being So Restless it's Hard To Sit Still 0  6.  Becoming Easily Annoyed or Irritable 0  7. Feeling Afraid As If Something Awful Might Happen 0  Total GAD-7 Score 1  Difficulty At Work, Home, or Getting  Along With Others? 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   ---------------------------------------------------------------------------------------------------   Patient Active Problem List   Diagnosis Date Noted   Insomnia 11/10/2020   Pyelonephritis 06/09/2020   SVT (supraventricular tachycardia) ([place]) 06/01/2020   Anxiety and depression 09/18/2018   ADHD 05/02/2018   Migraine 09/26/2017   History of ovarian cyst 04/25/2016   Recurrent UTI 06/02/2015   Vesicoureteral-reflux, unspecified 03/01/2015   Social History   Tobacco Use   Smoking status: Former    Packs/day: 0.00    Types: Cigarettes   Smokeless tobacco: Never  Vaping Use   Vaping Use: Former  Substance Use Topics   Alcohol use: Yes    Comment: Rarely   Drug use: No   Allergies  Allergen Reactions   Bee Venom    Latex                 Medications: Outpatient Medications Prior to Visit  Medication Sig   amphetamine-dextroamphetamine (ADDERALL) 20 MG tablet Take 1 tablet (20 mg total) by mouth 2 (two) times daily as needed.   etonogestrel (NEXPLANON) 68 MG IMPL implant 1 each by Subdermal route once. Inserted 08/24/2020 by Dolphus Jenny  Marcelline Mates, MD [facility] [place]   ondansetron (ZOFRAN) 4 MG tablet Take 1 tablet (4 mg total) by mouth every 8 (eight) hours as needed for nausea or vomiting.   sertraline (ZOLOFT) 100 MG tablet TAKE 1 TABLET(100 MG) BY MOUTH DAILY   [DISCONTINUED] amphetamine-dextroamphetamine (ADDERALL XR) 20 MG 24 hr capsule Take 20 mg by mouth daily.   [DISCONTINUED] propranolol (INDERAL) 40 MG tablet Take one pill twice daily.   [DISCONTINUED] traZODone (DESYREL) 50 MG tablet Take 0.5-1 tablets  (25-50 mg total) by mouth at bedtime as needed for sleep.   No facility-administered medications prior to visit.   Review of Systems  Constitutional:  Negative for chills, fatigue and fever.  HENT:  Negative for ear pain, sinus pressure, sinus pain and sore throat.   Eyes:  Negative for pain and visual disturbance.  Respiratory:  Negative for cough, chest tightness, shortness of breath and wheezing.   Cardiovascular:  Negative for chest pain, palpitations and leg swelling.  Gastrointestinal:  Negative for abdominal pain, blood in stool, diarrhea, nausea and vomiting.  Genitourinary:  Negative for flank pain, frequency, pelvic pain and urgency.  Musculoskeletal:  Negative for back pain, myalgias and neck pain.  Neurological:  Negative for dizziness, weakness, light-headedness, numbness and headaches.       Objective    BP 106/80   Pulse 65   Temp 98.1 F (36.7 C) (Oral)   Resp 16   Wt 119 lb 8 oz (54.2 kg)   SpO2 98%   BMI 21.86 kg/m  BP Readings from Last 3 Encounters:  11/10/20 106/80  09/06/20 117/90  08/24/20 122/80   Wt Readings from Last 3 Encounters:  11/10/20 119 lb 8 oz (54.2 kg)  09/06/20 122 lb (55.3 kg)  08/24/20 122 lb 14.4 oz (55.7 kg)     Physical Exam Vitals reviewed.  Constitutional:      General: She is not in acute distress.    Appearance: Normal appearance. She is well-developed. She is not diaphoretic.  HENT:     Head: Normocephalic and atraumatic.  Eyes:     General: No scleral icterus.    Conjunctiva/sclera: Conjunctivae normal.  Neck:     Thyroid: No thyromegaly.  Cardiovascular:     Rate and Rhythm: Normal rate and regular rhythm.     Pulses: Normal pulses.     Heart sounds: Normal heart sounds. No murmur heard. Pulmonary:     Effort: Pulmonary effort is normal. No respiratory distress.     Breath sounds: Normal breath sounds. No wheezing, rhonchi or rales.  Musculoskeletal:     Cervical back: Neck supple.     Right lower leg: No  edema.     Left lower leg: No edema.  Lymphadenopathy:     Cervical: No cervical adenopathy.  Skin:    General: Skin is warm and dry.     Findings: No rash.  Neurological:     Mental Status: She is alert and oriented to person, place, and time. Mental status is at baseline.  Psychiatric:        Mood and Affect: Mood normal.        Behavior: Behavior normal.     No results found for any visits on 11/10/20.  Assessment & Plan     Problem List Items Addressed This Visit       Cardiovascular and Mediastinum   Migraine   Relevant Medications   traZODone (DESYREL) 50 MG tablet   propranolol (INDERAL) 40 MG tablet   SVT (  supraventricular tachycardia) (H[facility])    Chronic and stable Continue propranolol        Relevant Medications   propranolol (INDERAL) 40 MG tablet     Other   ADHD - Primary    Chronic and stable Continue Adderall XR at current dose for shifts at work (does not take when not working) and non-extended release for shorter shifts Encouraged her that she can take XR daily Diagnosed by psych as adult Advised q81m f/u       Anxiety and depression    Chronic and stable Continue Zoloft daily at current dose       Relevant Medications   traZODone (DESYREL) 50 MG tablet   Insomnia    Chronic and stable Continue trazodone prn       Relevant Medications   traZODone (DESYREL) 50 MG tablet    Return in about 4 months (around 03/13/2021) for ADD f/u, chronic disease f/u, virtual ok, With new PCP.      I,Essence Turner,acting as a Education administrator for Lavon Paganini, MD.,have documented all relevant documentation on the behalf of Lavon Paganini, MD,as directed by  Lavon Paganini, MD while in the presence of Lavon Paganini, MD.   I, Lavon Paganini, MD, have reviewed all documentation for this visit. The documentation on 11/10/20 for the exam, diagnosis, procedures, and orders are all accurate and complete.   Arnisha Laffoon, Dionne Bucy, MD, MPH [facility]

## 2020-11-10 NOTE — Assessment & Plan Note (Signed)
Chronic and stable Continue Zoloft daily at current dose

## 2021-01-11 ENCOUNTER — Other Ambulatory Visit: Payer: Self-pay | Admitting: Family Medicine

## 2021-01-12 MED ORDER — AMPHETAMINE-DEXTROAMPHET ER 20 MG PO CP24: 20.0000 mg | ORAL_CAPSULE | Freq: Every day | ORAL | 0 refills | Status: DC

## 2021-01-12 NOTE — Telephone Encounter (Signed)
LOV:  11/10/2020 NOV: 03/14/2021  Last Refill: 11/10/2020 #30 0 Refills.    Thanks,   -Mickel Baas

## 2021-03-10 ENCOUNTER — Other Ambulatory Visit: Payer: Self-pay | Admitting: Family Medicine

## 2021-03-13 MED ORDER — AMPHETAMINE-DEXTROAMPHET ER 20 MG PO CP24: 20.0000 mg | ORAL_CAPSULE | Freq: Every day | ORAL | 0 refills | Status: DC

## 2021-03-14 ENCOUNTER — Encounter: Payer: Self-pay | Admitting: Family Medicine

## 2021-03-14 ENCOUNTER — Ambulatory Visit (INDEPENDENT_AMBULATORY_CARE_PROVIDER_SITE_OTHER): Payer: 59 | Admitting: Family Medicine

## 2021-03-14 ENCOUNTER — Other Ambulatory Visit: Payer: Self-pay

## 2021-03-14 DIAGNOSIS — G43809 Other migraine, not intractable, without status migrainosus: Secondary | ICD-10-CM

## 2021-03-14 DIAGNOSIS — F909 Attention-deficit hyperactivity disorder, unspecified type: Secondary | ICD-10-CM | POA: Diagnosis not present

## 2021-03-14 DIAGNOSIS — I471 Supraventricular tachycardia, unspecified: Secondary | ICD-10-CM

## 2021-03-14 DIAGNOSIS — F419 Anxiety disorder, unspecified: Secondary | ICD-10-CM

## 2021-03-14 DIAGNOSIS — F4329 Adjustment disorder with other symptoms: Secondary | ICD-10-CM | POA: Insufficient documentation

## 2021-03-14 DIAGNOSIS — F32A Depression, unspecified: Secondary | ICD-10-CM

## 2021-03-14 MED ORDER — SERTRALINE HCL 100 MG PO TABS: ORAL_TABLET | ORAL | 0 refills | Status: DC

## 2021-03-14 NOTE — Assessment & Plan Note (Signed)
Dx'd as adult; takes for work Continued q4 month appts Does not take when not working Dose is stable Denies difficulty falling asleep  Denies headaches outside of lack of sleep migraines  Denies lack of appetite

## 2021-03-14 NOTE — Assessment & Plan Note (Signed)
Chronic, stable Two episodes since last appt in July Dose related to BID timing of BB Both noted at work Once with extra dose of caffeine in coffee and the other when over heated and most likely not hydrated

## 2021-03-14 NOTE — Assessment & Plan Note (Signed)
Mom with recent dx of gastric CA; requesting genetic testing  Plans to follow up with mom's oncologist to figure out if they are offering testing or if they will give her names of what tests need to be performed Has intermittent FMLA if she needs it

## 2021-03-14 NOTE — Assessment & Plan Note (Signed)
Chronic, stable Had a back log of zoloft, request refill at this time

## 2021-03-14 NOTE — Progress Notes (Signed)
Virtual telephone visit    Virtual Visit via Telephone Note   This visit type was conducted due to national recommendations for restrictions regarding the COVID-19 Pandemic (e.g. social distancing) in an effort to limit this patient's exposure and mitigate transmission in our community. Due to her co-morbid illnesses, this patient is at least at moderate risk for complications without adequate follow up. This format is felt to be most appropriate for this patient at this time. The patient did not have access to video technology or had technical difficulties with video requiring transitioning to audio format only (telephone). Physical exam was limited to content and character of the telephone converstion.    Patient location: in vehicle, just got off work Provider location: [employer]  I discussed the limitations of evaluation and management by telemedicine and the availability of in person appointments. The patient expressed understanding and agreed to proceed.   Visit Date: 03/14/2021  Today's healthcare provider: [name], FNP   Chief Complaint  Patient presents with   ADHD   Anxiety   Subjective    HPI  Follow up for ADHD  The patient was last seen for this 4 months ago. Changes made at last visit include Chronic and stable Continue Adderall XR at current dose for shifts at work (does not take when not working) and non-extended release for shorter shifts.  She reports excellent compliance with treatment. She feels that condition is Improved. She is not having side effects.   -----------------------------------------------------------------------------------------   Anxiety/Depression, Follow-up  She was last seen for anxiety 4 months ago. Changes made at last visit include none.   She reports excellent compliance with treatment. She reports good tolerance of treatment. She is not having side effects.   She feels her anxiety is mild and  Improved since last visit.  Symptoms: Yes chest pain No difficulty concentrating  No dizziness Yes fatigue  No feelings of losing control No insomnia  No irritable No palpitations  No panic attacks No racing thoughts  No shortness of breath No sweating  No tremors/shakes    GAD-7 Results GAD-7 Generalized Anxiety Disorder Screening Tool 05/13/2019  1. Feeling Nervous, Anxious, or on Edge 0  2. Not Being Able to Stop or Control Worrying 0  3. Worrying Too Much About Different Things 0  4. Trouble Relaxing 1  5. Being So Restless it's Hard To Sit Still 0  6. Becoming Easily Annoyed or Irritable 0  7. Feeling Afraid As If Something Awful Might Happen 0  Total GAD-7 Score 1  Difficulty At Work, Home, or Getting  Along With Others? Somewhat difficult    PHQ-9 Scores PHQ9 SCORE ONLY 09/06/2020 01/03/2017 01/03/2017  PHQ-9 Total Score 0 2 1    ---------------------------------------------------------------------------------------------------   Medications: Outpatient Medications Prior to Visit  Medication Sig   amphetamine-dextroamphetamine (ADDERALL XR) 20 MG 24 hr capsule Take 1 capsule (20 mg total) by mouth daily.   etonogestrel (NEXPLANON) 68 MG IMPL implant 1 each by Subdermal route once. Inserted 08/24/2020 by Rubie Maid, MD [facility] [place]   ondansetron (ZOFRAN) 4 MG tablet Take 1 tablet (4 mg total) by mouth every 8 (eight) hours as needed for nausea or vomiting.   propranolol (INDERAL) 40 MG tablet Take one pill twice daily.   traZODone (DESYREL) 50 MG tablet Take 0.5-1 tablets (25-50 mg total) by mouth at bedtime as needed for sleep.   [DISCONTINUED] amphetamine-dextroamphetamine (ADDERALL XR) 20 MG 24 hr capsule Take 1 capsule (20 mg total)  by mouth daily.   [DISCONTINUED] amphetamine-dextroamphetamine (ADDERALL XR) 20 MG 24 hr capsule Take 1 capsule (20 mg total) by mouth daily.   [DISCONTINUED] amphetamine-dextroamphetamine (ADDERALL) 20 MG tablet Take 1 tablet (20 mg  total) by mouth 2 (two) times daily as needed.   [DISCONTINUED] sertraline (ZOLOFT) 100 MG tablet TAKE 1 TABLET(100 MG) BY MOUTH DAILY   No facility-administered medications prior to visit.    Review of Systems    Objective    There were no vitals taken for this visit.     Assessment & Plan     Problem List Items Addressed This Visit       Cardiovascular and Mediastinum   Migraine    Related to lack of sleep, shift worker Stable, does not interfere with ADLs after taking a nap/getting more sleep      Relevant Medications   sertraline (ZOLOFT) 100 MG tablet   SVT (supraventricular tachycardia) ([facility]CC) - Primary    Chronic, stable Two episodes since last appt in July Dose related to BID timing of BB Both noted at work Once with extra dose of caffeine in coffee and the other when over heated and most likely not hydrated        Other   ADHD    Dx'd as adult; takes for work Continued q4 month appts Does not take when not working Dose is stable Denies difficulty falling asleep  Denies headaches outside of lack of sleep migraines  Denies lack of appetite      Anxiety and depression    Chronic, stable Had a back log of zoloft, request refill at this time      Relevant Medications   sertraline (ZOLOFT) 100 MG tablet   Stress and adjustment reaction    Mom with recent dx of gastric CA; requesting genetic testing  Plans to follow up with mom's oncologist to figure out if they are offering testing or if they will give her names of what tests need to be performed Has intermittent FMLA if she needs it        Return in about 3 months (around 06/14/2021) for anxiety and depression.    I discussed the assessment and treatment plan with the patient. The patient was provided an opportunity to ask questions and all were answered. The patient agreed with the plan and demonstrated an understanding of the instructions.   The patient was advised to call back or seek an  in-person evaluation if the symptoms worsen or if the condition fails to improve as anticipated.  I provided 11 minutes of non-face-to-face time during this encounter.  Vonna Kotyk, FNP, have reviewed all documentation for this visit. The documentation on 03/14/21 for the exam, diagnosis, procedures, and orders are all accurate and complete.   [name], [place] [phone] (phone) [phone] (fax)  [place]

## 2021-03-14 NOTE — Assessment & Plan Note (Signed)
Related to lack of sleep, shift worker Stable, does not interfere with ADLs after taking a nap/getting more sleep

## 2021-05-04 ENCOUNTER — Other Ambulatory Visit: Payer: Self-pay | Admitting: Family Medicine

## 2021-05-05 MED ORDER — AMPHETAMINE-DEXTROAMPHET ER 20 MG PO CP24
20.0000 mg | ORAL_CAPSULE | Freq: Every day | ORAL | 0 refills | Status: DC
Start: 2021-05-05 — End: 2021-07-11

## 2021-07-11 ENCOUNTER — Other Ambulatory Visit: Payer: Self-pay | Admitting: Family Medicine

## 2021-07-11 MED ORDER — AMPHETAMINE-DEXTROAMPHET ER 20 MG PO CP24: 20.0000 mg | ORAL_CAPSULE | Freq: Every day | ORAL | 0 refills | Status: DC

## 2021-07-19 NOTE — Progress Notes (Signed)
?  ? ?[employer] as a Education administrator for [name], FNP.,have documented all relevant documentation on the behalf of [name], FNP,as directed by  [name], FNP while in the presence of [name], FNP.  ? ?Established patient visit ? ? ?Patient: Eileen Davis   DOB: 01-Jun-1990   31 y.o. Female  MRN: [id number] ?Visit Date: 07/20/2021 ? ?Today's healthcare provider: [name], FNP  ?Re Introduced to nurse practitioner role and practice setting.  All questions answered.  Discussed provider/patient relationship and expectations. ? ? ?Chief Complaint  ?Patient presents with  ? Breast Mass  ?  Patient presents in office today with concerns of breast mass or lump on the left breast around 4 o'clock. Patient states that area in question is hot to touch and red and states that mass is a size of a quarter. Patient denies nipple discharge, patient states that mass/lump does move slightly when pressed. Patient has tried placing warm compress with no relief but states that she has relief when placing cold compress. 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   ? ?Subjective  ?  ?HPI ?HPI   ? ? Breast Mass   ? Additional comments: Patient presents in office today with concerns of breast mass or lump on the left breast around 4 o'clock. Patient states that area in question is hot to touch and red and states that mass is a size of a quarter. Patient denies nipple discharge, patient states that mass/lump does move slightly when pressed. Patient has tried placing warm compress with no relief but states that she has relief when placing cold compress. 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  ? ?  ?  ?Last edited by Minette Headland, CMA on 07/20/2021  3:13 PM.  ?  ?  ?Breast Mass: Patient presents for evaluation of a breast mass. Change was noted >7 days ago. Patient does routinely do self breast exams.  Patient has noted a  change on breast exam. Breast cancer risk factors include nulliparous.   Age of menarche is unknown.  Last menstrual period was within months; pt with nexplanon. Patient denies nipple discharge. Patient denies to previous breast biopsy. Patient denies a personal history of breast cancer.  ? ?Medications: ?Outpatient Medications Prior to Visit  ?Medication Sig  ? amphetamine-dextroamphetamine (ADDERALL XR) 20 MG 24 hr capsule Take 1 capsule (20 mg total) by mouth daily.  ? etonogestrel (NEXPLANON) 68 MG IMPL implant 1 each by Subdermal route once. Inserted 08/24/2020 by Rubie Maid, MD [facility] [place]  ? ondansetron (ZOFRAN) 4 MG tablet Take 1 tablet (4 mg total) by mouth every 8 (eight) hours as needed for nausea or vomiting.  ? propranolol (INDERAL) 40 MG tablet Take one pill twice daily.  ? sertraline (ZOLOFT) 100 MG tablet TAKE 1 TABLET(100 MG) BY MOUTH DAILY  ? traZODone (DESYREL) 50 MG tablet Take 0.5-1 tablets (25-50 mg total) by mouth at bedtime as needed for sleep.  ? ?No facility-administered medications prior to visit.  ? ? ?Review of Systems ? ? ?  Objective  ?  ?BP 120/80 Comment: home reading  Pulse 88   Temp 97.9 ?F (36.6 ?C) (Temporal)   Resp 15   Wt 128 lb 11.2 oz (58.4 kg)   LMP  (Within Months)   BMI 23.54 kg/m?  ? ? ?Physical Exam ?Vitals and nursing note reviewed.  ?Constitutional:   ?  General: She is not in acute distress. ?   Appearance: Normal appearance. She is normal weight. She is not ill-appearing, toxic-appearing or diaphoretic.  ?HENT:  ?   Head: Normocephalic and atraumatic.  ?Cardiovascular:  ?   Rate and Rhythm: Normal rate and regular rhythm.  ?   Pulses: Normal pulses.  ?   Heart sounds: Normal heart sounds. No murmur heard. ?  No friction rub. No gallop.  ?Pulmonary:  ?   Effort: Pulmonary effort is normal. No respiratory distress.  ?   Breath sounds: Normal breath sounds. No stridor. No wheezing, rhonchi or rales.  ?Chest:  ?   Chest wall: No tenderness.  ?Breasts: ?    Tanner Score is 5.  ?   Breasts are symmetrical.  ?   Right: Normal.  ?   Left: Mass, skin change and tenderness present.  ? ? ?   Comments: Breasts: breasts appear normal, no suspicious masses, no skin or nipple changes or axillary nodes, symmetric fibrous changes in both upper outer quadrants, right breast normal without mass, skin or nipple changes or axillary nodes, abnormal mass palpable 2 cm mass, movable, 1 cm from nipple line at 3 o'clock position on L breast, bilateral nipple piercing in place- have been in place close to 1 year ? ?Abdominal:  ?   General: Bowel sounds are normal.  ?   Palpations: Abdomen is soft.  ?Musculoskeletal:     ?   General: No swelling, tenderness, deformity or signs of injury. Normal range of motion.  ?   Right lower leg: No edema.  ?   Left lower leg: No edema.  ?Lymphadenopathy:  ?   Upper Body:  ?   Right upper body: No supraclavicular, axillary or pectoral adenopathy.  ?   Left upper body: No supraclavicular, axillary or pectoral adenopathy.  ?Skin: ?   General: Skin is warm and dry.  ?   Capillary Refill: Capillary refill takes less than 2 seconds.  ?   Coloration: Skin is not jaundiced or pale.  ?   Findings: No bruising, erythema, lesion or rash.  ?Neurological:  ?   General: No focal deficit present.  ?   Mental Status: She is alert and oriented to person, place, and time. Mental status is at baseline.  ?   Cranial Nerves: No cranial nerve deficit.  ?   Sensory: No sensory deficit.  ?   Motor: No weakness.  ?   Coordination: Coordination normal.  ?Psychiatric:     ?   Attention and Perception: Attention and perception normal.     ?   Mood and Affect: Mood is anxious.     ?   Speech: Speech is rapid and pressured.     ?   Behavior: Behavior normal.     ?   Thought Content: Thought content normal.     ?   Cognition and Memory: Cognition normal.     ?   Judgment: Judgment normal.  ?  ? ?No results found for any visits on 07/20/21. ? Assessment & Plan  ?  ? ?Problem List Items  Addressed This Visit   ? ?  ? Other  ? Attention deficit hyperactivity disorder (ADHD)  ?  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 ?Hx of SVT; not a concern at this time, previous  episodes noted with overheated and associated use of caffeine  ?Continue medication for ADHD with 6 month f/u recommended ?  ?  ? Breast pain, left - Primary  ?  First noted symptoms on 3/26 ?-has been working out with same routine for past 3 weeks ?-redness with streaking noted at site of mass ?-mass appears cystlike, roughly 2 cm x 1 cm, 1 cm from nipple line to axilla at 3 o clock position ?-has improved with use of topical ice packs and use of compressive sports bras ?-of note, pt does have nipple piercing that have been in place for >10 months ?  ?  ? Relevant Orders  ? US BREAST LTD UNI LEFT INC AXILLA  ? MM DIAG BREAST TOMO BILATERAL  ? ? ? ?Return in about 6 months (around 01/20/2022) for annual examination.  ?   ? ?I, [name], FNP, have reviewed all documentation for this visit. The documentation on 07/20/21 for the exam, diagnosis, procedures, and orders are all accurate and complete. ? ?[name], FNP  ?[place] ?[phone] (phone) ?[phone] (fax) ? ?[facility]  ?

## 2021-07-20 ENCOUNTER — Encounter: Payer: Self-pay | Admitting: Family Medicine

## 2021-07-20 ENCOUNTER — Ambulatory Visit (INDEPENDENT_AMBULATORY_CARE_PROVIDER_SITE_OTHER): Payer: 59 | Admitting: Family Medicine

## 2021-07-20 VITALS — BP 120/80 | HR 88 | Temp 97.9°F | Resp 15 | Wt 128.7 lb

## 2021-07-20 DIAGNOSIS — F909 Attention-deficit hyperactivity disorder, unspecified type: Secondary | ICD-10-CM | POA: Insufficient documentation

## 2021-07-20 DIAGNOSIS — N644 Mastodynia: Secondary | ICD-10-CM

## 2021-07-20 NOTE — Assessment & Plan Note (Signed)
First noted symptoms on 3/26 ?-has been working out with same routine for past 3 weeks ?-redness with streaking noted at site of mass ?-mass appears cystlike, roughly 2 cm x 1 cm, 1 cm from nipple line to axilla at 3 o clock position ?-has improved with use of topical ice packs and use of compressive sports bras ?-of note, pt does have nipple piercing that have been in place for >10 months ?

## 2021-07-20 NOTE — Assessment & Plan Note (Signed)
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 ?Hx of SVT; not a concern at this time, previous episodes noted with overheated and associated use of caffeine  ?Continue medication for ADHD with 6 month f/u recommended ?

## 2021-07-26 ENCOUNTER — Ambulatory Visit
Admission: RE | Admit: 2021-07-26 | Discharge: 2021-07-26 | Disposition: A | Discharge status: Home / Self Care | Payer: 59 | Source: Ambulatory Visit | Attending: Family Medicine | Admitting: Family Medicine

## 2021-07-26 DIAGNOSIS — N644 Mastodynia: Secondary | ICD-10-CM | POA: Diagnosis present

## 2021-07-26 MED ORDER — SULFAMETHOXAZOLE-TRIMETHOPRIM 800-160 MG PO TABS: 1.0000 | ORAL_TABLET | Freq: Two times a day (BID) | ORAL | 0 refills | Status: AC

## 2021-07-31 ENCOUNTER — Other Ambulatory Visit: Payer: Self-pay | Admitting: Family Medicine

## 2021-07-31 DIAGNOSIS — R928 Other abnormal and inconclusive findings on diagnostic imaging of breast: Secondary | ICD-10-CM

## 2021-08-10 ENCOUNTER — Ambulatory Visit
Admission: RE | Admit: 2021-08-10 | Discharge: 2021-08-10 | Disposition: A | Discharge status: Home / Self Care | Payer: 59 | Source: Ambulatory Visit | Attending: Family Medicine | Admitting: Family Medicine

## 2021-08-10 DIAGNOSIS — R928 Other abnormal and inconclusive findings on diagnostic imaging of breast: Secondary | ICD-10-CM | POA: Diagnosis present

## 2021-08-24 ENCOUNTER — Encounter: Payer: 59 | Admitting: Obstetrics and Gynecology

## 2021-08-30 ENCOUNTER — Other Ambulatory Visit: Payer: Self-pay | Admitting: Family Medicine

## 2021-09-01 MED ORDER — AMPHETAMINE-DEXTROAMPHET ER 20 MG PO CP24: 20.0000 mg | ORAL_CAPSULE | Freq: Every day | ORAL | 0 refills | Status: DC

## 2021-10-23 IMAGING — CT CT ABD-PELV W/ CM
2 of 4 series · 16 of 46 positions shown, 18 images · IV contrast (Omni 300)
Comparison: CT abdomen pelvis dated 12/06/2015.

CLINICAL DATA: 30-year-old female with right lower quadrant
abdominal pain.

EXAM:
CT ABDOMEN AND PELVIS WITH CONTRAST
TECHNIQUE: Multidetector CT imaging of the abdomen and pelvis was performed
using the standard protocol following bolus administration of
intravenous contrast.
CONTRAST:  100mL OMNIPAQUE IOHEXOL 300 MG/ML  SOLN

[Series 3: a/p w/ 5mm · axial · 0.74mm/px · z∈[+792,+1182]mm · 13 of 86 slices shown, 15 images]
[im 4/86  soft-tissue]
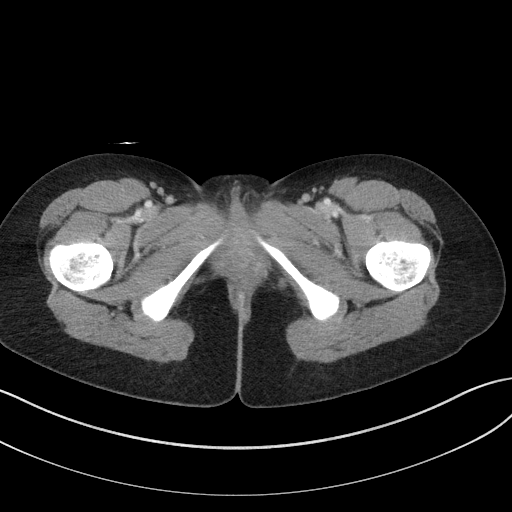
[im 4/86  bone]
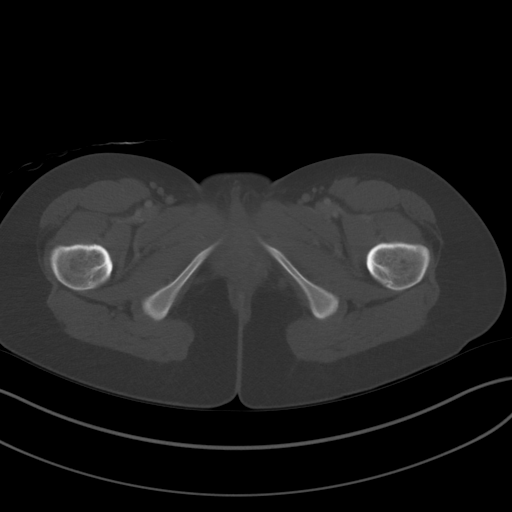
[im 11/86  soft-tissue]
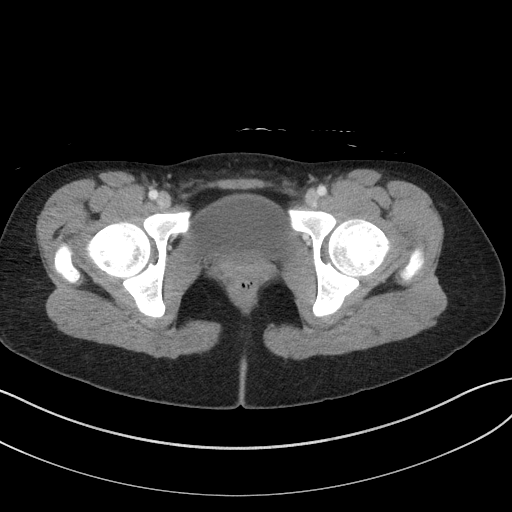
[im 18/86  soft-tissue]
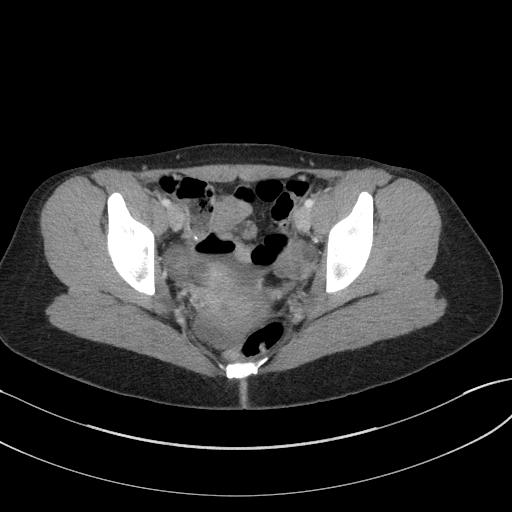
[im 25/86  soft-tissue]
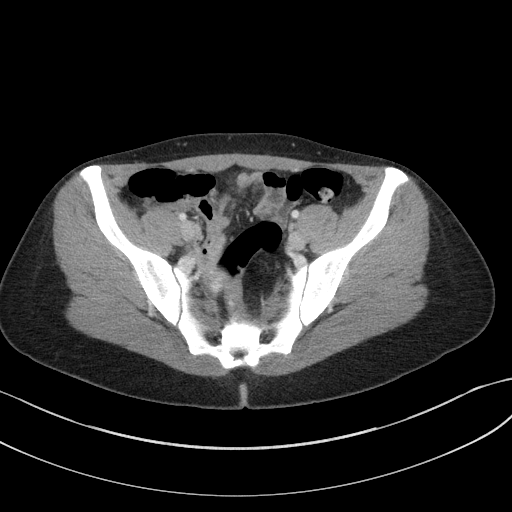
[im 29/86  soft-tissue]
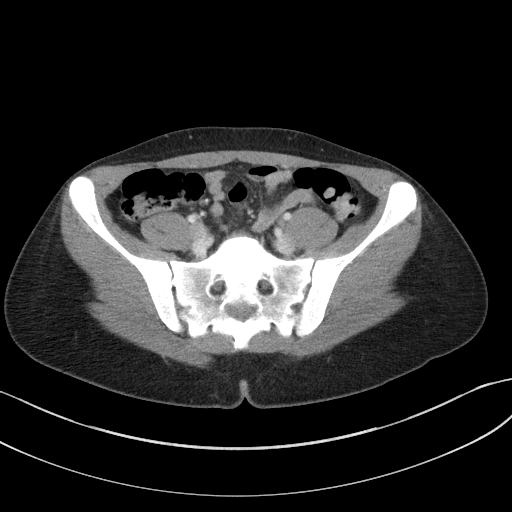
[im 36/86  soft-tissue]
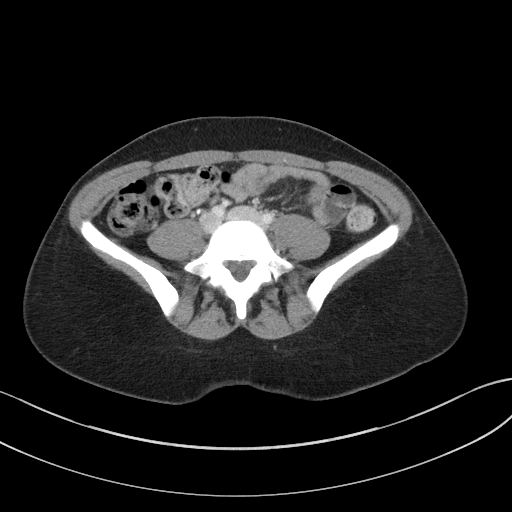
[im 43/86  soft-tissue]
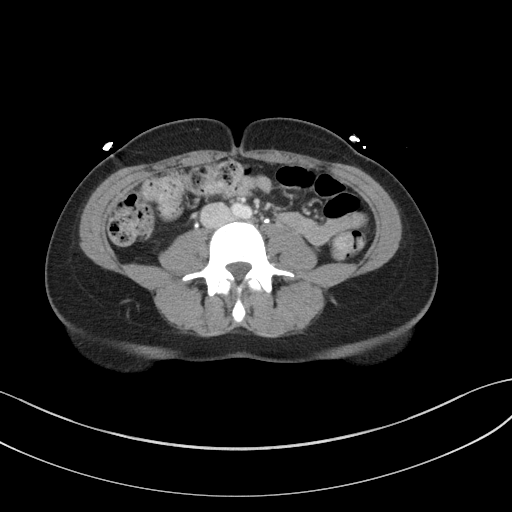
[im 50/86  soft-tissue]
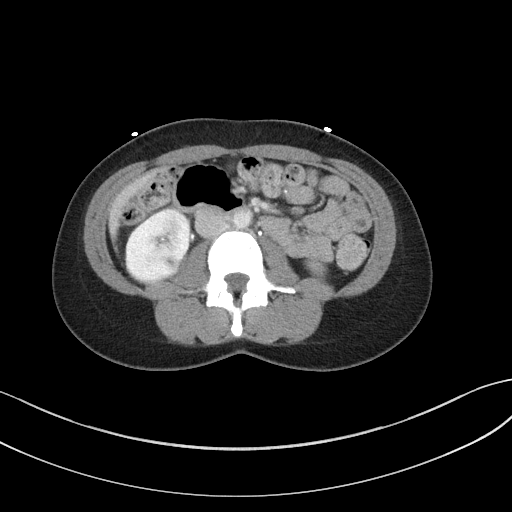
[im 57/86  soft-tissue]
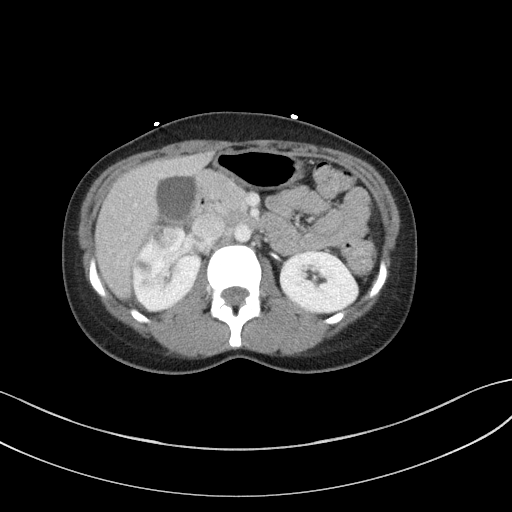
[im 57/86  bone]
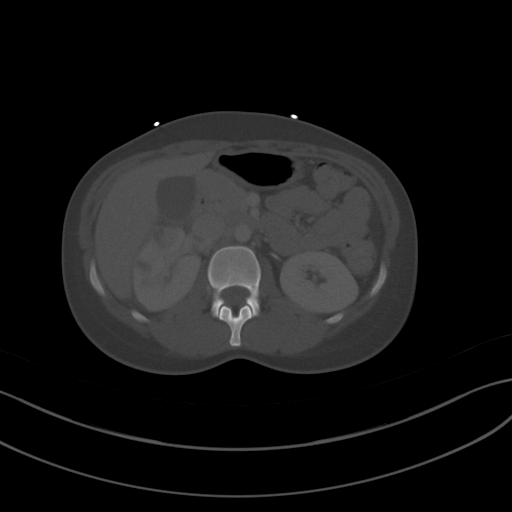
[im 61/86  soft-tissue]
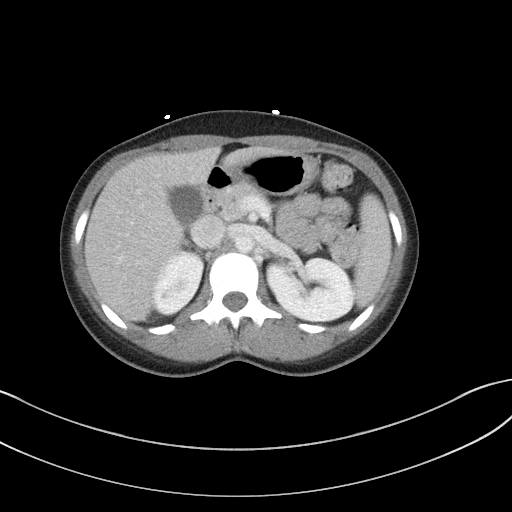
[im 68/86  soft-tissue]
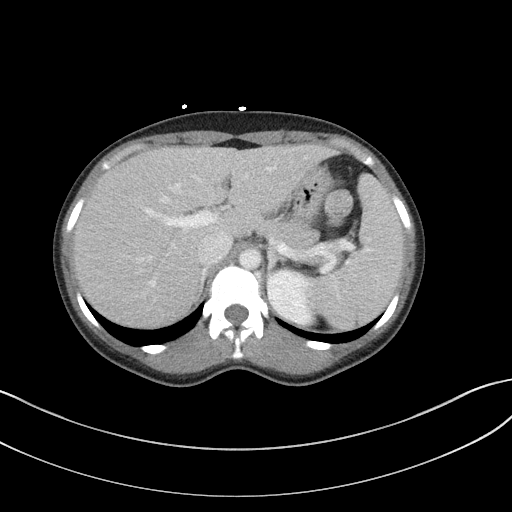
[im 75/86  soft-tissue]
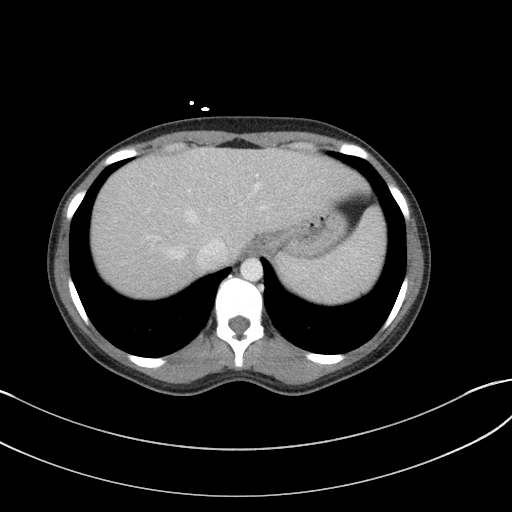
[im 82/86  soft-tissue]
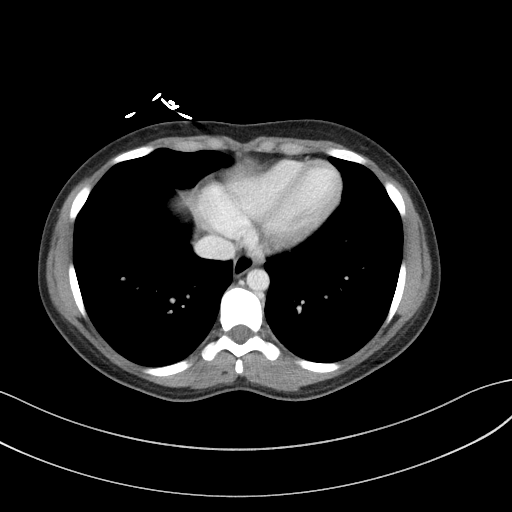

[Series 6: a/p w/ cor · coronal · 0.68mm/px · 3 of 103 slices shown]
[im 35/103  soft-tissue]
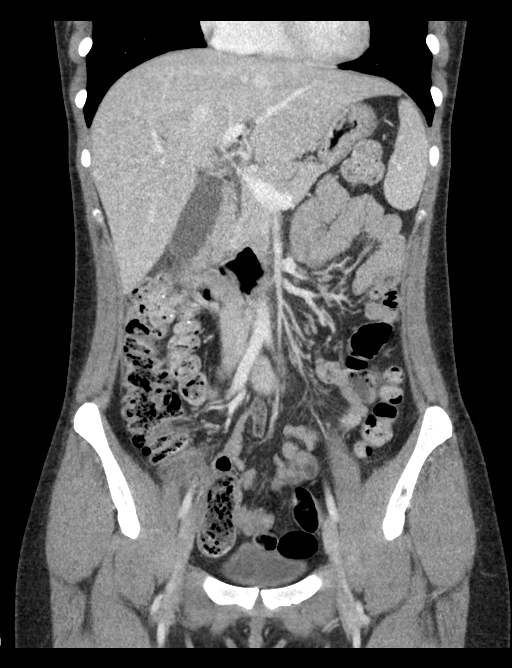
[im 46/103  soft-tissue]
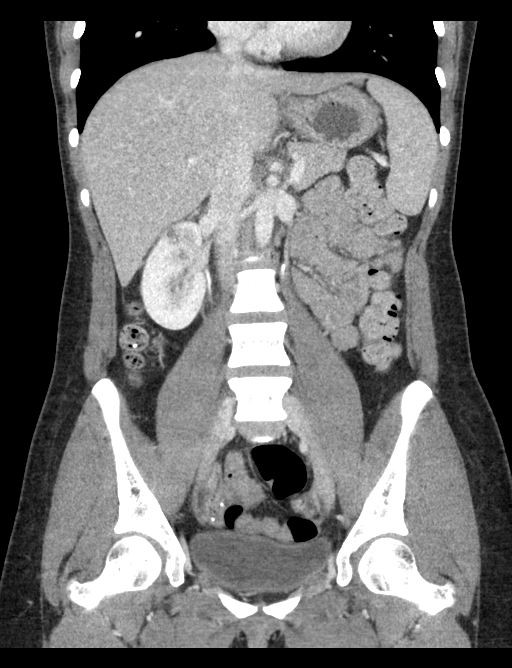
[im 57/103  soft-tissue]
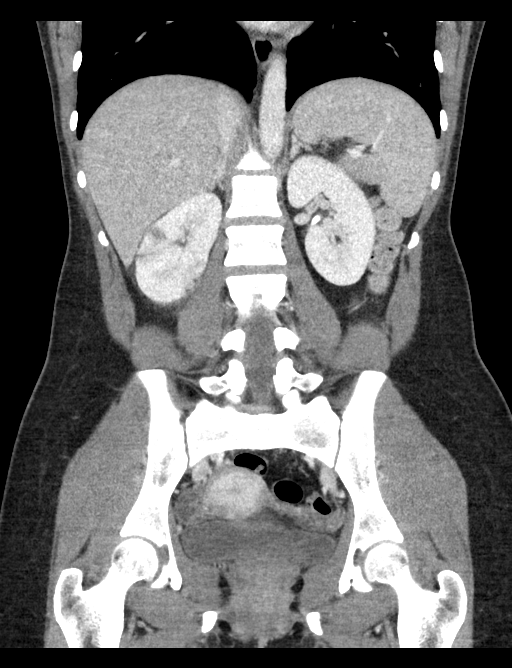

[16 of 46 positions shown; findings below may reference images not displayed]

FINDINGS: Lower chest: The visualized lung bases are clear.

No intra-abdominal free air.  Small free fluid in the pelvis.

Hepatobiliary: No focal liver abnormality is seen. No gallstones,
gallbladder wall thickening, or biliary dilatation.

Pancreas: Unremarkable. No pancreatic ductal dilatation or
surrounding inflammatory changes.

Spleen: Normal in size without focal abnormality.

Adrenals/Urinary Tract: The adrenal glands unremarkable. Multiple
scattered hypoenhancing foci within the right kidney consistent with
pyelonephritis superimposed on prior areas of parenchymal infarct.
There is mild haziness of the right renal sinus fat. No drainable
fluid collection/abscess. No hydronephrosis. The left kidney is
unremarkable. The visualized ureters and urinary bladder appear
unremarkable.

Stomach/Bowel: There is no bowel obstruction or active inflammation.
The appendix is normal.

Vascular/Lymphatic: The abdominal aorta and IVC unremarkable. No
portal venous gas. There is no adenopathy.

Reproductive: The uterus is anteverted and grossly unremarkable. No
adnexal masses.

Other: None

Musculoskeletal: No acute or significant osseous findings.
IMPRESSION: 1. Right-sided pyelonephritis with small areas of parenchymal
infarcts. Correlation with urinalysis recommended. No drainable
fluid collection/abscess.
2. No bowel obstruction. Normal appendix.

## 2021-10-31 ENCOUNTER — Other Ambulatory Visit: Payer: Self-pay | Admitting: Family Medicine

## 2021-11-01 MED ORDER — AMPHETAMINE-DEXTROAMPHET ER 20 MG PO CP24: 20.0000 mg | ORAL_CAPSULE | Freq: Every day | ORAL | 0 refills | Status: DC

## 2021-11-16 ENCOUNTER — Ambulatory Visit (INDEPENDENT_AMBULATORY_CARE_PROVIDER_SITE_OTHER): Payer: 59 | Admitting: Obstetrics and Gynecology

## 2021-11-16 ENCOUNTER — Encounter: Payer: Self-pay | Admitting: Obstetrics and Gynecology

## 2021-11-16 VITALS — BP 116/79 | HR 71 | Resp 16 | Ht 62.0 in | Wt 119.1 lb

## 2021-11-16 DIAGNOSIS — Z131 Encounter for screening for diabetes mellitus: Secondary | ICD-10-CM

## 2021-11-16 DIAGNOSIS — Z01419 Encounter for gynecological examination (general) (routine) without abnormal findings: Secondary | ICD-10-CM

## 2021-11-16 NOTE — Progress Notes (Signed)
GYNECOLOGY ANNUAL PHYSICAL EXAM PROGRESS NOTE  Subjective:    Eileen Davis is a 31 y.o. G0P0000 female who presents for an annual exam. The patient has no complaints today. The patient is sexually active. The patient participates in regular exercise: yes. Has the patient ever been transfused or tattooed?: no. The patient reports that there is not domestic violence in her life.    Menstrual History: Menarche age: 64 No LMP recorded. Patient has had an implant. Period Duration (Days): 4 Period Pattern: (!) Irregular Menstrual Flow: Light Menstrual Control: Other (Comment) Menstrual Control Change Freq (Hours): 12 Dysmenorrhea: None   Gynecologic History:  Contraception:  Nexplanon  inserted 08/2020 History of STI's: Denies Last Pap: 08/24/20. Results were: normal.  Reports h/o abnormal pap smears in the past, h/o LEEP.    Upstream - 11/16/21 1104       Pregnancy Intention Screening   Does the patient want to become pregnant in the next year? No    Does the patient's partner want to become pregnant in the next year? No    Would the patient like to discuss contraceptive options today? No      Contraception Wrap Up   Current Method Hormonal Implant    End Method Hormonal Implant    Contraception Counseling Provided No            The pregnancy intention screening data noted above was reviewed. Potential methods of contraception were discussed. The patient elected to proceed with Hormonal Implant.   OB History  Gravida Para Term Preterm AB Living  0 0 0 0 0 0  SAB IAB Ectopic Multiple Live Births  0 0 0 0 0    Past Medical History:  Diagnosis Date   ADHD    Anxiety and depression    Depression    Migraine    Recurrent UTI    Ureteral reflux 2005   Right side    Past Surgical History:  Procedure Laterality Date   CERVICAL BIOPSY  W/ LOOP ELECTRODE EXCISION     WISDOM TOOTH EXTRACTION  06/2016    Family History  Problem Relation Age of Onset    Osteoporosis Mother    Diabetes Father    Hypertension Father    Healthy Brother    ADD / ADHD Brother    Ovarian cancer Maternal Grandmother    Prostate cancer Maternal Grandfather    Congestive Heart Failure Paternal Grandmother    CVA Paternal Grandmother    Lung cancer Paternal Grandfather     Social History   Socioeconomic History   Marital status: Significant Other    Spouse name: Not on file   Number of children: Not on file   Years of education: Not on file   Highest education level: Not on file  Occupational History   Not on file  Tobacco Use   Smoking status: Former    Packs/day: 0.00    Types: Cigarettes   Smokeless tobacco: Never  Vaping Use   Vaping Use: Former  Substance and Sexual Activity   Alcohol use: Yes    Comment: Rarely   Drug use: No   Sexual activity: Yes    Birth control/protection: Condom, Pill  Other Topics Concern   Not on file  Social History Narrative   Not on file   Social Determinants of Health   Financial Resource Strain: Not on file  Food Insecurity: Not on file  Transportation Needs: Not on file  Physical Activity: Not on  file  Stress: Not on file  Social Connections: Not on file  Intimate Partner Violence: Not on file    Current Outpatient Medications on File Prior to Visit  Medication Sig Dispense Refill   amphetamine-dextroamphetamine (ADDERALL XR) 20 MG 24 hr capsule Take 1 capsule (20 mg total) by mouth daily. 30 capsule 0   etonogestrel (NEXPLANON) 68 MG IMPL implant 1 each by Subdermal route once. Inserted 08/24/2020 by Rubie Maid, MD [facility] [place]     ondansetron (ZOFRAN) 4 MG tablet Take 1 tablet (4 mg total) by mouth every 8 (eight) hours as needed for nausea or vomiting. 20 tablet 0   propranolol (INDERAL) 40 MG tablet Take one pill twice daily. 180 tablet 1   sertraline (ZOLOFT) 100 MG tablet TAKE 1 TABLET(100 MG) BY MOUTH DAILY 90 tablet 0   traZODone (DESYREL) 50 MG tablet Take 0.5-1 tablets (25-50 mg  total) by mouth at bedtime as needed for sleep. 30 tablet 3   No current facility-administered medications on file prior to visit.    Allergies  Allergen Reactions   Bee Venom    Latex      Review of Systems Constitutional: negative for chills, fatigue, fevers and sweats Eyes: negative for irritation, redness and visual disturbance Ears, nose, mouth, throat, and face: negative for hearing loss, nasal congestion, snoring and tinnitus Respiratory: negative for asthma, cough, sputum Cardiovascular: negative for chest pain, dyspnea, exertional chest pressure/discomfort, irregular heart beat, palpitations and syncope Gastrointestinal: negative for abdominal pain, change in bowel habits, nausea and vomiting Genitourinary: negative for abnormal menstrual periods, genital lesions, sexual problems and vaginal discharge, dysuria and urinary incontinence Integument/breast: negative for breast lump, breast tenderness and nipple discharge Hematologic/lymphatic: negative for bleeding and easy bruising Musculoskeletal:negative for back pain and muscle weakness Neurological: negative for dizziness, headaches, vertigo and weakness Endocrine: negative for diabetic symptoms including polydipsia, polyuria and skin dryness Allergic/Immunologic: negative for hay fever and urticaria      Objective:  Blood pressure 116/79, pulse 71, resp. rate 16, height '5\' 2"'$  (1.575 m), weight 119 lb 1.6 oz (54 kg), last menstrual period 10/07/2021.  Body mass index is 21.78 kg/m.    General Appearance:    Alert, cooperative, no distress, appears stated age  Head:    Normocephalic, without obvious abnormality, atraumatic  Eyes:    PERRL, conjunctiva/corneas clear, EOM's intact, both eyes  Ears:    Normal external ear canals, both ears  Nose:   Nares normal, septum midline, mucosa normal, no drainage or sinus tenderness  Throat:   Lips, mucosa, and tongue normal; teeth and gums normal  Neck:   Supple, symmetrical,  trachea midline, no adenopathy; thyroid: no enlargement/tenderness/nodules; no carotid bruit or JVD  Back:     Symmetric, no curvature, ROM normal, no CVA tenderness  Lungs:     Clear to auscultation bilaterally, respirations unlabored  Chest Wall:    No tenderness or deformity   Heart:    Regular rate and rhythm, S1 and S2 normal, no murmur, rub or gallop  Breast Exam:    No tenderness, masses, or nipple abnormality  Abdomen:     Soft, non-tender, bowel sounds active all four quadrants, no masses, no organomegaly.    Genitalia:    Pelvic:external genitalia normal, vagina without lesions, discharge, or tenderness, rectovaginal septum  normal. Cervix normal in appearance, no cervical motion tenderness, no adnexal masses or tenderness.  Uterus normal size, shape, mobile, regular contours, nontender.  Rectal:    Normal external sphincter.  No hemorrhoids appreciated. Internal exam not done.   Extremities:   Extremities normal, atraumatic, no cyanosis or edema  Pulses:   2+ and symmetric all extremities  Skin:   Skin color, texture, turgor normal, no rashes or lesions  Lymph nodes:   Cervical, supraclavicular, and axillary nodes normal  Neurologic:   CNII-XII intact, normal strength, sensation and reflexes throughout   .  Labs:  Lab Results  Component Value Date   WBC 7.1 09/06/2020   HGB 14.1 09/06/2020   HCT 42.0 09/06/2020   MCV 91 09/06/2020   PLT 207 09/06/2020    Lab Results  Component Value Date   CREATININE 1.10 (H) 09/06/2020   BUN 10 09/06/2020   NA 139 09/06/2020   K 4.5 09/06/2020   CL 104 09/06/2020   CO2 21 09/06/2020    Lab Results  Component Value Date   ALT 11 09/06/2020   AST 15 09/06/2020   ALKPHOS 117 09/06/2020   BILITOT 0.4 09/06/2020    Lab Results  Component Value Date   TSH 2.760 09/06/2020     Assessment:   1. Well woman exam with routine gynecological exam   2. Screening for diabetes mellitus      Plan:  Blood tests: CBC with diff,  HgbA1c, and Comprehensive metabolic panel. Patient not fasting.  Breast self exam technique reviewed and patient encouraged to perform self-exam monthly. Contraception: Nexplanon. Discussed healthy lifestyle modifications. Mammogram  : not age appropriate Pap smear  UTD . Follow up in 1 year for annual exam   Rubie Maid, MD [facility]

## 2021-11-17 LAB — COMPREHENSIVE METABOLIC PANEL
ALT: 12 IU/L (ref 0–32)
AST: 19 IU/L (ref 0–40)
Albumin/Globulin Ratio: 2 (ref 1.2–2.2)
Albumin: 4.5 g/dL (ref 4.0–5.0)
Alkaline Phosphatase: 122 IU/L — ABNORMAL HIGH (ref 44–121)
BUN/Creatinine Ratio: 9 (ref 9–23)
BUN: 8 mg/dL (ref 6–20)
Bilirubin Total: 0.6 mg/dL (ref 0.0–1.2)
CO2: 24 mmol/L (ref 20–29)
Calcium: 9.2 mg/dL (ref 8.7–10.2)
Chloride: 104 mmol/L (ref 96–106)
Creatinine, Ser: 0.92 mg/dL (ref 0.57–1.00)
Globulin, Total: 2.3 g/dL (ref 1.5–4.5)
Glucose: 74 mg/dL (ref 70–99)
Potassium: 3.9 mmol/L (ref 3.5–5.2)
Sodium: 141 mmol/L (ref 134–144)
Total Protein: 6.8 g/dL (ref 6.0–8.5)
eGFR: 86 mL/min/{1.73_m2} (ref >59)

## 2021-11-17 LAB — CBC
Hematocrit: 42.4 % (ref 34.0–46.6)
Hemoglobin: 14.1 g/dL (ref 11.1–15.9)
MCH: 29.9 pg (ref 26.6–33.0)
MCHC: 33.3 g/dL (ref 31.5–35.7)
MCV: 90 fL (ref 79–97)
Platelets: 203 10*3/uL (ref 150–450)
RBC: 4.72 x10E6/uL (ref 3.77–5.28)
RDW: 12.1 % (ref 11.7–15.4)
WBC: 8.4 10*3/uL (ref 3.4–10.8)

## 2021-11-17 LAB — HEMOGLOBIN A1C
Est. average glucose Bld gHb Est-mCnc: 97 mg/dL
Hgb A1c MFr Bld: 5 % (ref 4.8–5.6)

## 2021-11-30 ENCOUNTER — Telehealth: Payer: Self-pay | Admitting: Family Medicine

## 2021-11-30 ENCOUNTER — Other Ambulatory Visit: Payer: Self-pay | Admitting: Family Medicine

## 2021-11-30 DIAGNOSIS — F32A Depression, unspecified: Secondary | ICD-10-CM

## 2021-11-30 DIAGNOSIS — G43109 Migraine with aura, not intractable, without status migrainosus: Secondary | ICD-10-CM

## 2021-11-30 MED ORDER — PROPRANOLOL HCL 40 MG PO TABS: 40.0000 mg | ORAL_TABLET | Freq: Two times a day (BID) | ORAL | 3 refills | Status: DC

## 2021-11-30 NOTE — Telephone Encounter (Signed)
[facility] faxed refill request for the following medications:  propranolol (INDERAL) 40 MG tablet   Please advise.

## 2022-01-12 ENCOUNTER — Other Ambulatory Visit: Payer: Self-pay | Admitting: Family Medicine

## 2022-01-12 MED ORDER — AMPHETAMINE-DEXTROAMPHET ER 20 MG PO CP24: 20.0000 mg | ORAL_CAPSULE | Freq: Every day | ORAL | 0 refills | Status: DC

## 2022-03-25 ENCOUNTER — Other Ambulatory Visit: Payer: Self-pay | Admitting: Family Medicine

## 2022-03-25 DIAGNOSIS — F419 Anxiety disorder, unspecified: Secondary | ICD-10-CM

## 2022-03-26 NOTE — Telephone Encounter (Signed)
Courtesy refill. Patient will need an office visit for further refills. Requested Prescriptions  Pending Prescriptions Disp Refills   sertraline (ZOLOFT) 100 MG tablet [Pharmacy Med Name: SERTRALINE '100MG'$  TABLETS] 30 tablet 0    Sig: TAKE 1 TABLET(100 MG) BY MOUTH DAILY     Psychiatry:  Antidepressants - SSRI - sertraline Failed - 03/25/2022  3:33 AM      Failed - Completed PHQ-2 or PHQ-9 in the last 360 days      Failed - Valid encounter within last 6 months    Recent Outpatient Visits           8 months ago Breast pain, left   [facility] Tally Joe T, FNP   1 year ago SVT (supraventricular tachycardia) [facility])   [facility] [name], FNP   1 year ago Attention deficit hyperactivity disorder (ADHD), unspecified ADHD type   [facility], Dionne Bucy, MD   1 year ago Attention deficit hyperactivity disorder (ADHD), unspecified ADHD type   [place], Vickki Muff, PA-C   1 year ago Pyelonephritis   [place], Kelby Aline, FNP              Passed - AST in normal range and within 360 days    AST  Date Value Ref Range Status  11/16/2021 19 0 - 40 IU/L Final         Passed - ALT in normal range and within 360 days    ALT  Date Value Ref Range Status  11/16/2021 12 0 - 32 IU/L Final

## 2022-03-26 NOTE — Telephone Encounter (Signed)
Called pt - [facility] for appt.  

## 2022-03-29 ENCOUNTER — Other Ambulatory Visit: Payer: Self-pay | Admitting: Family Medicine

## 2022-03-29 MED ORDER — AMPHETAMINE-DEXTROAMPHET ER 20 MG PO CP24: 20.0000 mg | ORAL_CAPSULE | Freq: Every day | ORAL | 0 refills | Status: DC

## 2022-04-26 ENCOUNTER — Other Ambulatory Visit: Payer: Self-pay | Admitting: Family Medicine

## 2022-04-26 DIAGNOSIS — F419 Anxiety disorder, unspecified: Secondary | ICD-10-CM

## 2022-04-26 NOTE — Telephone Encounter (Signed)
Requested medication (s) are due for refill today: routing for review  Requested medication (s) are on the active medication list: yes  Last refill:  03/26/22  Future visit scheduled: no  Notes to clinic:  Unable to refill per protocol, courtesy refill already given, routing for provider approval.      Requested Prescriptions  Pending Prescriptions Disp Refills   sertraline (ZOLOFT) 100 MG tablet [Pharmacy Med Name: SERTRALINE '100MG'$  TABLETS] 30 tablet 0    Sig: TAKE 1 TABLET(100 MG) BY MOUTH DAILY     Psychiatry:  Antidepressants - SSRI - sertraline Failed - 04/26/2022  3:33 AM      Failed - Completed PHQ-2 or PHQ-9 in the last 360 days      Failed - Valid encounter within last 6 months    Recent Outpatient Visits           9 months ago Breast pain, left   [facility] Tally Joe T, FNP   1 year ago SVT (supraventricular tachycardia) [facility])   [facility] [name], FNP   1 year ago Attention deficit hyperactivity disorder (ADHD), unspecified ADHD type   [facility], Dionne Bucy, MD   1 year ago Attention deficit hyperactivity disorder (ADHD), unspecified ADHD type   [place], Vickki Muff, PA-C   1 year ago Pyelonephritis   [facility] [place], Kelby Aline, FNP              Passed - AST in normal range and within 360 days    AST  Date Value Ref Range Status  11/16/2021 19 0 - 40 IU/L Final         Passed - ALT in normal range and within 360 days    ALT  Date Value Ref Range Status  11/16/2021 12 0 - 32 IU/L Final

## 2022-05-21 ENCOUNTER — Other Ambulatory Visit: Payer: Self-pay | Admitting: Family Medicine

## 2022-05-21 DIAGNOSIS — F419 Anxiety disorder, unspecified: Secondary | ICD-10-CM

## 2022-05-22 ENCOUNTER — Other Ambulatory Visit: Payer: Self-pay | Admitting: Family Medicine

## 2022-05-23 ENCOUNTER — Other Ambulatory Visit: Payer: Self-pay | Admitting: Family Medicine

## 2022-05-24 ENCOUNTER — Encounter: Payer: Self-pay | Admitting: Family Medicine

## 2022-05-24 ENCOUNTER — Ambulatory Visit: Payer: 59 | Admitting: Family Medicine

## 2022-05-24 VITALS — BP 113/67 | HR 97 | Temp 99.3°F | Wt 130.7 lb

## 2022-05-24 DIAGNOSIS — Z136 Encounter for screening for cardiovascular disorders: Secondary | ICD-10-CM

## 2022-05-24 DIAGNOSIS — F419 Anxiety disorder, unspecified: Secondary | ICD-10-CM

## 2022-05-24 DIAGNOSIS — Z13220 Encounter for screening for lipoid disorders: Secondary | ICD-10-CM | POA: Diagnosis not present

## 2022-05-24 DIAGNOSIS — F909 Attention-deficit hyperactivity disorder, unspecified type: Secondary | ICD-10-CM | POA: Diagnosis not present

## 2022-05-24 DIAGNOSIS — G43109 Migraine with aura, not intractable, without status migrainosus: Secondary | ICD-10-CM

## 2022-05-24 DIAGNOSIS — Z1329 Encounter for screening for other suspected endocrine disorder: Secondary | ICD-10-CM

## 2022-05-24 DIAGNOSIS — F32A Depression, unspecified: Secondary | ICD-10-CM

## 2022-05-24 DIAGNOSIS — I471 Supraventricular tachycardia, unspecified: Secondary | ICD-10-CM

## 2022-05-24 MED ORDER — SERTRALINE HCL 100 MG PO TABS: 100.0000 mg | ORAL_TABLET | Freq: Every day | ORAL | 3 refills | Status: DC

## 2022-05-24 MED ORDER — PROPRANOLOL HCL 40 MG PO TABS: 40.0000 mg | ORAL_TABLET | Freq: Two times a day (BID) | ORAL | 3 refills | Status: DC

## 2022-05-24 MED ORDER — AMPHETAMINE-DEXTROAMPHET ER 20 MG PO CP24: 20.0000 mg | ORAL_CAPSULE | Freq: Every day | ORAL | 0 refills | Status: DC

## 2022-05-24 NOTE — Progress Notes (Signed)
I,Eileen Davis,acting as a Education administrator for Eileen Sprout, FNP.,have documented all relevant documentation on the behalf of Eileen Sprout, FNP,as directed by  Eileen Sprout, FNP while in the presence of Eileen Sprout, FNP.   Established patient visit   Patient: Eileen Davis   DOB: [birth year]   32 y.o. Female  MRN: [id number] Visit Date: 05/24/2022  Today's healthcare provider: [name], FNP  Introduced to nurse practitioner role and practice setting.  All questions answered.  Discussed provider/patient relationship and expectations.  Chief Complaint  Patient presents with   Follow-up   Subjective    HPI  Anxiety, Follow-up  She was last seen for anxiety 1 years ago. Changes made at last visit include: continue Zoloft daily at current dose .   She reports excellent compliance with treatment. She reports excellent tolerance of treatment. She is not having side effects.   She feels her anxiety is moderate and Unchanged since last visit.  Symptoms: No chest pain No difficulty concentrating  No dizziness Yes fatigue  No feelings of losing control Yes insomnia  No irritable Yes palpitations  No panic attacks Yes racing thoughts  Yes shortness of breath Yes sweating  No tremors/shakes    GAD-7 Results    05/24/2022    4:05 PM 05/13/2019    1:40 PM  GAD-7 Generalized Anxiety Disorder Screening Tool  1. Feeling Nervous, Anxious, or on Edge 1 0  2. Not Being Able to Stop or Control Worrying 1 0  3. Worrying Too Much About Different Things 1 0  4. Trouble Relaxing 1 1  5. Being So Restless it's Hard To Sit Still 0 0  6. Becoming Easily Annoyed or Irritable 0 0  7. Feeling Afraid As If Something Awful Might Happen 1 0  Total GAD-7 Score 5 1  Difficulty At Work, Home, or Getting  Along With Others? Somewhat difficult Somewhat difficult    PHQ-9 Scores    05/24/2022    3:11 PM 09/06/2020   10:26 AM 01/03/2017   10:40 AM  PHQ9 SCORE ONLY  PHQ-9 Total Score 3 0 2     --------------------------------------------------------------------------------------------------- Follow up for ADHD  The patient was last seen for this 1 years ago. Changes made at last visit include Continue Adderall XR at current dose for shifts at work (does not take when not working) and non-extended release for shorter shifts .  She reports excellent compliance with treatment. She feels that condition is Improved. She is not having side effects.   -----------------------------------------------------------------------------------------   Medications: Outpatient Medications Prior to Visit  Medication Sig   etonogestrel (NEXPLANON) 68 MG IMPL implant 1 each by Subdermal route once. Inserted 08/24/2020 by Rubie Maid, MD [facility] [place]   traZODone (DESYREL) 50 MG tablet Take 0.5-1 tablets (25-50 mg total) by mouth at bedtime as needed for sleep.   [DISCONTINUED] amphetamine-dextroamphetamine (ADDERALL XR) 20 MG 24 hr capsule Take 1 capsule (20 mg total) by mouth daily. Due for in office/online visit for additional refills to ensure 6 month compliance.   [DISCONTINUED] ondansetron (ZOFRAN) 4 MG tablet Take 1 tablet (4 mg total) by mouth every 8 (eight) hours as needed for nausea or vomiting.   [DISCONTINUED] propranolol (INDERAL) 40 MG tablet Take 1 tablet (40 mg total) by mouth 2 (two) times daily. Take one pill twice daily.   [DISCONTINUED] sertraline (ZOLOFT) 100 MG tablet TAKE 1 TABLET(100 MG) BY MOUTH DAILY   No facility-administered medications prior to visit.  Review of Systems     Objective    BP 113/67 (BP Location: Right Arm, Patient Position: Sitting, Cuff Size: Normal)   Pulse 97   Temp 99.3 F (37.4 C) (Oral)   Wt 130 lb 11.2 oz (59.3 kg)   SpO2 (!) 77%   BMI 23.91 kg/m    Physical Exam Vitals and nursing note reviewed.  Constitutional:      General: She is not in acute distress.    Appearance: Normal appearance. She is normal weight. She is  not ill-appearing, toxic-appearing or diaphoretic.  HENT:     Head: Normocephalic and atraumatic.  Cardiovascular:     Rate and Rhythm: Normal rate and regular rhythm.     Pulses: Normal pulses.     Heart sounds: Normal heart sounds. No murmur heard.    No friction rub. No gallop.  Pulmonary:     Effort: Pulmonary effort is normal. No respiratory distress.     Breath sounds: Normal breath sounds. No stridor. No wheezing, rhonchi or rales.  Chest:     Chest wall: No tenderness.  Musculoskeletal:        General: No swelling, tenderness, deformity or signs of injury. Normal range of motion.     Right lower leg: No edema.     Left lower leg: No edema.  Skin:    General: Skin is warm and dry.     Capillary Refill: Capillary refill takes less than 2 seconds.     Coloration: Skin is not jaundiced or pale.     Findings: No bruising, erythema, lesion or rash.  Neurological:     General: No focal deficit present.     Mental Status: She is alert and oriented to person, place, and time. Mental status is at baseline.     Cranial Nerves: No cranial nerve deficit.     Sensory: No sensory deficit.     Motor: No weakness.     Coordination: Coordination normal.  Psychiatric:        Mood and Affect: Mood normal.        Behavior: Behavior normal.        Thought Content: Thought content normal.        Judgment: Judgment normal.     Results for orders placed or performed in visit on 05/24/22  CBC with Differential/Platelet  Result Value Ref Range   WBC 7.5 3.4 - 10.8 x10E3/uL   RBC 4.70 3.77 - 5.28 x10E6/uL   Hemoglobin 14.0 11.1 - 15.9 g/dL   Hematocrit 42.3 34.0 - 46.6 %   MCV 90 79 - 97 fL   MCH 29.8 26.6 - 33.0 pg   MCHC 33.1 31.5 - 35.7 g/dL   RDW 11.9 11.7 - 15.4 %   Platelets 208 150 - 450 x10E3/uL   Neutrophils 54 Not Estab. %   Lymphs 31 Not Estab. %   Monocytes 12 Not Estab. %   Eos 2 Not Estab. %   Basos 1 Not Estab. %   Neutrophils Absolute 4.1 1.4 - 7.0 x10E3/uL    Lymphocytes Absolute 2.3 0.7 - 3.1 x10E3/uL   Monocytes Absolute 0.9 0.1 - 0.9 x10E3/uL   EOS (ABSOLUTE) 0.2 0.0 - 0.4 x10E3/uL   Basophils Absolute 0.1 0.0 - 0.2 x10E3/uL   Immature Granulocytes 0 Not Estab. %   Immature Grans (Abs) 0.0 0.0 - 0.1 x10E3/uL  Comprehensive Metabolic Panel (CMET)  Result Value Ref Range   Glucose 87 70 - 99 mg/dL   BUN 18 6 -  20 mg/dL   Creatinine, Ser 0.81 0.57 - 1.00 mg/dL   eGFR 100 >59 mL/min/1.73   BUN/Creatinine Ratio 22 9 - 23   Sodium 140 134 - 144 mmol/L   Potassium 4.2 3.5 - 5.2 mmol/L   Chloride 102 96 - 106 mmol/L   CO2 22 20 - 29 mmol/L   Calcium 9.2 8.7 - 10.2 mg/dL   Total Protein 6.7 6.0 - 8.5 g/dL   Albumin 4.5 4.0 - 5.0 g/dL   Globulin, Total 2.2 1.5 - 4.5 g/dL   Albumin/Globulin Ratio 2.0 1.2 - 2.2   Bilirubin Total 0.3 0.0 - 1.2 mg/dL   Alkaline Phosphatase 120 44 - 121 IU/L   AST 19 0 - 40 IU/L   ALT 12 0 - 32 IU/L  Lipid panel  Result Value Ref Range   Cholesterol, Total 187 100 - 199 mg/dL   Triglycerides 68 0 - 149 mg/dL   HDL 81 >39 mg/dL   VLDL Cholesterol Cal 13 5 - 40 mg/dL   LDL Chol Calc (NIH) 93 0 - 99 mg/dL   Chol/HDL Ratio 2.3 0.0 - 4.4 ratio  TSH  Result Value Ref Range   TSH 2.810 0.450 - 4.500 uIU/mL    Assessment & Plan     Problem List Items Addressed This Visit       Cardiovascular and Mediastinum   Migraine - Primary    Chronic, stable Wishes to continue BB to assist Plan to refer to neurology as needed Previously associated with sleep disturbances       Relevant Medications   propranolol (INDERAL) 40 MG tablet   sertraline (ZOLOFT) 100 MG tablet   Other Relevant Orders   CBC with Differential/Platelet (Completed)   Comprehensive Metabolic Panel (CMET) (Completed)   SVT (supraventricular tachycardia)    Chronic, stable Denies issues with rebound tachycardia or feeling overheated Has reduced caffeine intake to assist       Relevant Medications   propranolol (INDERAL) 40 MG tablet    Other Relevant Orders   CBC with Differential/Platelet (Completed)   TSH (Completed)     Other   Anxiety and depression    Chronic, stable Endorses stressors with passing of mom Denies change in medication at this time Work and new husband has been very supportive Continue to monitor Denies SI or HI      Relevant Medications   sertraline (ZOLOFT) 100 MG tablet   Attention deficit hyperactivity disorder (ADHD)    Chronic, stable Continue adderall to assist       Encounter for lipid screening for cardiovascular disease    Recommend baseline screening at 32 years old recommend diet low in saturated fat and regular exercise - 30 min at least 5 times per week       Relevant Orders   Lipid panel (Completed)   Screening for thyroid disorder    Recommend screening given hx of palpitations/SVT      Relevant Orders   TSH (Completed)   Return in about 6 months (around 11/22/2022) for chonic disease management.     Vonna Kotyk, FNP, have reviewed all documentation for this visit. The documentation on 05/28/22 for the exam, diagnosis, procedures, and orders are all accurate and complete.  Eileen Davis, [place] [phone] (phone) [phone] (fax)  [place]

## 2022-05-25 LAB — LIPID PANEL
Chol/HDL Ratio: 2.3 ratio (ref 0.0–4.4)
Cholesterol, Total: 187 mg/dL (ref 100–199)
HDL: 81 mg/dL (ref >39)
LDL Chol Calc (NIH): 93 mg/dL (ref 0–99)
Triglycerides: 68 mg/dL (ref 0–149)
VLDL Cholesterol Cal: 13 mg/dL (ref 5–40)

## 2022-05-25 LAB — COMPREHENSIVE METABOLIC PANEL
ALT: 12 IU/L (ref 0–32)
AST: 19 IU/L (ref 0–40)
Albumin/Globulin Ratio: 2 (ref 1.2–2.2)
Albumin: 4.5 g/dL (ref 4.0–5.0)
Alkaline Phosphatase: 120 IU/L (ref 44–121)
BUN/Creatinine Ratio: 22 (ref 9–23)
BUN: 18 mg/dL (ref 6–20)
Bilirubin Total: 0.3 mg/dL (ref 0.0–1.2)
CO2: 22 mmol/L (ref 20–29)
Calcium: 9.2 mg/dL (ref 8.7–10.2)
Chloride: 102 mmol/L (ref 96–106)
Creatinine, Ser: 0.81 mg/dL (ref 0.57–1.00)
Globulin, Total: 2.2 g/dL (ref 1.5–4.5)
Glucose: 87 mg/dL (ref 70–99)
Potassium: 4.2 mmol/L (ref 3.5–5.2)
Sodium: 140 mmol/L (ref 134–144)
Total Protein: 6.7 g/dL (ref 6.0–8.5)
eGFR: 100 mL/min/{1.73_m2} (ref >59)

## 2022-05-25 LAB — CBC WITH DIFFERENTIAL/PLATELET
Basophils Absolute: 0.1 10*3/uL (ref 0.0–0.2)
Basos: 1 %
EOS (ABSOLUTE): 0.2 10*3/uL (ref 0.0–0.4)
Eos: 2 %
Hematocrit: 42.3 % (ref 34.0–46.6)
Hemoglobin: 14 g/dL (ref 11.1–15.9)
Immature Grans (Abs): 0 10*3/uL (ref 0.0–0.1)
Immature Granulocytes: 0 %
Lymphocytes Absolute: 2.3 10*3/uL (ref 0.7–3.1)
Lymphs: 31 %
MCH: 29.8 pg (ref 26.6–33.0)
MCHC: 33.1 g/dL (ref 31.5–35.7)
MCV: 90 fL (ref 79–97)
Monocytes Absolute: 0.9 10*3/uL (ref 0.1–0.9)
Monocytes: 12 %
Neutrophils Absolute: 4.1 10*3/uL (ref 1.4–7.0)
Neutrophils: 54 %
Platelets: 208 10*3/uL (ref 150–450)
RBC: 4.7 x10E6/uL (ref 3.77–5.28)
RDW: 11.9 % (ref 11.7–15.4)
WBC: 7.5 10*3/uL (ref 3.4–10.8)

## 2022-05-25 LAB — TSH: TSH: 2.81 u[IU]/mL (ref 0.450–4.500)

## 2022-05-28 NOTE — Assessment & Plan Note (Addendum)
Chronic, stable Wishes to continue BB to assist Plan to refer to neurology as needed Previously associated with sleep disturbances

## 2022-05-28 NOTE — Assessment & Plan Note (Signed)
Recommend baseline screening at 32 years old recommend diet low in saturated fat and regular exercise - 30 min at least 5 times per week

## 2022-05-28 NOTE — Progress Notes (Signed)
All labs are normal and stable.  Eileen Davis [name], FNP  [facility] [address] [place], [place] [address] [phone] (phone) [phone] (fax) [facility] 

## 2022-05-28 NOTE — Assessment & Plan Note (Signed)
Chronic, stable Continue adderall to assist

## 2022-05-28 NOTE — Assessment & Plan Note (Signed)
Recommend screening given hx of palpitations/SVT

## 2022-05-28 NOTE — Assessment & Plan Note (Signed)
Chronic, stable Denies issues with rebound tachycardia or feeling overheated Has reduced caffeine intake to assist

## 2022-05-28 NOTE — Assessment & Plan Note (Signed)
Chronic, stable Endorses stressors with passing of mom Denies change in medication at this time Work and new husband has been very supportive Continue to monitor Denies SI or HI

## 2022-06-08 ENCOUNTER — Encounter: Payer: Self-pay | Admitting: Family Medicine

## 2022-06-08 ENCOUNTER — Ambulatory Visit: Payer: 59 | Admitting: Family Medicine

## 2022-06-08 VITALS — BP 114/92 | HR 67 | Temp 98.8°F | Wt 134.0 lb

## 2022-06-08 DIAGNOSIS — J069 Acute upper respiratory infection, unspecified: Secondary | ICD-10-CM

## 2022-06-08 DIAGNOSIS — J0140 Acute pansinusitis, unspecified: Secondary | ICD-10-CM | POA: Diagnosis not present

## 2022-06-08 DIAGNOSIS — J4521 Mild intermittent asthma with (acute) exacerbation: Secondary | ICD-10-CM | POA: Diagnosis not present

## 2022-06-08 DIAGNOSIS — I471 Supraventricular tachycardia, unspecified: Secondary | ICD-10-CM | POA: Diagnosis not present

## 2022-06-08 DIAGNOSIS — J45909 Unspecified asthma, uncomplicated: Secondary | ICD-10-CM | POA: Insufficient documentation

## 2022-06-08 HISTORY — DX: Unspecified asthma, uncomplicated: J45.909

## 2022-06-08 LAB — POCT INFLUENZA A/B
Influenza A, POC: NEGATIVE
Influenza B, POC: NEGATIVE

## 2022-06-08 MED ORDER — AMOXICILLIN-POT CLAVULANATE 875-125 MG PO TABS: 1.0000 | ORAL_TABLET | Freq: Two times a day (BID) | ORAL | 0 refills | Status: DC

## 2022-06-08 MED ORDER — METHYLPREDNISOLONE 4 MG PO TBPK: ORAL_TABLET | ORAL | 0 refills | Status: DC

## 2022-06-08 NOTE — Assessment & Plan Note (Signed)
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 Symptoms localized to pt alone; not in spouse. Not relieved with OTC medications POC flu negative; home COVID negative x2. No throat exudate or plaquing

## 2022-06-08 NOTE — Assessment & Plan Note (Signed)
Hx of tobacco use with acute upper airway involvement in setting of sinusitis  Recommend steroid taper to assist with s/s mgmt

## 2022-06-08 NOTE — Assessment & Plan Note (Signed)
Denies no current symptoms despite acute illness Continue to monitor HR and symptoms Ensure adequate hydration and rest

## 2022-06-08 NOTE — Progress Notes (Signed)
I,Connie R Striblin,acting as a Education administrator for [name], FNP.,have documented all relevant documentation on the behalf of [name], FNP,as directed by  [name], FNP while in the presence of [name], FNP.  Established patient visit  Patient: Eileen Davis   DOB: [birth year]   32 y.o. Female  MRN: [id number] Visit Date: 06/08/2022  Today's healthcare provider: [name], FNP  Introduced to nurse practitioner role and practice setting.  All questions answered.  Discussed provider/patient relationship and expectations.  Chief Complaint  Patient presents with   Cough        Subjective    HPI HPI     Cough    Additional comments:        Last edited by Araceli Bouche, CMA on 06/08/2022  9:52 AM.      Upper respiratory symptoms She complains of achiness, congestion, facial pain, nausea without vomiting, non productive cough, sore throat, and wheezing.with no fever, chills, night sweats or weight loss. Onset of symptoms was about a week ago and worsening.She is drinking plenty of fluids.  Past history is significant for no history of pneumonia or bronchitis.   2 negative at home Covid tests over the past week.  Symptoms are bring treated with otc cough medication with no relief.  ---------------------------------------------------------------------------------------------------  Medications: Outpatient Medications Prior to Visit  Medication Sig   amphetamine-dextroamphetamine (ADDERALL XR) 20 MG 24 hr capsule Take 1 capsule (20 mg total) by mouth daily.   etonogestrel (NEXPLANON) 68 MG IMPL implant 1 each by Subdermal route once. Inserted 08/24/2020 by Rubie Maid, MD [facility] [place]   propranolol (INDERAL) 40 MG tablet Take 1 tablet (40 mg total) by mouth 2 (two) times daily. Take one pill twice daily.   sertraline (ZOLOFT) 100 MG tablet Take 1 tablet (100 mg total) by mouth daily.   traZODone (DESYREL) 50 MG tablet Take 0.5-1 tablets (25-50 mg  total) by mouth at bedtime as needed for sleep.   No facility-administered medications prior to visit.    Review of Systems    Objective    BP (!) 114/92 (BP Location: Right Arm, Patient Position: Sitting, Cuff Size: Normal)   Pulse 67   Temp 98.8 F (37.1 C) (Oral)   Wt 134 lb (60.8 kg)   SpO2 98%   BMI 24.51 kg/m   Physical Exam Vitals and nursing note reviewed.  Constitutional:      General: She is not in acute distress.    Appearance: Normal appearance. She is normal weight. She is ill-appearing. She is not toxic-appearing or diaphoretic.  HENT:     Head: Normocephalic and atraumatic.     Right Ear: Tympanic membrane, ear canal and external ear normal.     Left Ear: Tympanic membrane, ear canal and external ear normal. Swelling and tenderness present.     Nose: Congestion and rhinorrhea present.     Right Sinus: Maxillary sinus tenderness and frontal sinus tenderness present.     Left Sinus: Maxillary sinus tenderness and frontal sinus tenderness present.     Mouth/Throat:     Mouth: Mucous membranes are moist.     Pharynx: Oropharynx is clear. Posterior oropharyngeal erythema present.  Eyes:     Extraocular Movements: Extraocular movements intact.     Pupils: Pupils are equal, round, and reactive to light.  Cardiovascular:     Rate and Rhythm: Normal rate and regular rhythm.     Pulses: Normal pulses.  Heart sounds: Normal heart sounds. No murmur heard.    No friction rub. No gallop.  Pulmonary:     Effort: Pulmonary effort is normal. No respiratory distress.     Breath sounds: Normal breath sounds. No stridor. No wheezing, rhonchi or rales.  Chest:     Chest wall: No tenderness.  Musculoskeletal:        General: No swelling, tenderness, deformity or signs of injury. Normal range of motion.     Right lower leg: No edema.     Left lower leg: No edema.  Lymphadenopathy:     Cervical: Cervical adenopathy present.  Skin:    General: Skin is warm and dry.      Capillary Refill: Capillary refill takes less than 2 seconds.     Coloration: Skin is not jaundiced or pale.     Findings: No bruising, erythema, lesion or rash.  Neurological:     General: No focal deficit present.     Mental Status: She is alert and oriented to person, place, and time. Mental status is at baseline.     Cranial Nerves: No cranial nerve deficit.     Sensory: No sensory deficit.     Motor: No weakness.     Coordination: Coordination normal.  Psychiatric:        Mood and Affect: Mood normal.        Behavior: Behavior normal.        Thought Content: Thought content normal.        Judgment: Judgment normal.     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 Symptoms localized to pt alone; not in spouse. Not relieved with OTC medications POC flu negative; home COVID negative x2. No throat exudate or plaquing       Relevant Medications   amoxicillin-clavulanate (AUGMENTIN) 875-125 MG tablet   methylPREDNISolone (MEDROL DOSEPAK) 4 MG TBPK tablet   Reactive airway disease    Hx of tobacco use with acute upper airway involvement in setting of sinusitis  Recommend steroid taper to assist with s/s mgmt       Relevant Medications   amoxicillin-clavulanate (AUGMENTIN) 875-125 MG tablet   methylPREDNISolone (MEDROL DOSEPAK) 4 MG TBPK tablet   Other Visit Diagnoses     Upper  respiratory tract infection, unspecified type       Relevant Orders   POCT Influenza A/B (Completed)      Return if symptoms worsen or fail to improve.     Vonna Kotyk, FNP, have reviewed all documentation for this visit. The documentation on 06/08/22 for the exam, diagnosis, procedures, and orders are all accurate and complete.  [name], [place] [phone] (phone) [phone] (fax)  [place]

## 2022-06-08 NOTE — Patient Instructions (Signed)
Some things that can make you feel better are: - Increased rest - Increasing Fluids - Acetaminophen / ibuprofen as needed for fever/pain.  - Salt water gargling, chloraseptic spray and throat lozenges - OTC pseudoephedrine.  - Mucinex.  - Saline sinus flushes or a neti pot.  - Humidifying the air.

## 2022-07-23 ENCOUNTER — Other Ambulatory Visit: Payer: Self-pay | Admitting: Family Medicine

## 2022-07-24 NOTE — Telephone Encounter (Signed)
Requested medication (s) are due for refill today -no  Requested medication (s) are on the active medication list -yes  Future visit scheduled -no  Last refill: 05/24/22 #90  Notes to clinic: non delegated Rx  Requested Prescriptions  Pending Prescriptions Disp Refills   amphetamine-dextroamphetamine (ADDERALL XR) 20 MG 24 hr capsule 90 capsule 0    Sig: Take 1 capsule (20 mg total) by mouth daily.     Not Delegated - Psychiatry:  Stimulants/ADHD Failed - 07/24/2022  6:38 AM      Failed - This refill cannot be delegated      Failed - Urine Drug Screen completed in last 360 days      Failed - Last BP in normal range    BP Readings from Last 1 Encounters:  06/08/22 (!) 114/92         Passed - Last Heart Rate in normal range    Pulse Readings from Last 1 Encounters:  06/08/22 67         Passed - Valid encounter within last 6 months    Recent Outpatient Visits           1 month ago Acute non-recurrent pansinusitis   [facility] Tally Joe T, FNP   2 months ago Migraine with aura and without status migrainosus, not intractable   [place] Tally Joe T, FNP   1 year ago Breast pain, left   [place] Tally Joe T, [place]   1 year ago SVT (supraventricular tachycardia) [facility])   [place] Tally Joe T, FNP   1 year ago Attention deficit hyperactivity disorder (ADHD), unspecified ADHD type   [facility], Dionne Bucy, MD                 Requested Prescriptions  Pending Prescriptions Disp Refills   amphetamine-dextroamphetamine (ADDERALL XR) 20 MG 24 hr capsule 90 capsule 0    Sig: Take 1 capsule (20 mg total) by mouth daily.     Not Delegated - Psychiatry:  Stimulants/ADHD Failed - 07/24/2022  6:38 AM      Failed - This refill cannot be delegated      Failed - Urine Drug Screen completed in last 360 days      Failed -  Last BP in normal range    BP Readings from Last 1 Encounters:  06/08/22 (!) 114/92         Passed - Last Heart Rate in normal range    Pulse Readings from Last 1 Encounters:  06/08/22 67         Passed - Valid encounter within last 6 months    Recent Outpatient Visits           1 month ago Acute non-recurrent pansinusitis   [facility] Tally Joe T, FNP   2 months ago Migraine with aura and without status migrainosus, not intractable   [place] [name], FNP   1 year ago Breast pain, left   [place] Tally Joe T, FNP   1 year ago SVT (supraventricular tachycardia) [facility])   [place] Tally Joe T, FNP   1 year ago Attention deficit hyperactivity disorder (ADHD), unspecified ADHD type   [facility] [place], Dionne Bucy, MD

## 2022-07-25 MED ORDER — AMPHETAMINE-DEXTROAMPHET ER 20 MG PO CP24: 20.0000 mg | ORAL_CAPSULE | Freq: Every day | ORAL | 0 refills | Status: DC

## 2022-07-26 ENCOUNTER — Encounter: Payer: Self-pay | Admitting: Physician Assistant

## 2022-07-26 ENCOUNTER — Ambulatory Visit: Payer: 59 | Admitting: Physician Assistant

## 2022-07-26 VITALS — BP 101/70 | HR 76 | Temp 97.7°F | Wt 131.0 lb

## 2022-07-26 DIAGNOSIS — N390 Urinary tract infection, site not specified: Secondary | ICD-10-CM | POA: Diagnosis not present

## 2022-07-26 DIAGNOSIS — R319 Hematuria, unspecified: Secondary | ICD-10-CM

## 2022-07-26 LAB — POCT URINE PREGNANCY: Preg Test, Ur: NEGATIVE

## 2022-07-26 LAB — POCT URINALYSIS DIPSTICK
Bilirubin, UA: NEGATIVE
Glucose, UA: NEGATIVE
Ketones, UA: NEGATIVE
Nitrite, UA: NEGATIVE
Protein, UA: NEGATIVE
Spec Grav, UA: 1.02 (ref 1.010–1.025)
Urobilinogen, UA: 0.2 E.U./dL
pH, UA: 6.5 (ref 5.0–8.0)

## 2022-07-26 MED ORDER — FLUCONAZOLE 150 MG PO TABS: 150.0000 mg | ORAL_TABLET | Freq: Once | ORAL | 1 refills | Status: AC

## 2022-07-26 MED ORDER — NITROFURANTOIN MONOHYD MACRO 100 MG PO CAPS: 100.0000 mg | ORAL_CAPSULE | Freq: Two times a day (BID) | ORAL | 0 refills | Status: DC

## 2022-07-26 NOTE — Progress Notes (Signed)
[place] P[place],acting as a Education administrator for [employer], PA-C.,have documented all relevant documentation on the behalf of Mardene Speak, PA-C,as directed by  [employer], PA-C while in the presence of [employer], PA-C.     Established patient visit   Patient: Eileen Davis   DOB: 11-22-1990   32 y.o. Female  MRN: [id number] Visit Date: 07/26/2022  Today's healthcare provider: Mardene Speak, PA-C  CC: UTI  Subjective    HPI  Patient is a 32 year old female who present for evaluation of possible urinary infection.  Patient states she began with pain and frequency on 07/24/22. Since then she has had urgency, decreased urine flow, hematuria, flank pain, nausea and diarrhea. No hx of kidney stone, Has  hx of recurrent UTIs Last UTI in 2021 in January Vapes for 2-3 years, daily. Right ureteral reflux was diagnosed when she was 32 yo, she has been taking antibiotics until she was 33-14 yo for recurrent kidney infections  Medications: Outpatient Medications Prior to Visit  Medication Sig   amphetamine-dextroamphetamine (ADDERALL XR) 20 MG 24 hr capsule Take 1 capsule (20 mg total) by mouth daily.   etonogestrel (NEXPLANON) 68 MG IMPL implant 1 each by Subdermal route once. Inserted 08/24/2020 by Rubie Maid, MD [facility] [place]   propranolol (INDERAL) 40 MG tablet Take 1 tablet (40 mg total) by mouth 2 (two) times daily. Take one pill twice daily.   sertraline (ZOLOFT) 100 MG tablet Take 1 tablet (100 mg total) by mouth daily.   traZODone (DESYREL) 50 MG tablet Take 0.5-1 tablets (25-50 mg total) by mouth at bedtime as needed for sleep.   [DISCONTINUED] amoxicillin-clavulanate (AUGMENTIN) 875-125 MG tablet Take 1 tablet by mouth 2 (two) times daily.   [DISCONTINUED] methylPREDNISolone (MEDROL DOSEPAK) 4 MG TBPK tablet Take as written on package.   No facility-administered medications prior to visit.    Review of Systems  Genitourinary:  Positive for decreased urine volume,  dysuria, flank pain, frequency and hematuria. Negative for difficulty urinating, dyspareunia, enuresis, pelvic pain and urgency.       Objective    BP 101/70 (BP Location: Left Arm, Patient Position: Sitting, Cuff Size: Normal)   Pulse 76   Temp 97.7 F (36.5 C) (Oral)   Wt 131 lb (59.4 kg)   SpO2 98%   BMI 23.96 kg/m    Physical Exam Vitals reviewed.  Constitutional:      General: She is not in acute distress.    Appearance: She is well-developed.  HENT:     Head: Normocephalic and atraumatic.  Eyes:     General: No scleral icterus.    Conjunctiva/sclera: Conjunctivae normal.  Cardiovascular:     Rate and Rhythm: Normal rate and regular rhythm.     Heart sounds: Normal heart sounds. No murmur heard. Pulmonary:     Effort: Pulmonary effort is normal. No respiratory distress.     Breath sounds: Normal breath sounds. No wheezing or rales.  Abdominal:     General: There is no distension.     Palpations: Abdomen is soft.     Tenderness: There is abdominal tenderness in the suprapubic area. There is no right CVA tenderness, left CVA tenderness, guarding or rebound.  Skin:    General: Skin is warm and dry.     Capillary Refill: Capillary refill takes less than 2 seconds.     Findings: No rash.  Neurological:     Mental Status: She is alert and oriented to person, place, and time.  Psychiatric:        Behavior: Behavior normal.        Thought Content: Thought content normal.        Judgment: Judgment normal.      Results for orders placed or performed in visit on 07/26/22  POCT urinalysis dipstick  Result Value Ref Range   Color, UA yellow    Clarity, UA cloudy    Glucose, UA Negative Negative   Bilirubin, UA neg    Ketones, UA neg    Spec Grav, UA 1.020 1.010 - 1.025   Blood, UA 3+    pH, UA 6.5 5.0 - 8.0   Protein, UA Negative Negative   Urobilinogen, UA 0.2 0.2 or 1.0 E.U./dL   Nitrite, UA neg    Leukocytes, UA Large (3+) (A) Negative   Appearance     Odor     POCT urine pregnancy  Result Value Ref Range   Preg Test, Ur Negative Negative    Assessment & Plan     1. Urinary tract infection with hematuria, site unspecified Acute problem Normal vitals, no fever, no flank pain, no CVA tendeness Has a hx of recurrent UTIs Has a hx of recurrent kidney infection / between age [age] yo Per pt, she was diagnosed with right ureteral reflux when she was 32 yo Had a resistance to penicillins, cephalosporins.,aztreonam. in 2016 - POCT urinalysis dipstick positive for blood, leukocytes - Urine Culture - Urinalysis, microscopic only  Pregnancy test was negative Advised Macrobid 500 mg x 7 days, and diflucan 150mg  , 1 tab every 3 days x 2 if needed for yeast infection Will FU  Return in about 2 weeks (around 08/09/2022) for FU for hematuria.     The patient was advised to call back or seek an in-person evaluation if the symptoms worsen or if the condition fails to improve as anticipated.  I discussed the assessment and treatment plan with the patient. The patient was provided an opportunity to ask questions and all were answered. The patient agreed with the plan and demonstrated an understanding of the instructions.  I, Mardene Speak, PA-C have reviewed all documentation for this visit. The documentation on  07/26/22  for the exam, diagnosis, procedures, and orders are all accurate and complete.  Mardene Speak, [facility], [place] [phone] (phone) [phone] (fax)   [place]

## 2022-07-27 LAB — URINALYSIS, MICROSCOPIC ONLY
Casts: NONE SEEN /lpf
WBC, UA: >30 /hpf — AB (ref 0–5)

## 2022-07-31 LAB — URINE CULTURE

## 2022-07-31 NOTE — Progress Notes (Signed)
Please, let pt know that her current abx should cover her UTI. Ur culture showed E. Coli.

## 2022-07-31 NOTE — Progress Notes (Signed)
However, if her symptoms persist she needs to contact us for reassessment

## 2022-08-13 NOTE — Progress Notes (Deleted)
      Established patient visit   Patient: Eileen Davis   DOB: [birth year]   32 y.o. Female  MRN: [id number] Visit Date: 08/14/2022  Today's healthcare provider: Debera Lat, PA-C   No chief complaint on file.  Subjective    HPI  Follow up for hematuria  The patient was last seen for this 2 weeks ago. Changes made at last visit include start Macrobid 50 mg x 7 days and diflucan for UTI.   She reports {excellent/good/fair/poor:19665} compliance with treatment. She feels that condition is {improved/worse/unchanged:3041574}. She {is/is not:21021397} having side effects. ***  -----------------------------------------------------------------------------------------   Medications: Outpatient Medications Prior to Visit  Medication Sig   amphetamine-dextroamphetamine (ADDERALL XR) 20 MG 24 hr capsule Take 1 capsule (20 mg total) by mouth daily.   etonogestrel (NEXPLANON) 68 MG IMPL implant 1 each by Subdermal route once. Inserted 08/24/2020 by Hildred Laser, MD [facility] [place]   nitrofurantoin, macrocrystal-monohydrate, (MACROBID) 100 MG capsule Take 1 capsule (100 mg total) by mouth 2 (two) times daily.   propranolol (INDERAL) 40 MG tablet Take 1 tablet (40 mg total) by mouth 2 (two) times daily. Take one pill twice daily.   sertraline (ZOLOFT) 100 MG tablet Take 1 tablet (100 mg total) by mouth daily.   traZODone (DESYREL) 50 MG tablet Take 0.5-1 tablets (25-50 mg total) by mouth at bedtime as needed for sleep.   No facility-administered medications prior to visit.    Review of Systems  {Labs  Heme  Chem  Endocrine  Serology  Results Review (optional):23779}   Objective    There were no vitals taken for this visit. {Show previous vital signs (optional):23777}  Physical Exam  ***  No results found for any visits on 08/14/22.  Assessment & Plan     ***  No follow-ups on file.      {provider attestation***:1}   Debera Lat, PA-C  [facility] [phone] (phone) [phone] (fax)  [facility]

## 2022-08-14 ENCOUNTER — Ambulatory Visit: Payer: 59 | Admitting: Physician Assistant

## 2022-08-16 ENCOUNTER — Ambulatory Visit: Payer: 59 | Admitting: Physician Assistant

## 2022-08-16 ENCOUNTER — Encounter: Payer: Self-pay | Admitting: Physician Assistant

## 2022-08-16 VITALS — BP 117/76 | HR 76 | Ht 62.0 in | Wt 133.0 lb

## 2022-08-16 DIAGNOSIS — N3001 Acute cystitis with hematuria: Secondary | ICD-10-CM | POA: Diagnosis not present

## 2022-08-16 DIAGNOSIS — R319 Hematuria, unspecified: Secondary | ICD-10-CM

## 2022-08-16 LAB — POCT URINALYSIS DIPSTICK
Bilirubin, UA: NEGATIVE
Glucose, UA: NEGATIVE
Ketones, UA: NEGATIVE
Leukocytes, UA: NEGATIVE
Nitrite, UA: NEGATIVE
Protein, UA: NEGATIVE
Spec Grav, UA: >=1.03 — AB (ref 1.010–1.025)
Urobilinogen, UA: 0.2 E.U./dL
pH, UA: 7 (ref 5.0–8.0)

## 2022-08-16 NOTE — Progress Notes (Signed)
Established patient visit   Patient: Eileen Davis   DOB: [birth year]   32 y.o. Female  MRN: [id number] Visit Date: 08/16/2022  Today's healthcare provider: Debera Lat, PA-C   Chief Complaint  Patient presents with   Follow-up    Pt stated--doing much better and no pain   Subjective    HPI  Pt has been having blood in urine but attributed it to her breakthrough period. She is currently on nexplanon. Pt states she completed her UTI treatment and her UTI symptoms including dysuria improved/resolved.  Medications: Outpatient Medications Prior to Visit  Medication Sig   amphetamine-dextroamphetamine (ADDERALL XR) 20 MG 24 hr capsule Take 1 capsule (20 mg total) by mouth daily.   etonogestrel (NEXPLANON) 68 MG IMPL implant 1 each by Subdermal route once. Inserted 08/24/2020 by Hildred Laser, MD [facility] [place],   propranolol (INDERAL) 40 MG tablet Take 1 tablet (40 mg total) by mouth 2 (two) times daily. Take one pill twice daily.   sertraline (ZOLOFT) 100 MG tablet Take 1 tablet (100 mg total) by mouth daily.   traZODone (DESYREL) 50 MG tablet Take 0.5-1 tablets (25-50 mg total) by mouth at bedtime as needed for sleep.   [DISCONTINUED] nitrofurantoin, macrocrystal-monohydrate, (MACROBID) 100 MG capsule Take 1 capsule (100 mg total) by mouth 2 (two) times daily.   No facility-administered medications prior to visit.    Review of Systems  All other systems reviewed and are negative. Except see HPI      Objective    BP 117/76   Pulse 76   Ht 5\' 2"  (1.575 m)   Wt 133 lb (60.3 kg)   SpO2 99%   BMI 24.33 kg/m    Physical Exam Constitutional:      General: She is not in acute distress.    Appearance: Normal appearance.  HENT:     Head: Normocephalic.  Pulmonary:     Effort: Pulmonary effort is normal. No respiratory distress.  Abdominal:     General: Abdomen is flat. Bowel sounds are normal.     Palpations: Abdomen is soft.     Tenderness: There is no  abdominal tenderness.  Neurological:     Mental Status: She is alert and oriented to person, place, and time. Mental status is at baseline.      Results for orders placed or performed in visit on 08/16/22  POCT urinalysis dipstick  Result Value Ref Range   Color, UA yellow    Clarity, UA clear    Glucose, UA Negative Negative   Bilirubin, UA neg    Ketones, UA neg    Spec Grav, UA >=1.030 (A) 1.010 - 1.025   Blood, UA trace    pH, UA 7.0 5.0 - 8.0   Protein, UA Negative Negative   Urobilinogen, UA 0.2 0.2 or 1.0 E.U./dL   Nitrite, UA neg    Leukocytes, UA Negative Negative   Appearance     Odor      Assessment & Plan     1. Hematuria, unspecified type 2. Acute cystitis with hematuria - POCT urinalysis dipstick Negative for bacteria but positive for trace of  blood  Hx of smoking Most likely trace of blood due to breakthrough menses Precautions were given and pt will return if symptoms persist Pt prefers not to proceed with culture at this point  The patient was advised to call back or seek an in-person evaluation if the symptoms worsen or if the condition fails to improve  as anticipated.  No follow-ups on file.     I discussed the assessment and treatment plan with the patient. The patient was provided an opportunity to ask questions and all were answered. The patient agreed with the plan and demonstrated an understanding of the instructions.  I, Debera Lat, PA-C have reviewed all documentation for this visit. The documentation on 08/16/22 for the exam, diagnosis, procedures, and orders are all accurate and complete.  Debera Lat, [facility], MMS [facility] [phone] (phone) [phone] (fax)   [facility]

## 2022-08-18 DIAGNOSIS — R319 Hematuria, unspecified: Secondary | ICD-10-CM | POA: Insufficient documentation

## 2022-10-08 ENCOUNTER — Other Ambulatory Visit: Payer: Self-pay | Admitting: Family Medicine

## 2022-10-10 MED ORDER — AMPHETAMINE-DEXTROAMPHET ER 20 MG PO CP24: 20.0000 mg | ORAL_CAPSULE | Freq: Every day | ORAL | 0 refills | Status: DC

## 2022-10-13 ENCOUNTER — Ambulatory Visit (HOSPITAL_COMMUNITY)
Admission: EM | Admit: 2022-10-13 | Discharge: 2022-10-13 | Disposition: A | Discharge status: Home / Self Care | Payer: 59 | Attending: Internal Medicine | Admitting: Internal Medicine

## 2022-10-13 ENCOUNTER — Other Ambulatory Visit: Payer: Self-pay

## 2022-10-13 ENCOUNTER — Encounter (HOSPITAL_COMMUNITY): Payer: Self-pay | Admitting: *Deleted

## 2022-10-13 DIAGNOSIS — J020 Streptococcal pharyngitis: Secondary | ICD-10-CM | POA: Diagnosis not present

## 2022-10-13 LAB — POCT RAPID STREP A (OFFICE): Rapid Strep A Screen: POSITIVE — AB

## 2022-10-13 MED ORDER — AMOXICILLIN 500 MG PO CAPS: 500.0000 mg | ORAL_CAPSULE | Freq: Two times a day (BID) | ORAL | 0 refills | Status: AC

## 2022-10-13 NOTE — ED Provider Notes (Signed)
[facility]    CSN: [id number] Arrival date & time: 10/13/22  1157      History   Chief Complaint Chief Complaint  Patient presents with   Sore Throat    HPI Eileen Davis is a 32 y.o. female.   Patient presents to urgent care for evaluation of sore throat that started 3 days ago. Sore throat is worsened by swallowing. Her nephew was recently sick with URI symptoms but tested negative for strep, covid, and flu. No fever, chills, rash, or dizziness. No nausea, vomiting, or abdominal pain. Reports dry cough, bilateral ear pain, and neck pain. She does report body aches intermittently. No recent antibiotic/steroid use. Taking OTC nyquil and dayquil without much relief.    Sore Throat    Past Medical History:  Diagnosis Date   ADHD    Anxiety and depression    Depression    Migraine    Reactive airway disease 06/08/2022   Recurrent UTI    Ureteral reflux 2005   Right side    Patient Active Problem List   Diagnosis Date Noted   Hematuria 08/18/2022   Acute non-recurrent pansinusitis 06/08/2022   Reactive airway disease 06/08/2022   Encounter for lipid screening for cardiovascular disease 05/24/2022   Screening for thyroid disorder 05/24/2022   Breast pain, left 07/20/2021   Attention deficit hyperactivity disorder (ADHD) 07/20/2021   Stress and adjustment reaction 03/14/2021   Insomnia 11/10/2020   Paroxysmal SVT (supraventricular tachycardia) 06/01/2020   Anxiety and depression 09/18/2018   ADHD 05/02/2018   Migraine 09/26/2017   History of ovarian cyst 04/25/2016    Past Surgical History:  Procedure Laterality Date   CERVICAL BIOPSY  W/ LOOP ELECTRODE EXCISION     WISDOM TOOTH EXTRACTION  06/2016    OB History     Gravida  0   Para  0   Term  0   Preterm  0   AB  0   Living  0      SAB  0   IAB  0   Ectopic  0   Multiple  0   Live Births               Home Medications    Prior to Admission medications    Medication Sig Start Date End Date Taking? Authorizing Provider  amoxicillin (AMOXIL) 500 MG capsule Take 1 capsule (500 mg total) by mouth 2 (two) times daily for 10 days. 10/13/22 10/23/22 Yes Carlisle Beers, FNP  amphetamine-dextroamphetamine (ADDERALL XR) 20 MG 24 hr capsule Take 1 capsule (20 mg total) by mouth daily. 10/10/22  Yes Merita Norton T, FNP  etonogestrel (NEXPLANON) 68 MG IMPL implant 1 each by Subdermal route once. Inserted 08/24/2020 by Hildred Laser, MD [facility] [place]   Yes [provider]  propranolol (INDERAL) 40 MG tablet Take 1 tablet (40 mg total) by mouth 2 (two) times daily. Take one pill twice daily. 05/24/22  Yes Jacky Kindle, FNP  sertraline (ZOLOFT) 100 MG tablet Take 1 tablet (100 mg total) by mouth daily. 05/24/22  Yes Jacky Kindle, FNP  traZODone (DESYREL) 50 MG tablet Take 0.5-1 tablets (25-50 mg total) by mouth at bedtime as needed for sleep. 11/10/20  Yes Bacigalupo, Marzella Schlein, MD    Family History Family History  Problem Relation Age of Onset   Osteoporosis Mother    Diabetes Father    Hypertension Father    Healthy Brother    ADD / ADHD Brother  Ovarian cancer Maternal Grandmother    Prostate cancer Maternal Grandfather    Congestive Heart Failure Paternal Grandmother    CVA Paternal Grandmother    Lung cancer Paternal Grandfather     Social History Social History   Tobacco Use   Smoking status: Former    Packs/day: 0    Types: Cigarettes   Smokeless tobacco: Never  Vaping Use   Vaping Use: Former  Substance Use Topics   Alcohol use: Yes    Comment: Rarely   Drug use: No     Allergies   Bee venom and Latex   Review of Systems Review of Systems Per HPI  Physical Exam Triage Vital Signs ED Triage Vitals  Enc Vitals Group     BP 10/13/22 1251 115/80     Pulse Rate 10/13/22 1251 (!) 110     Resp 10/13/22 1251 18     Temp 10/13/22 1251 98.8 F (37.1 C)     Temp src --      SpO2 10/13/22 1251 93 %     Weight --       Height --      Head Circumference --      Peak Flow --      Pain Score 10/13/22 1249 5     Pain Loc --      Pain Edu? --      Excl. in GC? --    No data found.  Updated Vital Signs BP 115/80   Pulse (!) 110   Temp 98.8 F (37.1 C)   Resp 18   SpO2 93%   Visual Acuity Right Eye Distance:   Left Eye Distance:   Bilateral Distance:    Right Eye Near:   Left Eye Near:    Bilateral Near:     Physical Exam Vitals and nursing note reviewed.  Constitutional:      Appearance: She is not ill-appearing or toxic-appearing.  HENT:     Head: Normocephalic and atraumatic.     Right Ear: Hearing, tympanic membrane, ear canal and external ear normal.     Left Ear: Hearing, tympanic membrane, ear canal and external ear normal.     Nose: Nose normal.     Mouth/Throat:     Lips: Pink.     Mouth: Mucous membranes are moist. No injury.     Tongue: No lesions. Tongue does not deviate from midline.     Palate: No mass and lesions.     Pharynx: Uvula midline. Pharyngeal swelling and posterior oropharyngeal erythema present. No oropharyngeal exudate or uvula swelling.     Tonsils: No tonsillar exudate or tonsillar abscesses. 2+ on the right. 2+ on the left.  Eyes:     General: Lids are normal. Vision grossly intact. Gaze aligned appropriately.     Extraocular Movements: Extraocular movements intact.     Conjunctiva/sclera: Conjunctivae normal.  Cardiovascular:     Rate and Rhythm: Normal rate and regular rhythm.     Heart sounds: Normal heart sounds, S1 normal and S2 normal.  Pulmonary:     Effort: Pulmonary effort is normal. No respiratory distress.     Breath sounds: Normal breath sounds and air entry.  Musculoskeletal:     Cervical back: Neck supple.  Lymphadenopathy:     Cervical: Cervical adenopathy present.  Skin:    General: Skin is warm and dry.     Capillary Refill: Capillary refill takes less than 2 seconds.     Findings: No rash.  Neurological:     General: No  focal deficit present.     Mental Status: She is alert and oriented to person, place, and time. Mental status is at baseline.     Cranial Nerves: No dysarthria or facial asymmetry.  Psychiatric:        Mood and Affect: Mood normal.        Speech: Speech normal.        Behavior: Behavior normal.        Thought Content: Thought content normal.        Judgment: Judgment normal.      UC Treatments / Results  Labs (all labs ordered are listed, but only abnormal results are displayed) Labs Reviewed  POCT RAPID STREP A (OFFICE) - Abnormal; Notable for the following components:      Result Value   Rapid Strep A Screen Positive (*)    All other components within normal limits    EKG   Radiology No results found.  Procedures Procedures (including critical care time)  Medications Ordered in UC Medications - No data to display  Initial Impression / Assessment and Plan / UC Course  I have reviewed the triage vital signs and the nursing notes.  Pertinent labs & imaging results that were available during my care of the patient were reviewed by me and considered in my medical decision making (see chart for details).   1. Strep pharyngitis POC strep testing is positive.  Amoxicillin sent to pharmacy to be taken twice daily for the next 10 days with food.  No allergies to antibiotics.  Ibuprofen and Tylenol may be used every 6 hours as needed for throat pain and fever/chills.  Advised to change toothbrush after 2 to 3 days of antibiotic use to prevent reinfection.  Salt water gargles and warm tea with honey may be used as needed to soothe sore throat.   Discussed red flag signs and symptoms of worsening condition,when to call the PCP office, return to urgent care, and when to seek higher level of care in the emergency department. Counseled patient regarding appropriate use of medications and potential side effects for all medications recommended or prescribed today. Patient verbalizes  understanding and agreement with plan. Discharged in stable condition.     Final Clinical Impressions(s) / UC Diagnoses   Final diagnoses:  Strep pharyngitis     Discharge Instructions      You have strep throat.  - Take the prescribed antibiotic as directed for the next 10 days. - Take ibuprofen 600 mg every 6 hours as needed for throat pain and fever.  - Change your toothbrush after 2 to 3 days of antibiotic use to prevent reinfection. - You may also gargle with salt water and drink warm tea with honey to soothe your throat.  If you develop any new or worsening symptoms or do not improve in the next 2 to 3 days, please return.  If your symptoms are severe, please go to the emergency room.  Follow-up with your primary care provider for further evaluation and management of your symptoms as well as ongoing wellness visits.  I hope you feel better!     ED Prescriptions     Medication Sig Dispense Auth. Provider   amoxicillin (AMOXIL) 500 MG capsule Take 1 capsule (500 mg total) by mouth 2 (two) times daily for 10 days. 20 capsule Carlisle Beers, FNP      PDMP not reviewed this encounter.   Carlisle Beers, [place] 10/13/22  1322  

## 2022-10-13 NOTE — ED Triage Notes (Signed)
Pt reports sore throat since Thursday . Pt reports sore throat is worse today. Pt reported her nephew was sick recently.

## 2022-10-13 NOTE — Discharge Instructions (Signed)
You have strep throat.  - Take the prescribed antibiotic as directed for the next 10 days. - Take ibuprofen 600 mg every 6 hours as needed for throat pain and fever.  - Change your toothbrush after 2 to 3 days of antibiotic use to prevent reinfection. - You may also gargle with salt water and drink warm tea with honey to soothe your throat.  If you develop any new or worsening symptoms or do not improve in the next 2 to 3 days, please return.  If your symptoms are severe, please go to the emergency room.  Follow-up with your primary care provider for further evaluation and management of your symptoms as well as ongoing wellness visits.  I hope you feel better! 

## 2022-12-16 IMAGING — US US BREAST*L* LIMITED INC AXILLA
1 series · 9 of 9 positions shown · non-contrast
Comparison: None.

CLINICAL DATA: Palpable area in the LEFT outer breast with
overlying erythema and heat. This has improved with decreased
residual overlying erythema. Patient has a LEFT-sided nipple
piercing and is meticulous about cleaning the piercing. Patient also
reports increased physical exertion with a history of a possible
trauma.

EXAM:
ULTRASOUND OF THE LEFT BREAST

[Series 1: us breast*left* limited inc axilla · 0.06mm/px · 9 of 9 slices shown]
[im 1/9]
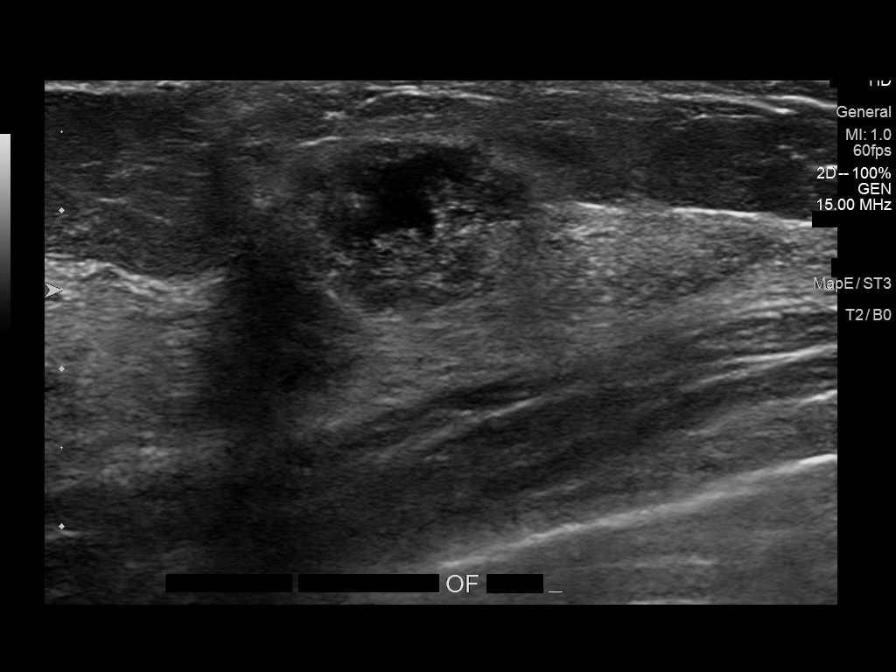
[im 2/9]
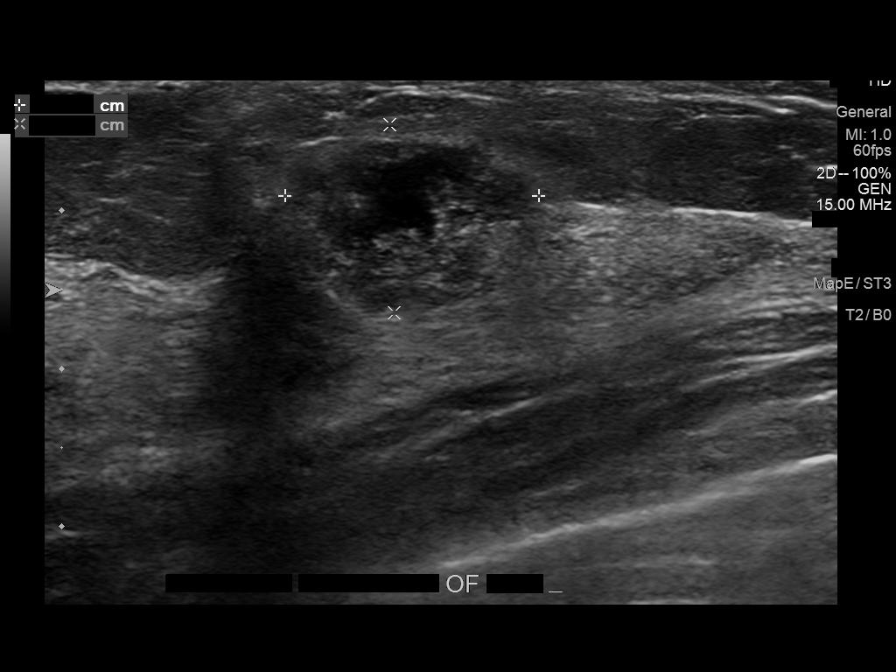
[im 3/9]
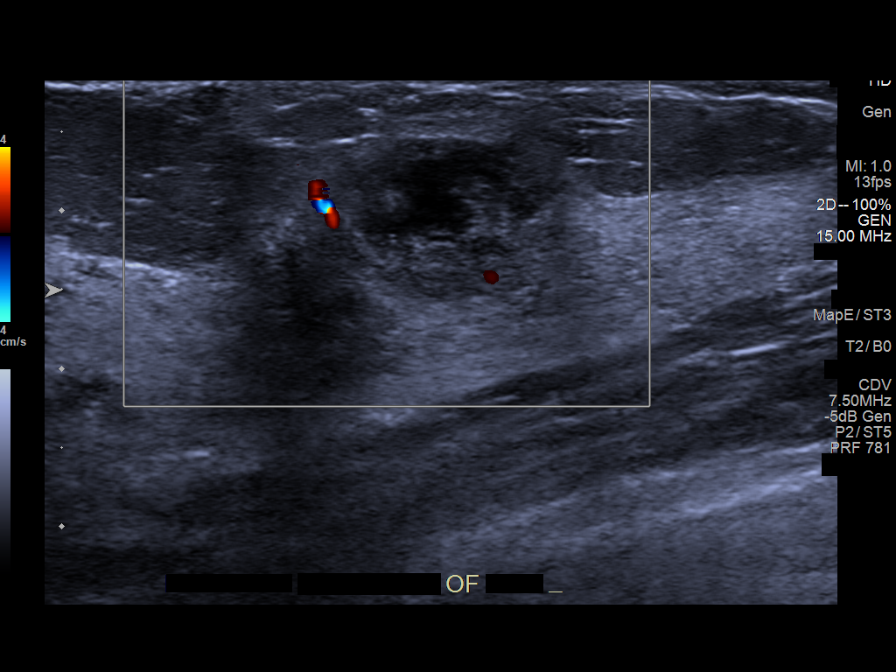
[im 4/9]
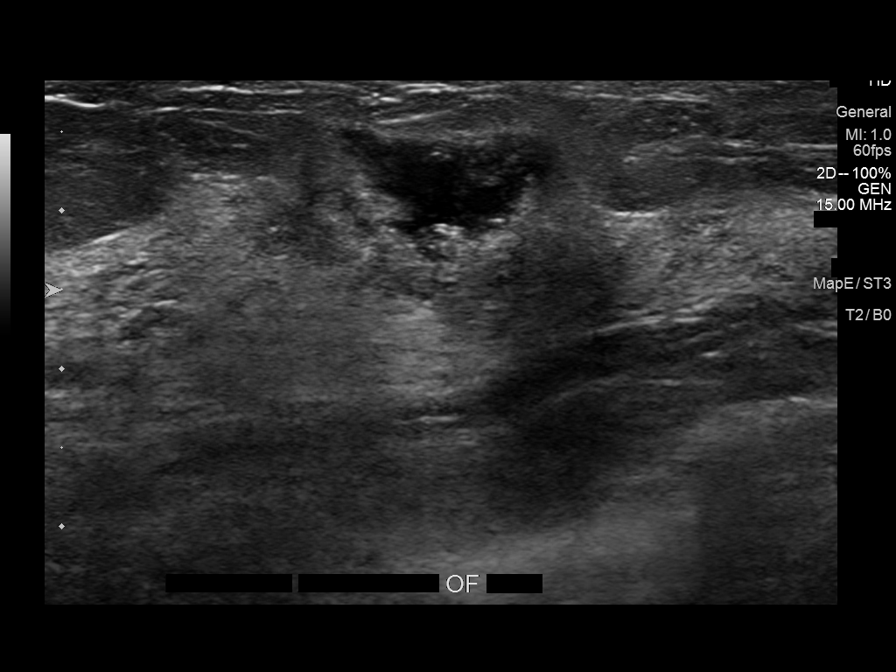
[im 5/9]
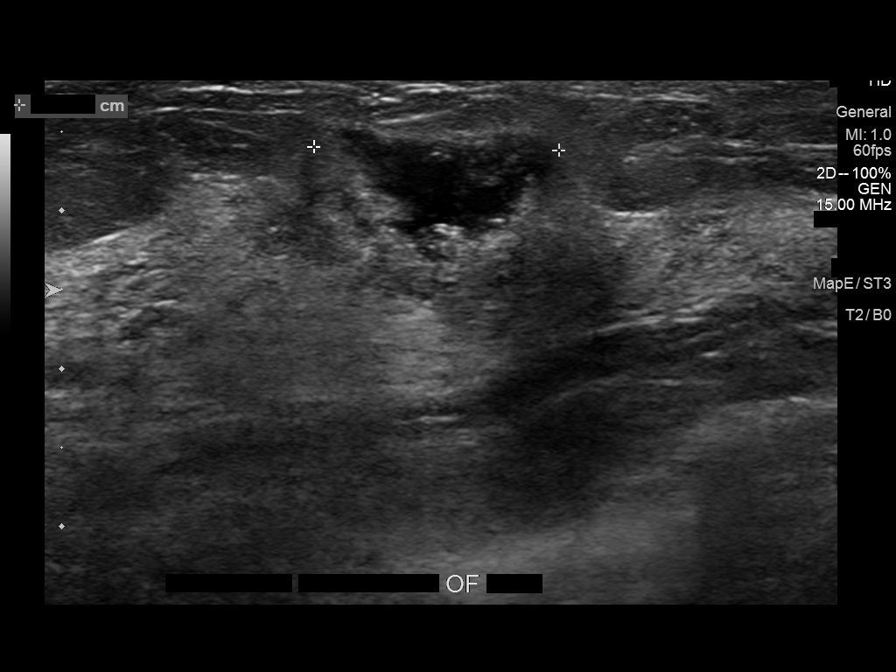
[im 6/9]
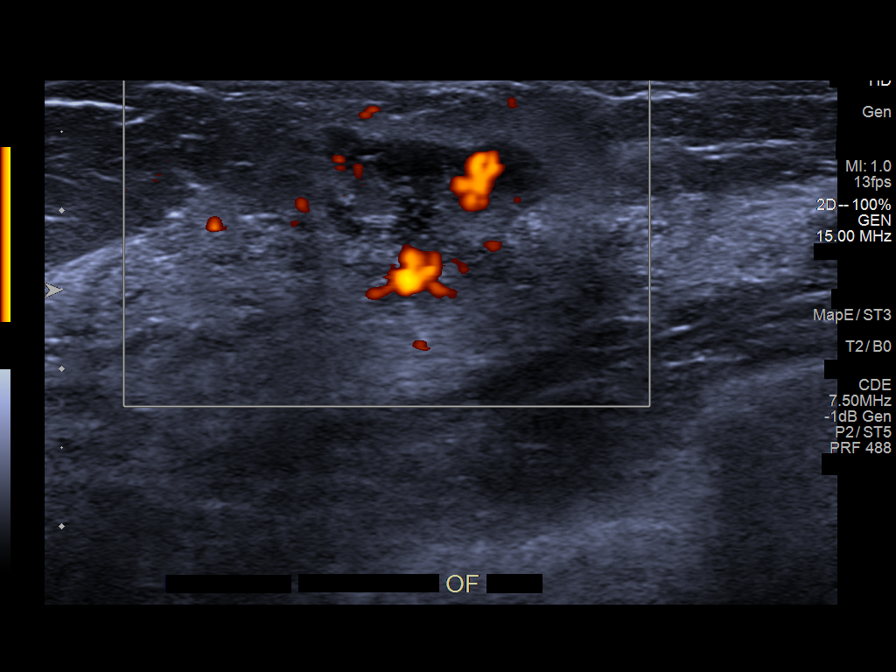
[im 7/9]
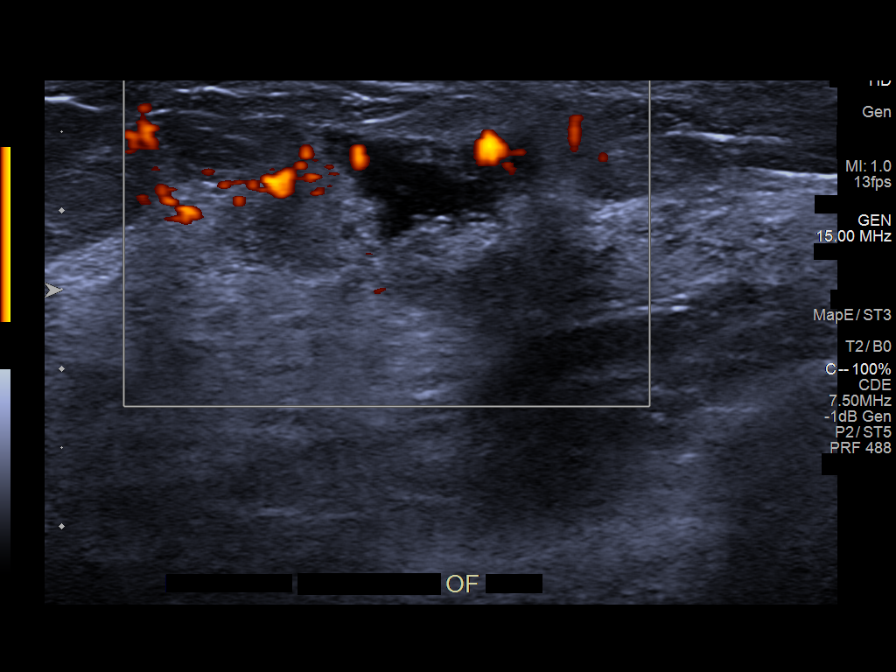
[im 8/9]
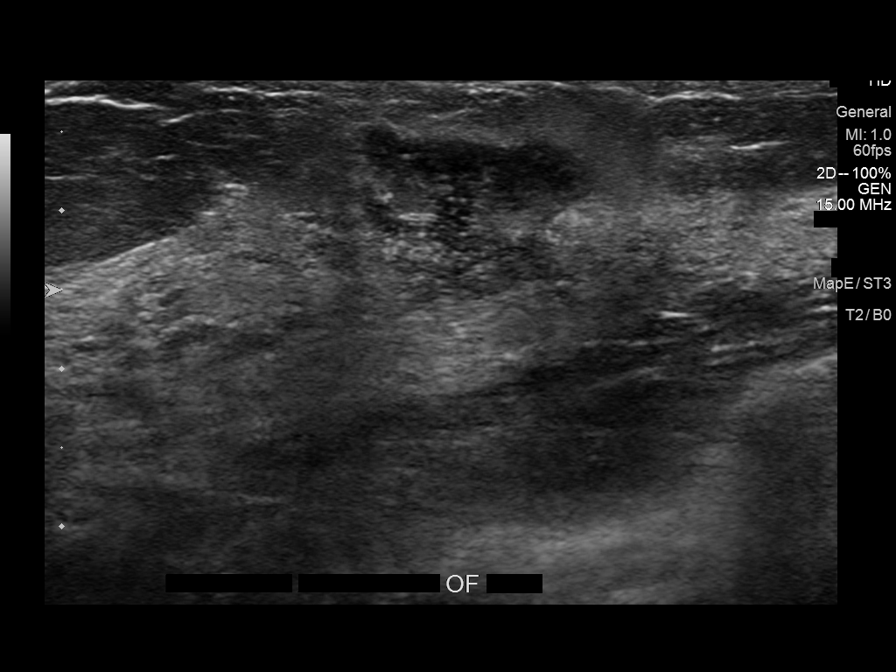
[im 9/9]
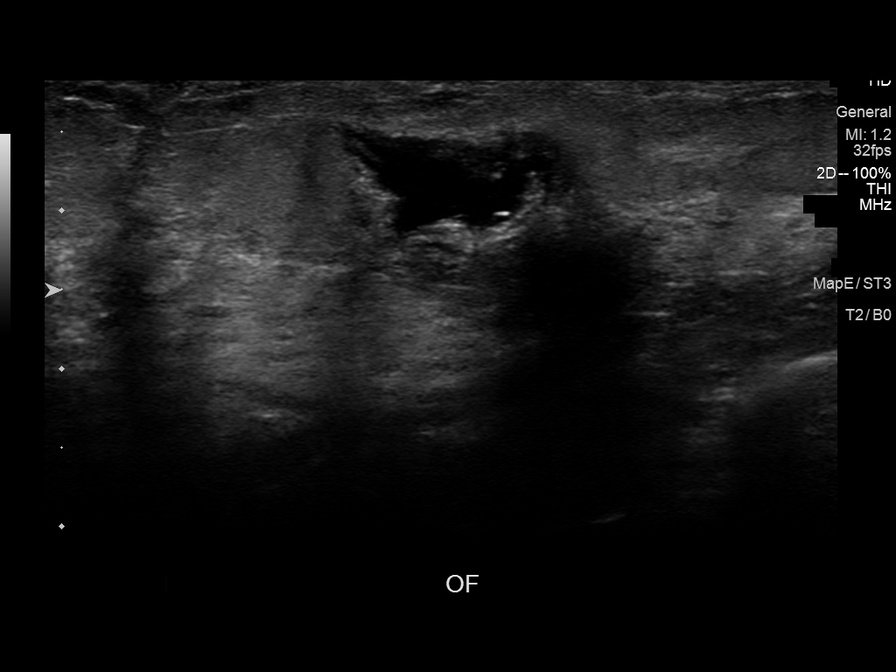

[9 of 9 positions shown; findings below may reference images not displayed]

FINDINGS: On physical exam, there mild residual pinkness overlying an area of
firm tissue in the LEFT upper outer breast.

Targeted ultrasound was performed of the site of palpable concern.
There is a superficial heterogeneous masslike area in the
superficial fat with a small amount of internal fluid which spans
approximately 16 x 12 x 16 mm.
IMPRESSION: 1. At site of palpable concern in the LEFT breast, there is a
heterogeneous area which measures 16 mm. Differential considerations
include phlegmon versus resolving hematoma. Double-strength Bactrim
was prescribed for the patient given history of infectious
symptomatology and history of a LEFT-sided nipple ring. Recommend
follow-up ultrasound in 2-3 weeks to assess for appropriate
evolution.

RECOMMENDATION:
1. LEFT breast ultrasound in 2-3 weeks to assess for appropriate
evolution.
2. LEFT breast aspiration with potential conversion to biopsy can be
scheduled for same day and performed if deemed necessary.

I have discussed the findings and recommendations with the patient.
If applicable, a reminder letter will be sent to the patient
regarding the next appointment.

BI-RADS CATEGORY  3: Probably benign.

## 2022-12-18 ENCOUNTER — Other Ambulatory Visit: Payer: Self-pay | Admitting: Family Medicine

## 2022-12-18 MED ORDER — AMPHETAMINE-DEXTROAMPHET ER 20 MG PO CP24: 20.0000 mg | ORAL_CAPSULE | Freq: Every day | ORAL | 0 refills | Status: DC

## 2022-12-31 IMAGING — US US BREAST*L* LIMITED INC AXILLA
1 series · 8 of 8 positions shown · non-contrast
Comparison: Previous exam(s).

CLINICAL DATA: Follow-up of a probable abscess. Patient was
prescribed Bactrim. Patient reports improvement in the size of the
area and reports decreased discharge during cleaning of nipple
piercing

EXAM:
ULTRASOUND OF THE LEFT BREAST

[Series 1: us breast*left* limited inc axilla · 0.05mm/px · 8 of 8 slices shown]
[im 1/8]
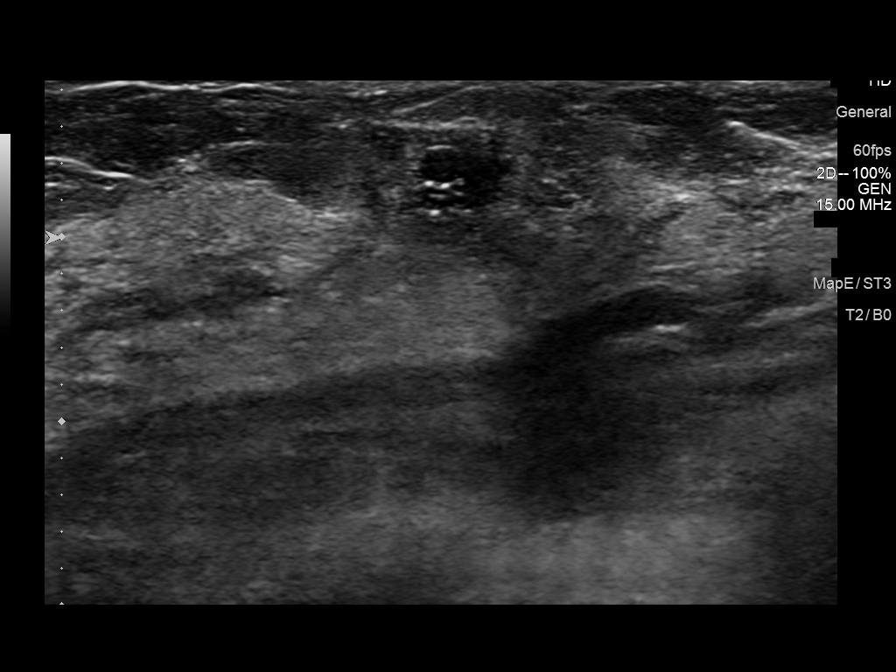
[im 2/8]
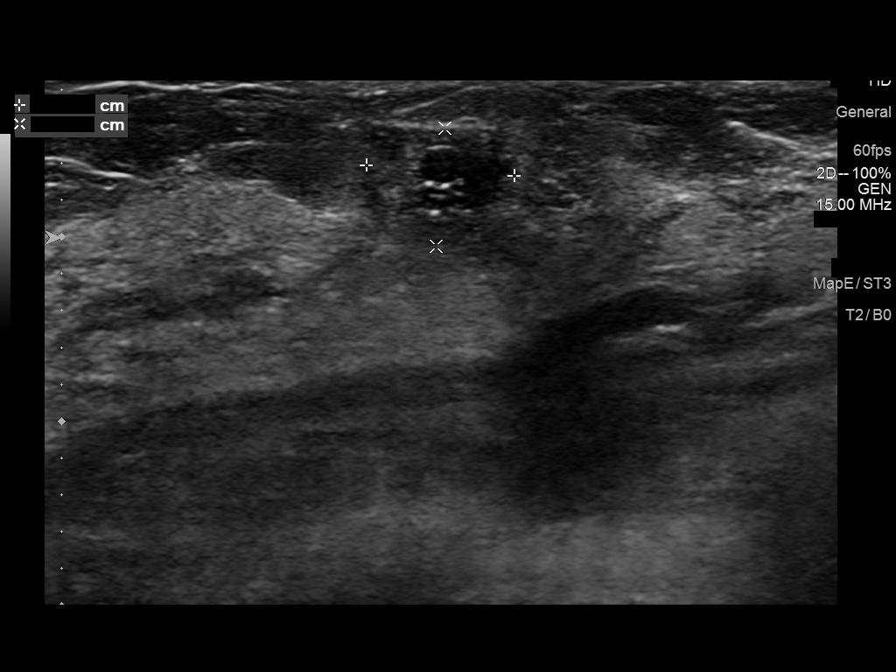
[im 3/8]
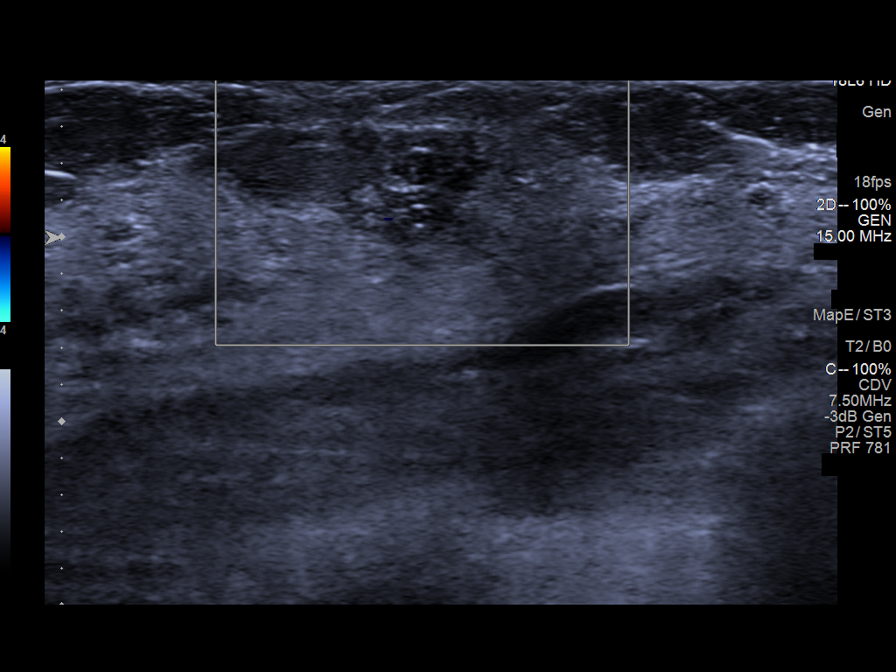
[im 4/8]
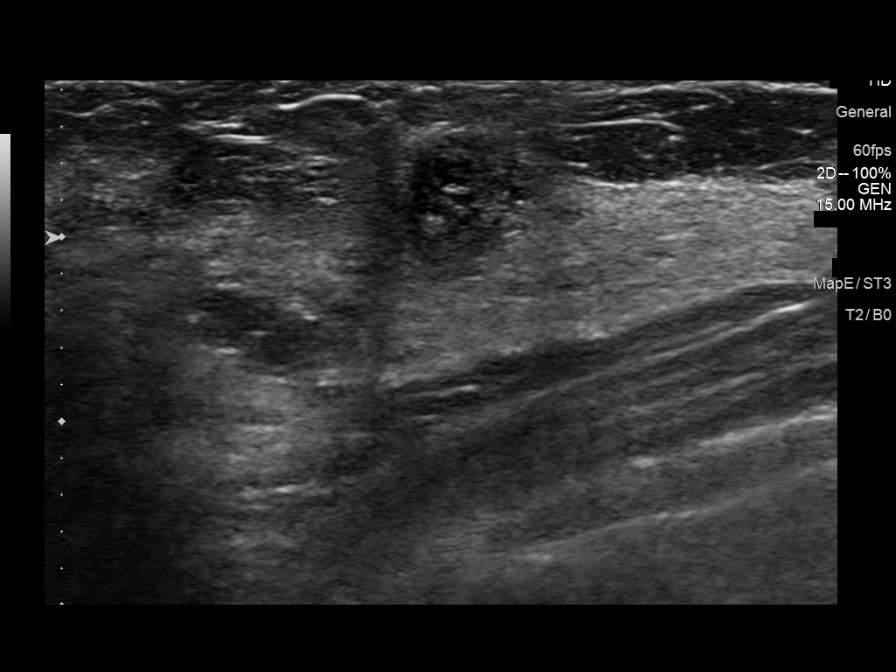
[im 5/8]
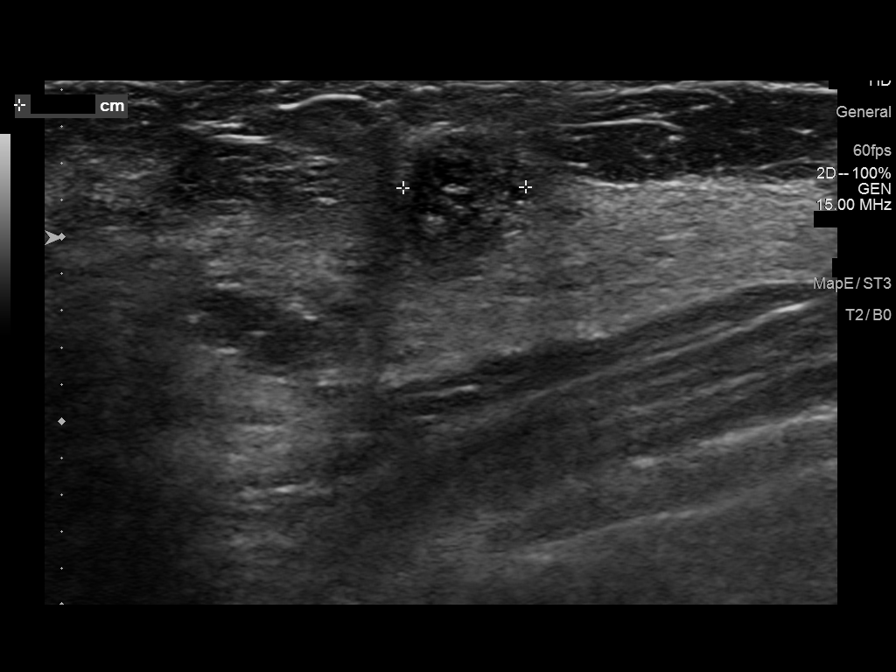
[im 6/8]
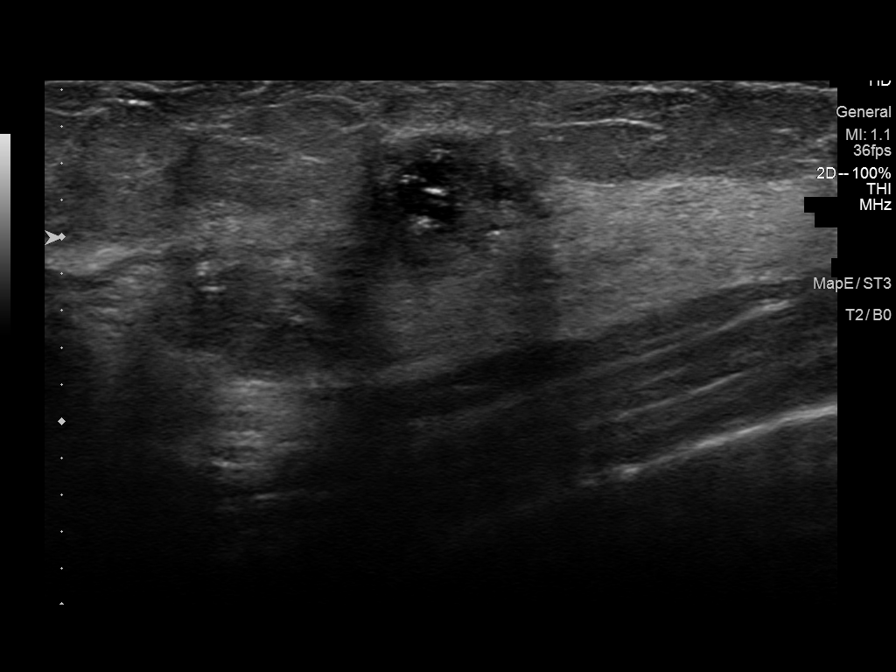
[im 7/8]
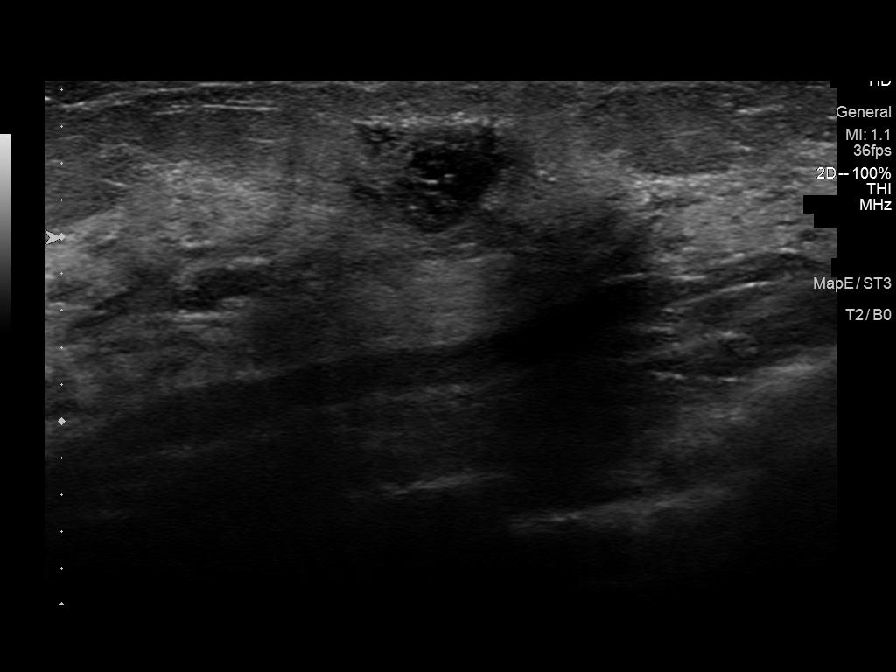
[im 8/8]
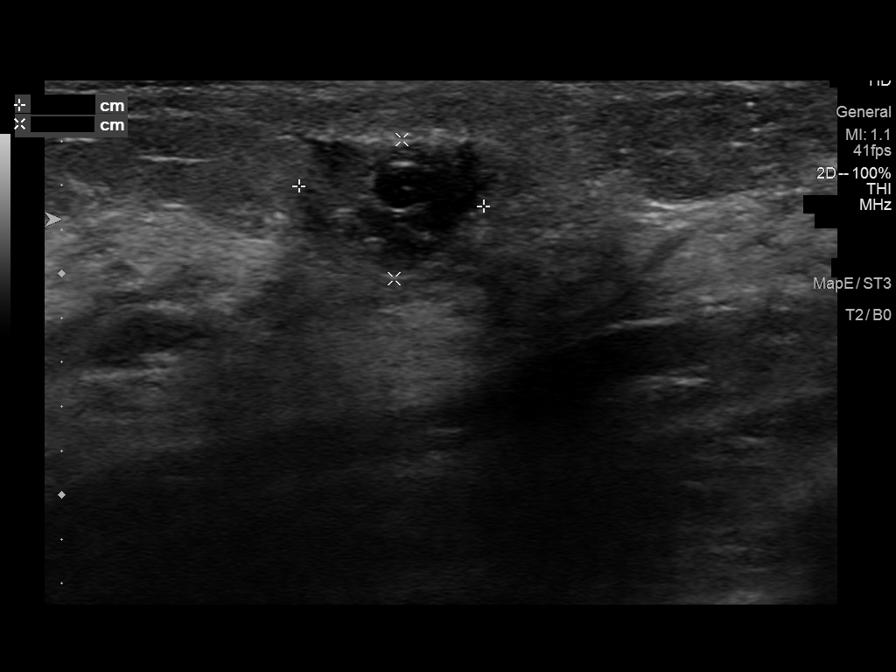

[8 of 8 positions shown; findings below may reference images not displayed]

FINDINGS: On physical exam, there is decreased superficial pinkness overlying
the area of palpable concern.

Targeted ultrasound was performed. At 2 o'clock 3 cm from nipple,
there is revisualization of a mildly hypoechoic rind of tissue with
trace internal anechoic fluid. Overall, the amount of fluid is
decreased in comparison to prior. The hypoechoic rind also decreased
in size in comparison to prior. This is most consistent with a
resolving phlegmon. It measures 8 x 6 x 7 mm, previously 16 x 16 x
12 mm.
IMPRESSION: Decrease in size of the site of palpable concern in the LEFT breast
measuring up to 8 mm on today's exam, previously 16 mm. This likely
reflects a resolving phlegmon. No significant focal drainable fluid
collection. Given residual hypoechoic rind of tissue, recommend
follow-up ultrasound in 3 months to assess for continued
improvement/resolution.

RECOMMENDATION:
LEFT breast ultrasound in 3 months.

Patient was instructed to return sooner if she noted increase in
size of area or worsening of symptoms.

I have discussed the findings and recommendations with the patient.
If applicable, a reminder letter will be sent to the patient
regarding the next appointment.

BI-RADS CATEGORY  3: Probably benign.

## 2023-02-15 ENCOUNTER — Other Ambulatory Visit: Payer: Self-pay | Admitting: Family Medicine

## 2023-02-18 MED ORDER — AMPHETAMINE-DEXTROAMPHET ER 20 MG PO CP24: 20.0000 mg | ORAL_CAPSULE | Freq: Every day | ORAL | 0 refills | Status: DC

## 2023-02-22 ENCOUNTER — Other Ambulatory Visit: Payer: Self-pay | Admitting: Family Medicine

## 2023-02-22 NOTE — Telephone Encounter (Signed)
Resend to [facility] on [address].

## 2023-02-22 NOTE — Telephone Encounter (Signed)
Medication Refill - Medication:  amphetamine-dextroamphetamine (ADDERALL XR) 20 MG 24 hr capsule  Has the patient contacted their pharmacy? Yes, pharmacy advised patient to contact us to send to another pharmacy due to they do not have the prescription, the other pharmacy was completely out and needs this sent to new pharmacy below  Preferred Pharmacy (with phone number or street name):  [facility] #[id number] - [place], [place] - [address] AT [facility] OF [address] Phone: [phone]  Fax: [phone]      *Patient wants this to be her primary Pharmacy*  Has the patient been seen for an appointment in the last year OR does the patient have an upcoming appointment? Yes

## 2023-02-22 NOTE — Telephone Encounter (Signed)
Requested medication (s) are due for refill today - no  Requested medication (s) are on the active medication list -yes  Future visit scheduled -no  Last refill: 02/22/23  Notes to clinic: request reroutes different pharmacy- see note- out at original pharmacy, non delegated Rx  Requested Prescriptions  Pending Prescriptions Disp Refills   amphetamine-dextroamphetamine (ADDERALL XR) 20 MG 24 hr capsule 90 capsule 0    Sig: Take 1 capsule (20 mg total) by mouth daily.     Not Delegated - Psychiatry:  Stimulants/ADHD Failed - 02/22/2023  8:38 AM      Failed - This refill cannot be delegated      Failed - Urine Drug Screen completed in last 360 days      Failed - Valid encounter within last 6 months    Recent Outpatient Visits           6 months ago Hematuria, unspecified type   [place] [facility] [place], [place], PA-C   7 months ago Urinary tract infection with hematuria, site unspecified   [place] [facility] [place], [place], PA-C   8 months ago Acute non-recurrent pansinusitis   [facility] Merita Norton T, FNP   9 months ago Migraine with aura and without status migrainosus, not intractable   [facility] Merita Norton T, FNP   1 year ago Breast pain, left   [facility] Merita Norton T, FNP              Passed - Last BP in normal range    BP Readings from Last 1 Encounters:  10/13/22 115/80         Passed - Last Heart Rate in normal range    Pulse Readings from Last 1 Encounters:  10/13/22 (!) 110            Requested Prescriptions  Pending Prescriptions Disp Refills   amphetamine-dextroamphetamine (ADDERALL XR) 20 MG 24 hr capsule 90 capsule 0    Sig: Take 1 capsule (20 mg total) by mouth daily.     Not Delegated - Psychiatry:  Stimulants/ADHD Failed - 02/22/2023  8:38 AM      Failed - This refill cannot be delegated      Failed - Urine Drug  Screen completed in last 360 days      Failed - Valid encounter within last 6 months    Recent Outpatient Visits           6 months ago Hematuria, unspecified type   [place] [facility] [place], [place], PA-C   7 months ago Urinary tract infection with hematuria, site unspecified   [place] [facility] [place], [place], PA-C   8 months ago Acute non-recurrent pansinusitis   [facility] Merita Norton T, FNP   9 months ago Migraine with aura and without status migrainosus, not intractable   [place] [facility] Merita Norton T, FNP   1 year ago Breast pain, left   [facility] Merita Norton T, FNP              Passed - Last BP in normal range    BP Readings from Last 1 Encounters:  10/13/22 115/80         Passed - Last Heart Rate in normal range    Pulse Readings from Last 1 Encounters:  10/13/22 (!) 110

## 2023-02-25 ENCOUNTER — Telehealth: Payer: Self-pay | Admitting: Family Medicine

## 2023-02-25 NOTE — Telephone Encounter (Signed)
Patient called and states she has been having trouble getting her medication, amphetamine-dextroamphetamine (ADDERALL XR) 20 MG 24 hr capsule, due to both of the [facility] was sent to has been out of stock of the medication and patient wanted to know could she stop by today 11.4.2024 and pick up a hand written prescription for medication so she can take to a smaller pharmacy herself to get this filled? Patient states she is close by and can come by. Please advise. Please follow back up with patient @  #[phone].

## 2023-02-25 NOTE — Telephone Encounter (Signed)
Pt stated that she really needs to pick up the Rx for amphetamine-dextroamphetamine (ADDERALL XR) 20 MG 24 hr capsule by noon tomorrow at the latest. Pt stated she can not work without the medication so if she does not get the Rx she will have to use FMLA. Pt requests call back asap to advise. Cb# [phone]

## 2023-02-26 NOTE — Telephone Encounter (Signed)
LMTCB- Ok for [facility] to give provider's message Jacky Kindle, FNP   I am happy to provide a work note if needed; was the Rx filled?

## 2023-02-26 NOTE — Telephone Encounter (Signed)
Received call from pt. And shared note from provider.   Pt has not been able to get her medication. Many pharmacies are out of the "XR" formulation.  Tomorrow pt will call around to find a pharmacy that has the medication in stock. When she locates one, she will call back to give the details so we can send in the refill to a pharmacy that has the medication in stock.  Pt was also wondering if she could have a "paper" prescription that she could take to the pharmacy for filling.

## 2023-02-27 ENCOUNTER — Other Ambulatory Visit: Payer: Self-pay | Admitting: Family Medicine

## 2023-02-27 ENCOUNTER — Telehealth: Payer: Self-pay | Admitting: Family Medicine

## 2023-02-27 ENCOUNTER — Encounter: Payer: Self-pay | Admitting: Family Medicine

## 2023-02-27 MED ORDER — AMPHETAMINE-DEXTROAMPHETAMINE 10 MG PO TABS: 10.0000 mg | ORAL_TABLET | Freq: Two times a day (BID) | ORAL | 0 refills | Status: DC

## 2023-02-27 MED ORDER — AMPHETAMINE-DEXTROAMPHET ER 20 MG PO CP24: 20.0000 mg | ORAL_CAPSULE | Freq: Every day | ORAL | 0 refills | Status: DC

## 2023-02-27 NOTE — Telephone Encounter (Signed)
Patient called, left VM medication was sent to [facility] on [address] on 02/18/23, call them and if it's not there to call the office back to have it resent.

## 2023-02-27 NOTE — Telephone Encounter (Signed)
Patient would like amphetamine-dextroamphetamine (ADDERALL XR) 20 MG 24 hr capsule  sent to [facility] on [address]. Patient said she did confirm with pharmacy that they have the medication in stock. [facility] #[id number] [place], [place] - [address] AT [facility] OF [address] & [facility] [address] Phone: [phone]  Fax: [phone]

## 2023-02-27 NOTE — Telephone Encounter (Signed)
Last read by Kingsley Plan at 12:53 PM on 02/27/2023.   Patient read mychart message and responded.

## 2023-03-01 ENCOUNTER — Telehealth: Payer: Self-pay | Admitting: Family Medicine

## 2023-03-01 NOTE — Telephone Encounter (Signed)
Patient called Eileen Davis she picked up the amphetamine-dextroamphetamine (ADDERALL) 10MG  tablet script and she dropped off the script for amphetamine-dextroamphetamine (ADDERALL XR) 20 MG 24 hr capsule saying she can pick it up per the way the script read that said she could only get one or the other. Please have provider call pharmacy to get this corrected so she can get the 20mg  24hr extended release fills.

## 2023-03-01 NOTE — Telephone Encounter (Signed)
[facility] #[id number] [place], [place] - [address] AT [facility] OF [address] & [facility] [address] Phone: [phone]  Fax: [phone]    Calling to get clarity on script amphetamine-dextroamphetamine (ADDERALL) 10 MG tablet [[id number]]  & amphetamine-dextroamphetamine (ADDERALL XR) 20 MG 24 hr capsule [[id number]] . Wanting to know which one to fill? Please advise

## 2023-03-04 NOTE — Telephone Encounter (Signed)
Patient was trying to fill both the 10 mg BID and the 20 mg every day. Pharmacy states that she has just picked up the 10mg  so they have been asked to wait until she is due again to fill the 20 mg.

## 2023-03-06 ENCOUNTER — Other Ambulatory Visit: Payer: Self-pay | Admitting: Family Medicine

## 2023-03-06 ENCOUNTER — Telehealth: Payer: Self-pay | Admitting: Family Medicine

## 2023-03-06 MED ORDER — AMPHETAMINE-DEXTROAMPHET ER 20 MG PO CP24: 20.0000 mg | ORAL_CAPSULE | Freq: Every day | ORAL | 0 refills | Status: DC

## 2023-03-06 NOTE — Telephone Encounter (Signed)
Eileen Davis with [employer] states that she talked to someone at the office two days ago and the pt picked up a 30 day supply of medication on 03/01/2023. They did not have 20 mg and had to it for 10 mg. Eileen Davis states that the pt PCP keep sending over a 90 day supply and in the state of [place] legally you can only send over a 30 day supply.

## 2023-04-23 ENCOUNTER — Other Ambulatory Visit: Payer: Self-pay | Admitting: Family Medicine

## 2023-04-23 NOTE — Telephone Encounter (Signed)
 Medication Refill -  Most Recent Primary Care Visit:  Provider: OSTWALT, JANNA  Department: [facility]  Visit Type: OFFICE VISIT  Date: 08/16/2022  Medication: amphetamine -dextroamphetamine  (ADDERALL XR) 20 MG 24 hr capsule   Has the patient contacted their pharmacy? Yes  (Agent: If yes, when and what did the pharmacy advise?)  Is this the correct pharmacy for this prescription? Yes If no, delete pharmacy and type the correct one.  This is the patient's preferred pharmacy:   [facility] #[id number] [place], [place] - [address] AT [facility] OF [facility] [address] & [facility] [address] [place] [address] Phone: [phone] Fax: [phone]   Has the prescription been filled recently? No  Is the patient out of the medication? Yes  Has the patient been seen for an appointment in the last year OR does the patient have an upcoming appointment? Yes  Can we respond through MyChart? Yes  Agent: Please be advised that Rx refills may take up to 3 business days. We ask that you follow-up with your pharmacy.

## 2023-04-27 NOTE — Telephone Encounter (Signed)
 Requested medication (s) are due for refill today: Yes  Requested medication (s) are on the active medication list: Yes  Last refill:  03/06/23  Future visit scheduled: No  Notes to clinic:  Unable to refill per protocol, cannot delegate.      Requested Prescriptions  Pending Prescriptions Disp Refills   amphetamine -dextroamphetamine  (ADDERALL XR) 20 MG 24 hr capsule 90 capsule 0    Sig: Take 1 capsule (20 mg total) by mouth daily.     Not Delegated - Psychiatry:  Stimulants/ADHD Failed - 04/27/2023  9:18 AM      Failed - This refill cannot be delegated      Failed - Urine Drug Screen completed in last 360 days      Failed - Valid encounter within last 6 months    Recent Outpatient Visits           8 months ago Hematuria, unspecified type   [place] [facility] [place], [place], PA-C   9 months ago Urinary tract infection with hematuria, site unspecified   [facility] [place], [place], PA-C   10 months ago Acute non-recurrent pansinusitis   [facility] Emilio Marseille T, FNP   11 months ago Migraine with aura and without status migrainosus, not intractable   [facility] Emilio Marseille DASEN, FNP   1 year ago Breast pain, left   [facility] Emilio Marseille T, FNP              Passed - Last BP in normal range    BP Readings from Last 1 Encounters:  10/13/22 115/80         Passed - Last Heart Rate in normal range    Pulse Readings from Last 1 Encounters:  10/13/22 (!) 110

## 2023-04-29 ENCOUNTER — Other Ambulatory Visit: Payer: Self-pay

## 2023-06-17 ENCOUNTER — Telehealth: Payer: Self-pay | Admitting: Physician Assistant

## 2023-06-17 NOTE — Telephone Encounter (Signed)
 Left vm needs appt before meds can be refilled

## 2023-06-17 NOTE — Telephone Encounter (Signed)
 [facility] pharmacy is refill propranolol (INDERAL) 40 MG tablet   & sertraline (ZOLOFT) 100 MG tablet  Please advise

## 2023-06-20 ENCOUNTER — Ambulatory Visit: Payer: 59 | Admitting: Physician Assistant

## 2023-06-20 ENCOUNTER — Encounter: Payer: Self-pay | Admitting: Physician Assistant

## 2023-06-20 VITALS — BP 116/85 | HR 69 | Resp 18 | Ht 62.0 in | Wt 136.7 lb

## 2023-06-20 DIAGNOSIS — F419 Anxiety disorder, unspecified: Secondary | ICD-10-CM

## 2023-06-20 DIAGNOSIS — Z800 Family history of malignant neoplasm of digestive organs: Secondary | ICD-10-CM

## 2023-06-20 DIAGNOSIS — G43109 Migraine with aura, not intractable, without status migrainosus: Secondary | ICD-10-CM

## 2023-06-20 DIAGNOSIS — Z1371 Encounter for nonprocreative screening for genetic disease carrier status: Secondary | ICD-10-CM

## 2023-06-20 DIAGNOSIS — F32A Depression, unspecified: Secondary | ICD-10-CM

## 2023-06-20 DIAGNOSIS — Z7183 Encounter for nonprocreative genetic counseling: Secondary | ICD-10-CM

## 2023-06-20 DIAGNOSIS — F909 Attention-deficit hyperactivity disorder, unspecified type: Secondary | ICD-10-CM

## 2023-06-20 MED ORDER — AMPHETAMINE-DEXTROAMPHET ER 20 MG PO CP24: 20.0000 mg | ORAL_CAPSULE | Freq: Every day | ORAL | 0 refills | Status: DC

## 2023-06-20 MED ORDER — SERTRALINE HCL 100 MG PO TABS
100.0000 mg | ORAL_TABLET | Freq: Every day | ORAL | 3 refills | Status: AC
Start: 2023-06-20 — End: ?

## 2023-06-20 MED ORDER — PROPRANOLOL HCL 40 MG PO TABS: 40.0000 mg | ORAL_TABLET | Freq: Two times a day (BID) | ORAL | 3 refills | Status: AC

## 2023-06-20 NOTE — Progress Notes (Signed)
 Established patient visit  Patient: Eileen Davis   DOB: 01-12-1991   33 y.o. Female  MRN: [id number] Visit Date: 06/20/2023  Today's healthcare provider: Debera Lat, PA-C   Chief Complaint  Patient presents with   Medical Management of Chronic Issues   Subjective       Discussed the use of AI scribe software for clinical note transcription with the patient, who gave verbal consent to proceed.  History of Present Illness   The patient, a paramedic, presents for a medication refill and to discuss her mother's history of colon cancer. She reports that her ADHD, depression, and anxiety have been well-managed with her current medication regimen, which includes Adderall, sertraline, and propranolol. The patient takes Adderall only when at work or when she has a full day of tasks to complete. She also has a lower dose of Adderall that she takes on days when she is not at work. The patient reports that she has been doing well overall with her ADHD management.  The patient's mother was diagnosed with colon cancer in her mid-sixties and passed away a year later. The patient's mother's cancer was not genetically linked, but the patient expresses interest in undergoing genetic testing due to her family history of cancer, which also includes ovarian cancer in her maternal grandmother.           06/20/2023    8:23 AM 08/16/2022    1:42 PM 07/26/2022   11:01 AM  Depression screen PHQ 2/9  Decreased Interest 0 0 1  Down, Depressed, Hopeless 0 0 0  PHQ - 2 Score 0 0 1  Altered sleeping 1 1 2   Tired, decreased energy 1 0 1  Change in appetite 0 0 1  Feeling bad or failure about yourself  0 0 0  Trouble concentrating 0 0 0  Moving slowly or fidgety/restless 0 0 2  Suicidal thoughts 0 0 0  PHQ-9 Score 2 1 7   Difficult doing work/chores Somewhat difficult Not difficult at all Very difficult      06/20/2023    8:23 AM 05/24/2022    4:05 PM 05/13/2019    1:40 PM  GAD 7 : Generalized Anxiety  Score  Nervous, Anxious, on Edge 0 1 0  Control/stop worrying 0 1 0  Worry too much - different things 0 1 0  Trouble relaxing 0 1 1  Restless 0 0 0  Easily annoyed or irritable 0 0 0  Afraid - awful might happen 0 1 0  Total GAD 7 Score 0 5 1  Anxiety Difficulty  Somewhat difficult Somewhat difficult    Medications: Outpatient Medications Prior to Visit  Medication Sig   etonogestrel (NEXPLANON) 68 MG IMPL implant 1 each by Subdermal route once. Inserted 08/24/2020 by Hildred Laser, MD [facility] [place]   traZODone (DESYREL) 50 MG tablet Take 0.5-1 tablets (25-50 mg total) by mouth at bedtime as needed for sleep.   [DISCONTINUED] amphetamine-dextroamphetamine (ADDERALL XR) 20 MG 24 hr capsule Take 1 capsule (20 mg total) by mouth daily.   [DISCONTINUED] propranolol (INDERAL) 40 MG tablet Take 1 tablet (40 mg total) by mouth 2 (two) times daily. Take one pill twice daily.   [DISCONTINUED] sertraline (ZOLOFT) 100 MG tablet Take 1 tablet (100 mg total) by mouth daily.   No facility-administered medications prior to visit.    Review of Systems All negative Except see HPI       Objective    BP 116/85   Pulse 69   Resp  18   Ht 5\' 2"  (1.575 m)   Wt 136 lb 11.2 oz (62 kg)   SpO2 97%   BMI 25.00 kg/m     Physical Exam Vitals reviewed.  Constitutional:      General: She is not in acute distress.    Appearance: Normal appearance. She is well-developed. She is not diaphoretic.  HENT:     Head: Normocephalic and atraumatic.  Eyes:     General: No scleral icterus.    Conjunctiva/sclera: Conjunctivae normal.  Neck:     Thyroid: No thyromegaly.  Cardiovascular:     Rate and Rhythm: Normal rate and regular rhythm.     Pulses: Normal pulses.     Heart sounds: Normal heart sounds. No murmur heard. Pulmonary:     Effort: Pulmonary effort is normal. No respiratory distress.     Breath sounds: Normal breath sounds. No wheezing, rhonchi or rales.  Musculoskeletal:     Cervical  back: Neck supple.     Right lower leg: No edema.     Left lower leg: No edema.  Lymphadenopathy:     Cervical: No cervical adenopathy.  Skin:    General: Skin is warm and dry.     Findings: No rash.  Neurological:     Mental Status: She is alert and oriented to person, place, and time. Mental status is at baseline.  Psychiatric:        Mood and Affect: Mood normal.        Behavior: Behavior normal.      No results found for any visits on 06/20/23.      Assessment and Plan     Migraine with aura and without status migrainosus, not intractable Chronic and stable on propranolol Continue - propranolol (INDERAL) 40 MG tablet; Take 1 tablet (40 mg total) by mouth 2 (two) times daily. Take one pill twice daily.  Dispense: 180 tablet; Refill: 3 Will follow-up  Anxiety and depression - sertraline (ZOLOFT) 100 MG tablet; Take 1 tablet (100 mg total) by mouth daily.  Dispense: 90 tablet; Refill: 3  Attention deficit hyperactivity disorder (ADHD), unspecified ADHD type (Primary) - amphetamine-dextroamphetamine (ADDERALL XR) 20 MG 24 hr capsule; Take 1 capsule (20 mg total) by mouth daily.  Dispense: 30 capsule; Refill: 0   Depression and Anxiety Chronic Well controlled on current regimen of Sertraline and Propranolol. -Continue Sertraline and Propranolol as prescribed. Recommended relaxation techniques  Attention Deficit Hyperactivity Disorder (ADHD) Chronic Well controlled on current regimen of Adderall XR 20mg  and Adderall 10mg . Patient reports only taking Adderall XR 20mg  for work and rarely uses Adderall 10mg . -Continue Adderall XR 20mg  as needed for work. -Check in for refill towards the end of each month to ensure continuity of medication. Patient was diagnosed and evaluated as an adult and after confirmation of diagnosis started on Adderall  Family History of Colon Cancer Patient's mother diagnosed with colon cancer at age 69, passed away in June 28, 2021. No genetic link  identified in mother's cancer. Patient expresses interest in genetic testing. -Refer to Hematology/Oncology for genetic testing consultation.  Follow-up -Schedule appointment in three months to reassess condition and medication effectiveness. -Advise patient to schedule appointments before medication runs out to avoid gaps in medication use.     Established with new provider No orders of the defined types were placed in this encounter.   Return in about 3 months (around 09/17/2023) for chronic disease f/u, anxiety fu.  Consider ordering labs  The patient was advised to call back  or seek an in-person evaluation if the symptoms worsen or if the condition fails to improve as anticipated.  I discussed the assessment and treatment plan with the patient. The patient was provided an opportunity to ask questions and all were answered. The patient agreed with the plan and demonstrated an understanding of the instructions.  I, Debera Lat, PA-C have reviewed all documentation for this visit. The documentation on 06/20/2023  for the exam, diagnosis, procedures, and orders are all accurate and complete.  Debera Lat, [facility], MMS [facility] [phone] (phone) [phone] (fax)  [facility]

## 2023-08-03 NOTE — Progress Notes (Signed)
 Subjective:   Chief Complaint  Patient presents with  . Urinary Urgency    Symptoms started about a week ago. Pt is certain she has a UTI. Bleeding as well, thinks the bleeding is from needing her nexplanon  replaced      History of Present Illness The patient is a 33 year old female who presents for evaluation of urinary urgency and personal concern for a urinary tract infection (UTI).  She reports experiencing dysuria and urinary urgency since this morning, accompanied by suprapubic pain that started around 7:30 AM. She has not experienced any fevers or back pain. She also notes the presence of hematuria, which she initially attributed to her menstrual cycle, as she is due for a Nexplanon  replacement. She has a history of UTIs, typically presenting with similar symptoms. Her last UTI episode required emergency department intervention due to symptom escalation, necessitating intravenous antibiotics and an additional oral antibiotic course outpatient. She has a history of urethral reflux on the right side.  Parts of patient history reviewed include PMH, problem list, medications, allergies, and social history.  Objective:   Vitals:   08/03/23 1002  BP: 116/79  Pulse: 88  Resp: 16  Temp: 97 F (36.1 C)  TempSrc: Tympanic  SpO2: 96%  Weight: 59 kg (130 lb)  Height: 1.575 m (5' 2)    Physical Exam Vitals and nursing note reviewed.  Constitutional:      General: She is not in acute distress.    Appearance: Normal appearance. She is normal weight. She is not ill-appearing or toxic-appearing.  HENT:     Head: Normocephalic and atraumatic.  Cardiovascular:     Rate and Rhythm: Normal rate.  Pulmonary:     Effort: Pulmonary effort is normal.  Abdominal:     General: Abdomen is flat. Bowel sounds are normal.     Palpations: Abdomen is soft.     Tenderness: There is abdominal tenderness in the suprapubic area. There is no right CVA tenderness, left CVA tenderness, guarding or  rebound.  Skin:    General: Skin is warm.     Capillary Refill: Capillary refill takes less than 2 seconds.  Neurological:     Mental Status: She is alert and oriented to person, place, and time. Mental status is at baseline.     Results for orders placed or performed in visit on 08/03/23  POC Urinalysis Auto without Microscopic   Collection Time: 08/03/23 10:07 AM  Result Value Ref Range   Color, Urine Light Yellow Yellow   Clarity, Urine Clear Clear   Glucose, Urine Negative Negative mg/dL   Bilirubin, Urine Negative Negative   Ketones, Urine Negative Negative mg/dL   Specific Gravity, Urine <=1.005 (A) 1.010, 1.015, 1.020, 1.025   Blood, Urine Small (A) Negative   pH, Urine 6.5 5.0, 5.5, 6.0, 6.5, 7.0, 7.5, 8.0   Protein, Urine Negative Negative mg/dL   Urobilinogen, Urine 0.2 <2.0 mg/dL   Nitrite, Urine Negative Negative   Leukocyte Esterase, Urine Small (A) Negative   Kit/Device Lot # [id number]    Kit/Device Expiration Date 83125   POC HCG Qualitative, Urine   Collection Time: 08/03/23 10:13 AM  Result Value Ref Range   HCG, Urine, POC Negative Negative   Internal Control Acceptable    Kit/Device Lot # [id number]    Kit/Device Expiration Date 1687973     Lab work reviewed and incorporated into the decision making process    Assessment/Plan:   Assessment & Plan 1. Urinary Tract Infection (UTI) -Macrobid   -  Urine Culture   This patient presents with symptoms consistent with acute uncomplicated cystitis. No systemic symptoms. Not septic. Well appearing. Low suspicion for acute pyelonephritis given lack of fever, CVAT, or systemic features. Low suspicion for kidney stone or infected stone. Upreg negative so doubt ectopic pregnancy. Low suspicion for ovarian torsion, PID, or appendicitis. New Rx for Macrobid . She is advised to start the antibiotics immediately. If symptoms worsen or do not improve, she should seek emergency care.   Home Care   Patient has been instructed on  medications, dosages, side effects, and possible interactions as associated with each diagnosis in my impression and plan above. Patient education (verbal/handout) given on diagnosis, pathophysiology, treatment of diagnosis, side effects of medication use for treatment, restrictions while taking medication, and supportive measures.   Patient was instructed on when to follow up and know that they can follow up here, with their PCP, Urgent Care, ED.  They have been instructed that if symptoms worsen that should return to the clinic, go to the nearest ED, or activate EMS. Red Flags associated with their diagnoses were reviewed and patient was educated on what to do if red.       Patient agreed with plan and voiced understanding.  No barriers to adherence perceived by myself.  Portions of this note may have been dictated using Dragon dictation software/hardware and may contain grammatical or spelling errors.   Electronically signed by:   Sotero Pore, DNP FNP-C [facility]  08/03/2023 10:16 AM

## 2023-08-06 NOTE — Progress Notes (Signed)
 Since patient reports symptom resolution, no additional medication is needed.  She should finish the macrobid  and return if symptoms come back.

## 2023-08-27 ENCOUNTER — Other Ambulatory Visit: Payer: Self-pay | Admitting: Physician Assistant

## 2023-08-27 DIAGNOSIS — F909 Attention-deficit hyperactivity disorder, unspecified type: Secondary | ICD-10-CM

## 2023-08-30 MED ORDER — AMPHETAMINE-DEXTROAMPHET ER 20 MG PO CP24
20.0000 mg | ORAL_CAPSULE | Freq: Every day | ORAL | 0 refills | Status: DC
Start: 2023-08-30 — End: 2023-11-05

## 2023-09-03 ENCOUNTER — Inpatient Hospital Stay: Attending: Licensed Clinical Social Worker | Admitting: Licensed Clinical Social Worker

## 2023-09-03 ENCOUNTER — Inpatient Hospital Stay

## 2023-09-03 NOTE — Progress Notes (Deleted)
 REFERRING PROVIDER: Ostwalt, Janna, PA-C [address] [place],  [place] [address]  PRIMARY PROVIDER:  Blane Bunting, PA-C  PRIMARY REASON FOR VISIT:  1. Family history of colon cancer   2. Family history of ovarian cancer   3. Family history of prostate cancer      HISTORY OF PRESENT ILLNESS:   Eileen Davis, a 33 y.o. female, was seen for a [place] cancer genetics consultation at the request of Dr. Renay Carota due to a family history of cancer.  Eileen Davis presents to clinic today to discuss the possibility of a hereditary predisposition to cancer, genetic testing, and to further clarify her future cancer risks, as well as potential cancer risks for family members.   CANCER HISTORY:  Eileen Davis is a 33 y.o. female with no personal history of cancer.    RISK FACTORS:  Menarche was at age ***.  First live birth at age ***.  OCP use for approximately {Numbers 1-12 multi-select:20307} years.  Ovaries intact: {Yes/No-Ex:120004}.  Hysterectomy: {Yes/No-Ex:120004}.  Menopausal status: {Menopause:31378}.  HRT use: {Numbers 1-12 multi-select:20307} years. Colonoscopy: {Yes/No-Ex:120004}; {normal/abnormal/not examined:14677}. Mammogram within the last year: {Yes/No-Ex:120004}. Number of breast biopsies: {Numbers 1-12 multi-select:20307}. Up to date with pelvic exams: {Yes/No-Ex:120004}. Any excessive radiation exposure in the past: {Yes/No-Ex:120004}  Past Medical History:  Diagnosis Date   ADHD    Anxiety and depression    Depression    Migraine    Reactive airway disease 06/08/2022   Recurrent UTI    Ureteral reflux 2005   Right side    Past Surgical History:  Procedure Laterality Date   CERVICAL BIOPSY  W/ LOOP ELECTRODE EXCISION     WISDOM TOOTH EXTRACTION  06/2016    FAMILY HISTORY:  We obtained a detailed, 4-generation family history.  Significant diagnoses are listed below: Family History  Problem Relation Age of Onset   Osteoporosis Mother    Diabetes Father     Hypertension Father    Healthy Brother    ADD / ADHD Brother    Ovarian cancer Maternal Grandmother    Prostate cancer Maternal Grandfather    Congestive Heart Failure Paternal Grandmother    CVA Paternal Grandmother    Lung cancer Paternal Grandfather     Ms. Feeser is unaware of previous family history of genetic testing for hereditary cancer risks. There is no reported Ashkenazi Jewish ancestry. There is no known consanguinity.  GENETIC COUNSELING ASSESSMENT: Eileen Davis is a 33 y.o. female with a family history which is somewhat suggestive of a hereditary cancer syndrome and predisposition to cancer. We, therefore, discussed and recommended the following at today's visit.   DISCUSSION: We discussed that approximately 10% of colon cancer is hereditary. Most cases of hereditary colon cancer are associated with Lynch syndrome genes, which can also increase the risk for ovarian cancer, although there are other genes associated with hereditary cancer as well. Cancers and risks are gene specific. We discussed that testing is beneficial for several reasons including knowing about cancer risks, identifying potential screening and risk-reduction options that may be appropriate, and to understand if other family members could be at risk for cancer and allow them to undergo genetic testing.   We reviewed the characteristics, features and inheritance patterns of hereditary cancer syndromes. We also discussed genetic testing, including the appropriate family members to test, the process of testing, insurance coverage and turn-around-time for results. We discussed the implications of a negative, positive and/or variant of uncertain significant result. We recommended Eileen Davis pursue genetic  testing for the [facility] CancerNext+RNA gene panel.   The [facility] CancerNext+RNAinsight Panel includes sequencing, rearrangement analysis, and RNA analysis for the following 40 genes: APC, ATM, BAP1, BARD1, BMPR1A, BRCA1,  BRCA2, BRIP1, CDH1, CDKN2A, CHEK2, FH, FLCN, MET, MLH1, MSH2, MSH6, MUTYH, NF1, NTHL1, PALB2, PMS2, PTEN, RAD51C, RAD51D, RSP20, SMAD4, STK11, TP53, TSC1, TSC2, and VHL (sequencing and deletion/duplication); AXIN2, HOXB13, MBD4, MSH3, POLD1 and POLE (sequencing only); EPCAM and GREM1 (deletion/duplication only).  Based on Eileen Davis's family history of cancer, she meets medical criteria for genetic testing. Despite that she meets criteria, she may still have an out of pocket cost.   We discussed that some people do not want to undergo genetic testing due to fear of genetic discrimination.  A federal law called the Genetic Information Non-Discrimination Act (GINA) of 2008 helps protect individuals against genetic discrimination based on their genetic test results.  It impacts both health insurance and employment.  For health insurance, it protects against increased premiums, being kicked off insurance or being forced to take a test in order to be insured.  For employment it protects against hiring, firing and promoting decisions based on genetic test results.  Health status due to a cancer diagnosis is not protected under GINA.  This law does not protect life insurance, disability insurance, or other types of insurance.   PLAN: After considering the risks, benefits, and limitations, Eileen Davis provided informed consent to pursue genetic testing and the blood sample was sent to [facility] for analysis of the CancerNext+RNA panel. Results should be available within approximately 2-3 weeks' time, at which point they will be disclosed by telephone to Eileen Davis, as will any additional recommendations warranted by these results. Eileen Davis will receive a summary of her genetic counseling visit and a copy of her results once available. This information will also be available in Epic.   *** Despite our recommendation, Eileen Davis did not wish to pursue genetic testing at today's visit. We understand this decision  and remain available to coordinate genetic testing at any time in the future. We, therefore, recommend Eileen Davis continue to follow the cancer screening guidelines given by her primary healthcare provider.  ***Based on Eileen Davis's family history, we recommended her *** have genetic counseling and testing. Eileen Davis will let us  know if we can be of any assistance in coordinating genetic counseling and/or testing for this family member.   Eileen Davis questions were answered to her satisfaction today. Our contact information was provided should additional questions or concerns arise. Thank you for the referral and allowing us  to share in the care of your patient.   Valri Gee, MS, [facility] Genetic Counselor [email or web address] Phone: [phone]  *** minutes were spent on the date of the encounter in service to the patient including preparation, face-to-face consultation, documentation and care coordination. Dr. Nelson Bandy was available for discussion regarding this case.   _______________________________________________________________________ For Office Staff:  Number of people involved in session: *** Was an Intern/ student involved with case: no

## 2023-09-12 ENCOUNTER — Ambulatory Visit: Payer: 59 | Admitting: Physician Assistant

## 2023-09-20 ENCOUNTER — Other Ambulatory Visit: Payer: Self-pay | Admitting: Obstetrics

## 2023-09-20 ENCOUNTER — Ambulatory Visit
Admission: RE | Admit: 2023-09-20 | Discharge: 2023-09-20 | Disposition: A | Discharge status: Home / Self Care | Source: Ambulatory Visit | Attending: Obstetrics | Admitting: Obstetrics

## 2023-09-20 DIAGNOSIS — Z3046 Encounter for surveillance of implantable subdermal contraceptive: Secondary | ICD-10-CM

## 2023-10-10 NOTE — Progress Notes (Deleted)
 Established patient visit  Patient: Eileen Davis   DOB: 10-26-1990   33 y.o. Female  MRN: [id number] Visit Date: 10/11/2023  Today's healthcare provider: Blane Bunting, PA-C   No chief complaint on file.  Subjective       Discussed the use of AI scribe software for clinical note transcription with the patient, who gave verbal consent to proceed.  History of Present Illness        06/20/2023    8:23 AM 08/16/2022    1:42 PM 07/26/2022   11:01 AM  Depression screen PHQ 2/9  Decreased Interest 0 0 1  Down, Depressed, Hopeless 0 0 0  PHQ - 2 Score 0 0 1  Altered sleeping 1 1 2   Tired, decreased energy 1 0 1  Change in appetite 0 0 1  Feeling bad or failure about yourself  0 0 0  Trouble concentrating 0 0 0  Moving slowly or fidgety/restless 0 0 2  Suicidal thoughts 0 0 0  PHQ-9 Score 2 1 7   Difficult doing work/chores Somewhat difficult Not difficult at all Very difficult      06/20/2023    8:23 AM 05/24/2022    4:05 PM 05/13/2019    1:40 PM  GAD 7 : Generalized Anxiety Score  Nervous, Anxious, on Edge 0 1 0  Control/stop worrying 0 1 0  Worry too much - different things 0 1 0  Trouble relaxing 0 1 1  Restless 0 0 0  Easily annoyed or irritable 0 0 0  Afraid - awful might happen 0 1 0  Total GAD 7 Score 0 5 1  Anxiety Difficulty  Somewhat difficult Somewhat difficult    Medications: Outpatient Medications Prior to Visit  Medication Sig  . amphetamine -dextroamphetamine  (ADDERALL XR) 20 MG 24 hr capsule Take 1 capsule (20 mg total) by mouth daily.  . etonogestrel  (NEXPLANON ) 68 MG IMPL implant 1 each by Subdermal route once. Inserted 08/24/2020 by Teresa Fender, MD [facility],[place]  . propranolol  (INDERAL ) 40 MG tablet Take 1 tablet (40 mg total) by mouth 2 (two) times daily. Take one pill twice daily.  . sertraline  (ZOLOFT ) 100 MG tablet Take 1 tablet (100 mg total) by mouth daily.  . traZODone  (DESYREL ) 50 MG tablet Take 0.5-1 tablets (25-50 mg total) by mouth at  bedtime as needed for sleep.   No facility-administered medications prior to visit.    Review of Systems  All other systems reviewed and are negative. All negative Except see HPI   {Insert previous labs (optional):23779} {See past labs  Heme  Chem  Endocrine  Serology  Results Review (optional):1}   Objective    There were no vitals taken for this visit. {Insert last BP/Wt (optional):23777}{See vitals history (optional):1}   Physical Exam Vitals reviewed.  Constitutional:      General: She is not in acute distress.    Appearance: Normal appearance. She is well-developed. She is not diaphoretic.  HENT:     Head: Normocephalic and atraumatic.   Eyes:     General: No scleral icterus.    Conjunctiva/sclera: Conjunctivae normal.   Neck:     Thyroid: No thyromegaly.   Cardiovascular:     Rate and Rhythm: Normal rate and regular rhythm.     Pulses: Normal pulses.     Heart sounds: Normal heart sounds. No murmur heard. Pulmonary:     Effort: Pulmonary effort is normal. No respiratory distress.     Breath sounds: Normal breath sounds. No wheezing, rhonchi  or rales.   Musculoskeletal:     Cervical back: Neck supple.     Right lower leg: No edema.     Left lower leg: No edema.  Lymphadenopathy:     Cervical: No cervical adenopathy.   Skin:    General: Skin is warm and dry.     Findings: No rash.   Neurological:     Mental Status: She is alert and oriented to person, place, and time. Mental status is at baseline.   Psychiatric:        Mood and Affect: Mood normal.        Behavior: Behavior normal.     No results found for any visits on 10/11/23.      Assessment and Plan Assessment & Plan     No orders of the defined types were placed in this encounter.   No follow-ups on file.   The patient was advised to call back or seek an in-person evaluation if the symptoms worsen or if the condition fails to improve as anticipated.  I discussed the assessment  and treatment plan with the patient. The patient was provided an opportunity to ask questions and all were answered. The patient agreed with the plan and demonstrated an understanding of the instructions.  I, Lillien Petronio, PA-C have reviewed all documentation for this visit. The documentation on 10/11/2023  for the exam, diagnosis, procedures, and orders are all accurate and complete.  Blane Bunting, [facility], MMS [facility] [phone] (phone) [phone] (fax)  [facility]

## 2023-10-11 ENCOUNTER — Ambulatory Visit: Admitting: Physician Assistant

## 2023-10-11 DIAGNOSIS — F32A Depression, unspecified: Secondary | ICD-10-CM

## 2023-10-11 DIAGNOSIS — F419 Anxiety disorder, unspecified: Secondary | ICD-10-CM

## 2023-10-11 DIAGNOSIS — F909 Attention-deficit hyperactivity disorder, unspecified type: Secondary | ICD-10-CM

## 2023-10-11 DIAGNOSIS — G43109 Migraine with aura, not intractable, without status migrainosus: Secondary | ICD-10-CM

## 2023-10-14 ENCOUNTER — Telehealth: Payer: Self-pay

## 2023-10-14 NOTE — Telephone Encounter (Unsigned)
 Copied from CRM [id number]. Topic: Clinical - Medication Question >> Oct 11, 2023  8:10 AM Delon HERO wrote: Reason for CRM: Patient is calling to reschedule her 3 month follow up for today. Next soonest appointment is 7//31/25. Patient would like to check with Janna to see if that is ok- consider Janna does 3 month medication follow up office visits. Please advise

## 2023-10-15 ENCOUNTER — Inpatient Hospital Stay: Attending: Licensed Clinical Social Worker | Admitting: Licensed Clinical Social Worker

## 2023-10-15 ENCOUNTER — Inpatient Hospital Stay

## 2023-10-15 ENCOUNTER — Encounter: Payer: Self-pay | Admitting: Licensed Clinical Social Worker

## 2023-10-15 DIAGNOSIS — Z800 Family history of malignant neoplasm of digestive organs: Secondary | ICD-10-CM | POA: Diagnosis not present

## 2023-10-15 DIAGNOSIS — Z8042 Family history of malignant neoplasm of prostate: Secondary | ICD-10-CM

## 2023-10-15 DIAGNOSIS — Z8041 Family history of malignant neoplasm of ovary: Secondary | ICD-10-CM

## 2023-10-15 DIAGNOSIS — Z1379 Encounter for other screening for genetic and chromosomal anomalies: Secondary | ICD-10-CM | POA: Insufficient documentation

## 2023-10-15 NOTE — Progress Notes (Signed)
 REFERRING PROVIDER: Ostwalt, Janna, PA-C [address] [place],  [place] [address]  PRIMARY PROVIDER:  Dineen Channel, PA-C  PRIMARY REASON FOR VISIT:  1. Family history of colon cancer   2. Family history of ovarian cancer   3. Family history of prostate cancer      HISTORY OF PRESENT ILLNESS:   Eileen Davis, a 33 y.o. female, was seen for a [place] cancer genetics consultation at the request of Eileen Ostwalt, PA-C due to a family history of cancer.  Eileen Davis presents to clinic today to discuss the possibility of a hereditary predisposition to cancer, genetic testing, and to further clarify her future cancer risks, as well as potential cancer risks for family members.   CANCER HISTORY:  Eileen Davis is a 33 y.o. female with no personal history of cancer.    RISK FACTORS:  Menarche was at age 65.  Ovaries intact: yes.  Hysterectomy: no.  Menopausal status: premenopausal.  Colonoscopy: no; not examined. Mammogram within the last year: n/a. Number of breast biopsies: 0. Up to date with pelvic exams: yes.   Past Medical History:  Diagnosis Date   ADHD    Anxiety and depression    Depression    Migraine    Reactive airway disease 06/08/2022   Recurrent UTI    Ureteral reflux 2005   Right side    Past Surgical History:  Procedure Laterality Date   CERVICAL BIOPSY  W/ LOOP ELECTRODE EXCISION     WISDOM TOOTH EXTRACTION  06/2016    FAMILY HISTORY:  We obtained a detailed, 4-generation family history.  Significant diagnoses are listed below: Family History  Problem Relation Age of Onset   Osteoporosis Mother    Colon cancer Mother 74       neg genetic testing   Diabetes Father    Hypertension Father    Skin cancer Father    ADD / ADHD Brother    Brain cancer Paternal Uncle        d. 67s   Cancer Paternal Uncle        either prostate or colon   Ovarian cancer Maternal Grandmother        d. 33s-50s   Prostate cancer Maternal Grandfather        d. 48s    Congestive Heart Failure Paternal Grandmother    CVA Paternal Grandmother    Lung cancer Paternal Grandfather        d.>50   Eileen Davis has 1 brother, 73.   Eileen Davis mother was diagnosed with colon cancer at 58 and passed of it at 60. She underwent genetic testing in 2022 that was negative/normal. Patient's maternal grandmother died of ovarian cancer in her 61s-50s. Patient's maternal grandfather died of prostate cancer in his 32s.  Eileen Davis father has had skin cancer. Patient has 1 paternal uncle who died of brain cancer in his 10s, another uncle who died of either prostate or colon cancer. She has a first cousin who died of brain cancer in her 30s. Patient's paternal grandfather had lung cancer.  Eileen Davis is unaware of previous family history of genetic testing for hereditary cancer risks. There is no reported Ashkenazi Jewish ancestry. There is no known consanguinity.    GENETIC COUNSELING ASSESSMENT: [name] is a 33 y.o. female with a family history of colon and ovarian cancer which is somewhat suggestive of a hereditary cancer syndrome and predisposition to cancer. She recently underwent genetic testing through her OB/GYN that was negative/normal.  We, therefore, discussed and recommended the following at today's visit.   DISCUSSION: We discussed that approximately 10% of cancer is hereditary. Most cases of hereditary colorectal cancer are associated with Lynch syndrome genes which also increase the risk for ovarian cancer and uterine cancer,  although there are other genes associated with hereditary cancer as well. Cancers and risks are gene specific. We discussed that testing is beneficial for several reasons including knowing about cancer risks, identifying potential screening and risk-reduction options that may be appropriate, and to understand if other family members could be at risk for cancer and allow them to undergo genetic testing.   We reviewed the characteristics, features  and inheritance patterns of hereditary cancer syndromes. We also discussed genetic testing, including the appropriate family members to test, the process of testing, insurance coverage and turn-around-time for results. We discussed the implications of a negative, positive and/or variant of uncertain significant result. Eileen Davis underwent genetic testing through Dr. Kandyce at [facility]. Eileen Davis brought a copy of her test report with her on her phone. Her test was the [facility] Comprehensive Panel through [facility].   Genes included: AIP, ALK, APC, ATM, AXIN2, BAP1, BARD1, BMPR1A, BRCA1, BRCA2, BRIP1, CDC73, CDH1, CDK4, CDKN1B, CDKN1C, CDKN2A, CEBPA, CHEK2, CYLD, DDX41, DICER1, EGFR, EPCAM, EXT1, EXT2, FH, FLCN, GATA2, GREM1, HOXB13, KIT, LZTR1, MAX, MEN1, MET, MITF, MLH1, MSH2, MSH3, MSH6, MUTYH, NBN, NF1, NF2, NTHL1, PALB2, PDGFRA, PHOX2B, PMS2, POLD1, POLE, POT1, PRKAR1A, PTCH1, PTEN, RAD51C, RAD51D, RB1, RET, RHBDF2, RUNX1, SDHA, SDHAF2, SDHB, SDHC, SDHD, SMAD4, SMARCA4, SMARCB1, SMARCE1, STK11, SUFU, TERC, TERT, TMEM127, TP53, TSC1, TSC2, VHL, WT1.  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 At this time, it is unknown if this variant is associated with an increased risk for cancer or if it is benign, but most uncertain variants are reclassified to benign. It should not be used to make medical management decisions. With time, we suspect the laboratory will determine the significance of this variant, if any. If the laboratory reclassifies this variant, we will attempt to contact Eileen Davis to discuss it further.   Even though a pathogenic variant was not identified, possible explanations for the cancer in the family may include: There may be no hereditary risk for cancer in the family. The cancers in Eileen Davis and/or her family may be sporadic/familial or due to other genetic and environmental factors. There may be a gene mutation in one of these genes that current testing methods cannot detect  but that chance is small. There could be another gene that has not yet been discovered, or that we have not yet tested, that is responsible for the cancer diagnoses in the family.  It is also possible there is a hereditary cause for the cancer in the family that Eileen Davis did not inherit.  Therefore, it is important to remain in touch with cancer genetics in the future so that we can continue to offer Eileen Davis the most up to date genetic testing.   ADDITIONAL GENETIC TESTING:  We discussed with Eileen Davis that her genetic testing was fairly extensive.  If there are additional relevant genes identified to increase cancer risk that can be analyzed in the future, we would be happy to discuss and coordinate this testing at that time.    CANCER SCREENING RECOMMENDATIONS:  Eileen Davis test result is considered negative (normal).  This means that we have not identified a hereditary cause for her family history of cancer at this time.  An individual's cancer risk and medical management are not determined by genetic test results alone. Overall cancer risk assessment incorporates additional factors, including personal medical history, family history, and any available genetic information that may result in a personalized plan for cancer prevention and surveillance. Therefore, it is recommended she continue to follow the cancer management and screening guidelines provided by her primary healthcare provider.  Based on the reported personal and family history, specific cancer screenings for Eileen Davis and her family include:  Colon Cancer Screening: Due to Eileen Davis's family's history of colon cancer in her mother, she is recommended to repeat colonoscopies at least every 5 years starting at 52 or 10 years before earliest colorectal cancer diagnosis. More frequent colonoscopies may be recommended if polyps are identified.  RECOMMENDATIONS FOR FAMILY MEMBERS:   Individuals in this family might be at  some increased risk of developing cancer, over the general population risk, due to the family history of cancer.  Individuals in the family should notify their providers of the family history of cancer. We recommend women in this family have a yearly mammogram beginning at age 52, or 50 years younger than the earliest onset of cancer, an annual clinical breast exam, and perform monthly breast self-exams.  Family members should have colonoscopies by at age 65, or earlier, as recommended by their providers. Other members of the family may still carry a pathogenic variant in one of these genes that Eileen Davis did not inherit. Based on the family history, we recommend her maternal relatives (those related to her grandmother with ovarian cancer) consider comprehensive genetic testing.  We do not recommend familial testing for the VHL variant of uncertain significance (VUS).  FOLLOW-UP:  Lastly, we discussed with Ms. Furuya that cancer genetics is a rapidly advancing field and it is possible that new genetic tests will be appropriate for her and/or her family members in the future. We encouraged her to remain in contact with cancer genetics on an annual basis so we can update her personal and family histories and let her know of advances in cancer genetics that may benefit this family.   Our contact number was provided. Ms. Bartnick questions were answered to her satisfaction, and she knows she is welcome to call us  at anytime with additional questions or concerns.   Dena Cary, MS, [facility] Genetic Counselor [email or web address] Phone: [phone]  45 minutes were spent on the date of the encounter in service to the patient including preparation, face-to-face consultation, documentation and care coordination. Dr. Delinda was available for discussion regarding this case.   _______________________________________________________________________ For Office Staff:  Number of people involved  in session: 1 Was an Intern/ student involved with case: no

## 2023-10-25 NOTE — Progress Notes (Signed)
 Subjective:   Chief Complaint  Patient presents with  . Urinary Tract Infection    Pt reports pain with urination that started yesterday. Pt states the pain has now moved into her back in her kidney areas. Pt reports frequent hx of UTI and resistance to antibiotics. No medication prior to arrival. Denies fever. Pt states she has been hydrating.      History of Present Illness This is a 33 year old female with a history of pyelonephritis presenting with dysuria and right-sided flank pain.   The patient reports experiencing right flank pain that began yesterday morning. Initially, she noticed mild pain during urination and attributed it to dehydration or cystitis. She attempted to alleviate the symptoms by increasing her water intake, which provided temporary relief. However, the pain persisted and localized to the kidney area, radiating downward during urination. She describes the pain as significant and suspects a urinary tract infection due to history of recurrent infections. The patient reports no blood in her urine, no fever, and no body aches, but she does experience chills and mild nausea without vomiting. She is able to maintain adequate fluid intake.   The patient has a history of a kidney infection in January 2021, which required hospitalization due to an elevated white blood cell count of 21. She was treated with oral and intravenous antibiotics.  Her last urinary tract infection was in April 2025.  On review of culture, she was resistant to several oral antibiotics limiting selection to Macrobid  which was intermediate.  She does note improvement with this medication.  She has followed with urology in the past.   Parts of patient history reviewed include PMH, problem list, medications, allergies, and social history.  Objective:   Vitals:   10/25/23 0740  BP: (!) 131/95  BP Location: Right arm  Patient Position: Sitting  Pulse: 79  Resp: 18  Temp: 98.1 F (36.7 C)  TempSrc:  Oral  SpO2: 100%  Weight: 60.8 kg (134 lb)  Height: 1.549 m (5' 1)    Physical Exam Vitals and nursing note reviewed.  Constitutional:      General: She is not in acute distress.    Appearance: Normal appearance. She is normal weight. She is not ill-appearing or toxic-appearing.  HENT:     Head: Normocephalic and atraumatic.   Cardiovascular:     Pulses: Normal pulses.  Pulmonary:     Effort: Pulmonary effort is normal.  Abdominal:     General: Abdomen is flat. Bowel sounds are normal. There is no distension.     Palpations: Abdomen is soft.     Tenderness: There is abdominal tenderness in the suprapubic area. There is right CVA tenderness. There is no left CVA tenderness, guarding or rebound.   Musculoskeletal:     Cervical back: Normal range of motion and neck supple.   Skin:    General: Skin is warm.     Capillary Refill: Capillary refill takes less than 2 seconds.   Neurological:     General: No focal deficit present.     Mental Status: She is alert and oriented to person, place, and time. Mental status is at baseline.     Results for orders placed or performed in visit on 10/25/23  POC Urinalysis Auto without Microscopic   Collection Time: 10/25/23  7:46 AM  Result Value Ref Range   Color, Urine Yellow Yellow   Clarity, Urine Cloudy (A) Clear   Glucose, Urine Negative Negative mg/dL   Bilirubin, Urine Negative Negative  Ketones, Urine Negative Negative mg/dL   Specific Gravity, Urine 1.015 1.010, 1.015, 1.020, 1.025   Blood, Urine Moderate (A) Negative   pH, Urine 7.0 5.0, 5.5, 6.0, 6.5, 7.0, 7.5, 8.0   Protein, Urine Negative Negative mg/dL   Urobilinogen, Urine 0.2 <2.0 mg/dL   Nitrite, Urine Negative Negative   Leukocyte Esterase, Urine Small (A) Negative   Kit/Device Lot # [id number]    Kit/Device Expiration Date 63026     Lab work reviewed and incorporated into the decision making process    Assessment/Plan:   Assessment & Plan Acute Cystitis with  hematuria  Flank pain  Recurrent UTI  - Urine culture - Referral to urology - Fosfomycin 3 g once every 3 days for 3 doses  This is a 33 year old female presenting with dysuria and flank pain ongoing for 1 day.  History of recurrent UTI and pyelonephritis requiring hospitalization.  She is denying fevers, chills, nausea, vomiting.  She is well-appearing on physical exam, no evidence of toxicity.  Vital signs are unremarkable, she is afebrile and not tachycardic.  She is tolerating p.o. fluids.  Last UTI was April 2025 treated with Macrobid .  On review of culture and sensitivity, resistance to several antibiotics limiting selection.  Plan is to trial fosfomycin 3 g every 3 days for 3 doses.  I emphasized to the patient risk of worsening illness including signs and symptoms of pyelonephritis necessitating urgent ED evaluation.  There is a risk oral antibiotics may not be sufficient due to history of resistance which would require IV medications.  She verbalized understanding of this.  Encouraged her to push oral fluids.  Referral placed to urology for history of recurrent UTI.  Disposition: Home, monitor symptoms, seek hospital care if worsens. Follow-Up: Referral to urologist.   Urgent Follow Up with Specialist   Patient has been instructed on medications, dosages, side effects, and possible interactions as associated with each diagnosis in my impression and plan above. Patient education (verbal/handout) given on diagnosis, pathophysiology, treatment of diagnosis, side effects of medication use for treatment, restrictions while taking medication, and supportive measures.   Patient was instructed on when to follow up and know that they can follow up here, with their PCP, Urgent Care, ED.  They have been instructed that if symptoms worsen that should return to the clinic, go to the nearest ED, or activate EMS. Red Flags associated with their diagnoses were reviewed and patient was educated on what to  do if red.       Patient agreed with plan and voiced understanding.  No barriers to adherence perceived by myself.  Portions of this note may have been dictated using Dragon dictation software/hardware and may contain grammatical or spelling errors.   Electronically signed by:   Sotero Pore, DNP ENP-C FNP-C [facility]  10/25/2023 8:12 AM

## 2023-10-25 NOTE — Telephone Encounter (Signed)
 S:  Pharmacy is closed, please sent rx to [facility]/[place]  B:  Seen today and prescribed Fosfomycin  A:  No changes in condition  R:  Medication cancelled and resent to [facility] pharmacy/24 hr due to holiday schedule.  Nothing further needed from [facility] care team.

## 2023-11-04 ENCOUNTER — Encounter: Payer: Self-pay | Admitting: Physician Assistant

## 2023-11-04 DIAGNOSIS — F909 Attention-deficit hyperactivity disorder, unspecified type: Secondary | ICD-10-CM

## 2023-11-05 MED ORDER — AMPHETAMINE-DEXTROAMPHET ER 20 MG PO CP24: 20.0000 mg | ORAL_CAPSULE | Freq: Every day | ORAL | 0 refills | Status: DC

## 2023-11-09 ENCOUNTER — Other Ambulatory Visit: Payer: Self-pay | Admitting: Medical Genetics

## 2023-11-13 ENCOUNTER — Other Ambulatory Visit (HOSPITAL_COMMUNITY)

## 2023-11-15 ENCOUNTER — Other Ambulatory Visit (HOSPITAL_COMMUNITY)
Admission: RE | Admit: 2023-11-15 | Discharge: 2023-11-15 | Disposition: A | Discharge status: Home / Self Care | Payer: Self-pay | Source: Ambulatory Visit | Attending: Medical Genetics | Admitting: Medical Genetics

## 2023-11-21 ENCOUNTER — Ambulatory Visit: Admitting: Physician Assistant

## 2023-11-29 LAB — GENECONNECT MOLECULAR SCREEN: Genetic Analysis Overall Interpretation: NEGATIVE

## 2024-01-01 NOTE — Progress Notes (Unsigned)
 Established patient visit  Patient: Eileen Davis   DOB: Aug 17, 1990   33 y.o. Female  MRN: [id number] Visit Date: 01/02/2024  Today's healthcare provider: Jolynn Spencer, PA-C   No chief complaint on file.  Subjective       Discussed the use of AI scribe software for clinical note transcription with the patient, who gave verbal consent to proceed.  History of Present Illness JADWIGA Davis is a 33 year old female who presents with concerns about medication management and a recent urinary tract infection.  She takes propranolol  and Zoloft  for depression and anxiety. 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 She takes Zoloft  in the evening but struggles to balance it with trazodone  on days off. She adheres to prescribed doses and is cautious about medication changes.  She was diagnosed with a urinary tract infection on October 25, 2023, and treated with fosfomycin, which resolved her symptoms. She currently has no UTI symptoms, urinary incontinence, or frequent urination.  She uses Nexplanon  for contraception, with the current implant in place and palpable. 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 She takes Adderall as needed for work-related concentration, avoiding daily use to manage OCD symptoms. She has a latex allergy but no issues with Tdap vaccinations.      01/02/2024    9:57 AM 06/20/2023    8:23 AM 08/16/2022    1:42 PM  PHQ9 SCORE ONLY  PHQ-9 Total Score 4 2  1       Data saved with a previous flowsheet row definition      01/02/2024    9:57 AM 06/20/2023    8:23 AM 05/24/2022    4:05 PM 05/13/2019    1:40 PM  GAD 7 : Generalized Anxiety Score  Nervous, Anxious, on Edge 1 0 1 0  Control/stop worrying 0 0 1 0  Worry too much - different things 0 0 1 0  Trouble relaxing 0 0 1 1  Restless 0 0 0 0  Easily annoyed or irritable 0 0 0 0  Afraid - awful might happen 0 0 1 0  Total GAD 7 Score 1 0 5 1  Anxiety  Difficulty Somewhat difficult  Somewhat difficult Somewhat difficult    prop    06/20/2023    8:23 AM 08/16/2022    1:42 PM 07/26/2022   11:01 AM  Depression screen PHQ 2/9  Decreased Interest 0 0 1  Down, Depressed, Hopeless 0 0 0  PHQ - 2 Score 0 0 1  Altered sleeping 1 1 2   Tired, decreased energy 1 0 1  Change in appetite 0 0 1  Feeling bad or failure about yourself  0 0 0  Trouble concentrating 0 0 0  Moving slowly or fidgety/restless 0 0 2  Suicidal thoughts 0 0 0  PHQ-9 Score 2 1 7   Difficult doing work/chores Somewhat difficult Not difficult at all Very difficult      06/20/2023    8:23 AM 05/24/2022    4:05 PM 05/13/2019    1:40 PM  GAD 7 : Generalized Anxiety Score  Nervous, Anxious, on Edge 0 1 0  Control/stop worrying 0 1 0  Worry too much - different things 0 1 0  Trouble relaxing 0 1 1  Restless 0 0 0  Easily annoyed or irritable 0 0 0  Afraid - awful might happen 0 1 0  Total GAD 7 Score 0 5 1  Anxiety Difficulty  Somewhat difficult Somewhat difficult    Medications: Outpatient Medications Prior to Visit  Medication Sig   amphetamine -dextroamphetamine  (ADDERALL XR) 20 MG 24 hr capsule Take 1 capsule (20 mg total) by mouth daily.   etonogestrel  (NEXPLANON ) 68 MG IMPL implant 1 each by Subdermal route once. Inserted 08/24/2020 by Archie Savers, MD [facility] [place]   propranolol  (INDERAL ) 40 MG tablet Take 1 tablet (40 mg total) by mouth 2 (two) times daily. Take one pill twice daily.   sertraline  (ZOLOFT ) 100 MG tablet Take 1 tablet (100 mg total) by mouth daily.   traZODone  (DESYREL ) 50 MG tablet Take 0.5-1 tablets (25-50 mg total) by mouth at bedtime as needed for sleep.   No facility-administered medications prior to visit.    Review of Systems  All other systems reviewed and are negative.  All negative Except see HPI       Objective    There were no vitals taken for this visit.    Physical Exam Vitals reviewed.  Constitutional:       General: She is not in acute distress.    Appearance: Normal appearance. She is well-developed. She is not diaphoretic.  HENT:     Head: Normocephalic and atraumatic.  Eyes:     General: No scleral icterus.    Conjunctiva/sclera: Conjunctivae normal.  Neck:     Thyroid: No thyromegaly.  Cardiovascular:     Rate and Rhythm: Normal rate and regular rhythm.     Pulses: Normal pulses.     Heart sounds: Normal heart sounds. No murmur heard. Pulmonary:     Effort: Pulmonary effort is normal. No respiratory distress.     Breath sounds: Normal breath sounds. No wheezing, rhonchi or rales.  Musculoskeletal:     Cervical back: Neck supple.     Right lower leg: No edema.     Left lower leg: No edema.  Lymphadenopathy:     Cervical: No cervical adenopathy.  Skin:    General: Skin is warm and dry.     Findings: No rash.  Neurological:     Mental Status: She is alert and oriented to person, place, and time. Mental status is at baseline.  Psychiatric:        Mood and Affect: Mood normal.        Behavior: Behavior normal.      No results found for any visits on 01/02/24.      Assessment & Plan Major depressive disorder and anxiety disorder Chronic Managed with propranolol  and Zoloft . Difficulty with sleep due to night shifts and medication interaction. Hesitant to change effective regimen. - Refer to in-clinic counseling for therapy and medication review. - Continue propranolol  and Zoloft . - Advise against concurrent use of trazodone  and Zoloft ; if needed, take half trazodone  and monitor.  Collaboration of Care: Medication Management AEB  , Primary Care Provider AEB  , Psychiatrist AEB  , and Referral or follow-up with counselor/therapist AEB    Patient/Guardian was advised Release of Information must be obtained prior to any record release in order to collaborate their care with an outside provider. Patient/Guardian was advised if they have not already done so to contact the  registration department to sign all necessary forms in order for us  to release information regarding their care.   Consent: Patient/Guardian gives verbal consent for treatment and assignment of benefits for services provided during this visit. Patient/Guardian expressed understanding and agreed to proceed.   Attention-deficit hyperactivity disorder Chronic Managed with as-needed Adderall to prevent dependency and  manage OCD symptoms. - Refill Adderall prescription. - Schedule follow-up every three months.  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 Requires surgical removal. - Advise contacting OB GYN to expedite surgical referral.  General Health Maintenance Due for tetanus shot. Latex allergy is skin-related, not interfering with Tdap. - Administer Tdap vaccine. - Request medical records from [facility].   Anxiety and depression  - Amb ref to [facility]  Attention deficit hyperactivity disorder (ADHD), unspecified ADHD type  - Amb ref to [facility] - Ambulatory referral to Urology - amphetamine -dextroamphetamine  (ADDERALL XR) 20 MG 24 hr capsule; Take 1 capsule (20 mg total) by mouth daily.  Dispense: 30 capsule; Refill: 0  Immunization due  - Tdap vaccine greater than or equal to 7yo IM  No orders of the defined types were placed in this encounter.   No follow-ups on file.   The patient was advised to call back or seek an in-person evaluation if the symptoms worsen or if the condition fails to improve as anticipated.  I discussed the assessment and treatment plan with the patient. The patient was provided an opportunity to ask questions and all were answered. The patient agreed with the plan and demonstrated an understanding of the instructions.  I, Huntley Knoop, PA-C have reviewed all documentation for this visit. The documentation on 01/02/2024  for the exam, diagnosis, procedures, and  orders are all accurate and complete.  Jolynn Spencer, [facility], MMS [facility] [phone] (phone) [phone] (fax)  [facility]

## 2024-01-02 ENCOUNTER — Encounter: Payer: Self-pay | Admitting: Physician Assistant

## 2024-01-02 ENCOUNTER — Ambulatory Visit: Admitting: Physician Assistant

## 2024-01-02 VITALS — BP 114/88 | HR 83 | Resp 14 | Ht 62.0 in | Wt 132.7 lb

## 2024-01-02 DIAGNOSIS — Z3046 Encounter for surveillance of implantable subdermal contraceptive: Secondary | ICD-10-CM | POA: Diagnosis not present

## 2024-01-02 DIAGNOSIS — Z23 Encounter for immunization: Secondary | ICD-10-CM | POA: Diagnosis not present

## 2024-01-02 DIAGNOSIS — F419 Anxiety disorder, unspecified: Secondary | ICD-10-CM | POA: Diagnosis not present

## 2024-01-02 DIAGNOSIS — F909 Attention-deficit hyperactivity disorder, unspecified type: Secondary | ICD-10-CM | POA: Diagnosis not present

## 2024-01-02 DIAGNOSIS — F32A Depression, unspecified: Secondary | ICD-10-CM

## 2024-01-02 DIAGNOSIS — G43109 Migraine with aura, not intractable, without status migrainosus: Secondary | ICD-10-CM

## 2024-01-02 MED ORDER — AMPHETAMINE-DEXTROAMPHET ER 20 MG PO CP24: 20.0000 mg | ORAL_CAPSULE | Freq: Every day | ORAL | 0 refills | Status: DC

## 2024-01-20 ENCOUNTER — Encounter: Payer: Self-pay | Admitting: Physician Assistant

## 2024-01-22 ENCOUNTER — Telehealth: Payer: Self-pay

## 2024-01-22 NOTE — Telephone Encounter (Unsigned)
 Copied from CRM [id number]. Topic: General - Other >> Jan 21, 2024 10:43 AM Carlatta H wrote: Reason for CRM: Patient stated she received a call from the office//Please call back if needed

## 2024-01-24 NOTE — Telephone Encounter (Signed)
 Forms filled out and printed and left at your desk

## 2024-01-24 NOTE — Telephone Encounter (Signed)
 Unable to see that anyone called patient

## 2024-03-04 ENCOUNTER — Other Ambulatory Visit: Payer: Self-pay

## 2024-03-04 ENCOUNTER — Encounter: Payer: Self-pay | Admitting: Physician Assistant

## 2024-03-04 DIAGNOSIS — F909 Attention-deficit hyperactivity disorder, unspecified type: Secondary | ICD-10-CM

## 2024-03-04 MED ORDER — AMPHETAMINE-DEXTROAMPHET ER 20 MG PO CP24: 20.0000 mg | ORAL_CAPSULE | Freq: Every day | ORAL | 0 refills | Status: DC

## 2024-03-04 NOTE — Telephone Encounter (Signed)
 LOV 01/02/24 NOV 04/02/24 LRF 01/02/24 30 x 0

## 2024-04-02 ENCOUNTER — Ambulatory Visit: Admitting: Physician Assistant

## 2024-05-06 ENCOUNTER — Other Ambulatory Visit: Payer: Self-pay | Admitting: Physician Assistant

## 2024-05-06 DIAGNOSIS — F909 Attention-deficit hyperactivity disorder, unspecified type: Secondary | ICD-10-CM

## 2024-05-06 NOTE — Telephone Encounter (Signed)
 Copied from CRM [id number]. Topic: Clinical - Medication Refill >> May 06, 2024 11:12 AM Thersia C wrote: Medication: amphetamine -dextroamphetamine  (ADDERALL XR) 20 MG 24 hr capsule   Has the patient contacted their pharmacy? Yes (Agent: If no, request that the patient contact the pharmacy for the refill. If patient does not wish to contact the pharmacy document the reason why and proceed with request.) (Agent: If yes, when and what did the pharmacy advise?)  This is the patient's preferred pharmacy:  [id number] - [place], [place] - [address] AT [facility] OF [address] [place] [address] Phone: [phone] Fax: [phone]  Is this the correct pharmacy for this prescription? Yes If no, delete pharmacy and type the correct one.   Has the prescription been filled recently? No  Is the patient out of the medication? Yes  Has the patient been seen for an appointment in the last year OR does the patient have an upcoming appointment? Yes  Can we respond through MyChart? Yes  Agent: Please be advised that Rx refills may take up to 3 business days. We ask that you follow-up with your pharmacy.

## 2024-05-07 NOTE — Telephone Encounter (Signed)
 LOV- 01/02/2024 NOV- None LRF- 03/04/2024 Outpatient Medication Detail   Disp Refills Start End   amphetamine -dextroamphetamine  (ADDERALL XR) 20 MG 24 hr capsule 30 capsule 0 03/04/2024 --   Sig - Route: Take 1 capsule (20 mg total) by mouth daily. - Oral   Sent to pharmacy as: amphetamine -dextroamphetamine  (ADDERALL XR) 20 MG 24 hr capsule   Earliest Fill Date: 03/04/2024   E-Prescribing Status: Receipt confirmed by pharmacy (03/04/2024  5:18 PM EST)

## 2024-05-07 NOTE — Telephone Encounter (Signed)
 Requested medication (s) are due for refill today -yes  Requested medication (s) are on the active medication list -yes  Future visit scheduled -no  Last refill: 03/04/24 #30  Notes to clinic: non delegated Rx  Requested Prescriptions  Pending Prescriptions Disp Refills   amphetamine -dextroamphetamine  (ADDERALL XR) 20 MG 24 hr capsule 30 capsule 0    Sig: Take 1 capsule (20 mg total) by mouth daily.     Not Delegated - Psychiatry:  Stimulants/ADHD Failed - 05/07/2024 12:35 PM      Failed - This refill cannot be delegated      Failed - Urine Drug Screen completed in last 360 days      Passed - Last BP in normal range    BP Readings from Last 1 Encounters:  01/02/24 114/88         Passed - Last Heart Rate in normal range    Pulse Readings from Last 1 Encounters:  01/02/24 83         Passed - Valid encounter within last 6 months    Recent Outpatient Visits           4 months ago Nexplanon  removal   [place] [facility] [place], [place], PA-C   10 months ago Attention deficit hyperactivity disorder (ADHD), unspecified ADHD type   [place] [facility] [place], [place], PA-C                 Requested Prescriptions  Pending Prescriptions Disp Refills   amphetamine -dextroamphetamine  (ADDERALL XR) 20 MG 24 hr capsule 30 capsule 0    Sig: Take 1 capsule (20 mg total) by mouth daily.     Not Delegated - Psychiatry:  Stimulants/ADHD Failed - 05/07/2024 12:35 PM      Failed - This refill cannot be delegated      Failed - Urine Drug Screen completed in last 360 days      Passed - Last BP in normal range    BP Readings from Last 1 Encounters:  01/02/24 114/88         Passed - Last Heart Rate in normal range    Pulse Readings from Last 1 Encounters:  01/02/24 83         Passed - Valid encounter within last 6 months    Recent Outpatient Visits           4 months ago Nexplanon  removal   [facility]  [place], [place], PA-C   10 months ago Attention deficit hyperactivity disorder (ADHD), unspecified ADHD type   [facility] [place], J[place], PA-C

## 2024-05-12 MED ORDER — AMPHETAMINE-DEXTROAMPHET ER 20 MG PO CP24: 20.0000 mg | ORAL_CAPSULE | Freq: Every day | ORAL | 0 refills | Status: AC
